# Patient Record
Sex: Male | Born: 1975 | Race: White | Hispanic: No | Marital: Married | Smoking: Never smoker
Health system: Southern US, Community
[De-identification: ages and names within clinical notes are randomized; demographics above are authoritative.]

## PROBLEM LIST (undated history)

## (undated) DIAGNOSIS — N200 Calculus of kidney: Secondary | ICD-10-CM

## (undated) DIAGNOSIS — F419 Anxiety disorder, unspecified: Secondary | ICD-10-CM

## (undated) DIAGNOSIS — F32A Depression, unspecified: Secondary | ICD-10-CM

## (undated) DIAGNOSIS — F329 Major depressive disorder, single episode, unspecified: Secondary | ICD-10-CM

## (undated) DIAGNOSIS — Z87442 Personal history of urinary calculi: Secondary | ICD-10-CM

## (undated) HISTORY — DX: Anxiety disorder, unspecified: F41.9

## (undated) HISTORY — DX: Major depressive disorder, single episode, unspecified: F32.9

## (undated) HISTORY — DX: Depression, unspecified: F32.A

---

## 2003-05-11 ENCOUNTER — Emergency Department (HOSPITAL_COMMUNITY): Admission: EM | Admit: 2003-05-11 | Discharge: 2003-05-11 | Payer: Self-pay | Admitting: Emergency Medicine

## 2005-07-17 ENCOUNTER — Ambulatory Visit: Payer: Self-pay | Admitting: Orthopedic Surgery

## 2007-07-12 ENCOUNTER — Other Ambulatory Visit: Payer: Self-pay

## 2007-07-13 ENCOUNTER — Other Ambulatory Visit: Payer: Self-pay

## 2007-07-13 ENCOUNTER — Inpatient Hospital Stay: Payer: Self-pay | Admitting: Unknown Physician Specialty

## 2008-04-03 ENCOUNTER — Inpatient Hospital Stay: Payer: Self-pay | Admitting: Psychiatry

## 2013-09-14 ENCOUNTER — Ambulatory Visit: Payer: Self-pay | Admitting: Gastroenterology

## 2014-08-01 ENCOUNTER — Encounter (HOSPITAL_COMMUNITY): Payer: Self-pay | Admitting: Emergency Medicine

## 2014-08-01 ENCOUNTER — Emergency Department (HOSPITAL_COMMUNITY)
Admission: EM | Admit: 2014-08-01 | Discharge: 2014-08-01 | Disposition: A | Discharge status: Home / Self Care | Payer: BLUE CROSS/BLUE SHIELD | Attending: Emergency Medicine | Admitting: Emergency Medicine

## 2014-08-01 ENCOUNTER — Emergency Department (HOSPITAL_COMMUNITY): Payer: BLUE CROSS/BLUE SHIELD

## 2014-08-01 DIAGNOSIS — N201 Calculus of ureter: Secondary | ICD-10-CM

## 2014-08-01 DIAGNOSIS — R109 Unspecified abdominal pain: Secondary | ICD-10-CM

## 2014-08-01 DIAGNOSIS — N23 Unspecified renal colic: Secondary | ICD-10-CM | POA: Diagnosis not present

## 2014-08-01 HISTORY — DX: Calculus of kidney: N20.0

## 2014-08-01 LAB — URINALYSIS, ROUTINE W REFLEX MICROSCOPIC
GLUCOSE, UA: NEGATIVE mg/dL
Nitrite: POSITIVE — AB
PROTEIN: 100 mg/dL — AB
UROBILINOGEN UA: 0.2 mg/dL (ref 0.0–1.0)
pH: 5 (ref 5.0–8.0)

## 2014-08-01 LAB — URINE MICROSCOPIC-ADD ON

## 2014-08-01 MED ORDER — TAMSULOSIN HCL 0.4 MG PO CAPS: 0.4000 mg | ORAL_CAPSULE | Freq: Every day | ORAL | Status: DC

## 2014-08-01 MED ORDER — ONDANSETRON HCL 4 MG PO TABS: 4.0000 mg | ORAL_TABLET | Freq: Three times a day (TID) | ORAL | Status: DC | PRN

## 2014-08-01 MED ORDER — OXYCODONE-ACETAMINOPHEN 5-325 MG PO TABS: ORAL_TABLET | ORAL | Status: DC

## 2014-08-01 MED ORDER — NAPROXEN 250 MG PO TABS: 250.0000 mg | ORAL_TABLET | Freq: Two times a day (BID) | ORAL | Status: DC | PRN

## 2014-08-01 NOTE — ED Provider Notes (Signed)
CSN: [id number]     Arrival date & time 08/01/14  1350 History   First MD Initiated Contact with Patient 08/01/14 1623     Chief Complaint  Patient presents with  . Flank Pain      HPI  Pt was seen at 1625. Per pt, c/o sudden onset and persistence of waxing and waning left sided flank "pain" that began 1 to 2 weeks ago, worse this afternoon PTA.  Pt describes the pain as "like my last kidney stone," and radiating into the left side of his abd.  Has been associated with "dark urine." Denies testicular pain/swelling, no dysuria/hematuria, no abd pain, no diarrhea, no CP/SOB.    Past Medical History  Diagnosis Date  . Kidney stones    History reviewed. No pertinent past surgical history.  History  Substance Use Topics  . Smoking status: Never Smoker   . Smokeless tobacco: Current User    Types: Snuff  . Alcohol Use: 2.4 oz/week    4 Cans of beer per week    Review of Systems ROS: Statement: All systems negative except as marked or noted in the HPI; Constitutional: Negative for fever and chills. ; ; Eyes: Negative for eye pain, redness and discharge. ; ; ENMT: Negative for ear pain, hoarseness, nasal congestion, sinus pressure and sore throat. ; ; Cardiovascular: Negative for chest pain, palpitations, diaphoresis, dyspnea and peripheral edema. ; ; Respiratory: Negative for cough, wheezing and stridor. ; ; Gastrointestinal: Negative for nausea, vomiting, diarrhea, abdominal pain, blood in stool, hematemesis, jaundice and rectal bleeding. . ; ; Genitourinary: +"dark urine." Negative for dysuria, flank pain and hematuria. ; ; Genital:  No penile drainage or rash, no testicular pain or swelling, no scrotal rash or swelling. ;; Musculoskeletal: +LBP. Negative for neck pain. Negative for swelling and trauma.; ; Skin: Negative for pruritus, rash, abrasions, blisters, bruising and skin lesion.; ; Neuro: Negative for headache, lightheadedness and neck stiffness. Negative for weakness, altered level of  consciousness , altered mental status, extremity weakness, paresthesias, involuntary movement, seizure and syncope.     Allergies  Review of patient's allergies indicates no known allergies.  Home Medications   Prior to Admission medications   Not on File   BP 117/85 mmHg  Pulse 83  Temp(Src) 98.2 F (36.8 C) (Oral)  Resp 20  Ht 5\' 11"  (1.803 m)  Wt 210 lb (95.255 kg)  BMI 29.30 kg/m2  SpO2 97% Physical Exam  1630: Physical examination:  Nursing notes reviewed; Vital signs and O2 SAT reviewed;  Constitutional: Well developed, Well nourished, Well hydrated, In no acute distress; Head:  Normocephalic, atraumatic; Eyes: EOMI, PERRL, No scleral icterus; ENMT: Mouth and pharynx normal, Mucous membranes moist; Neck: Supple, Full range of motion, No lymphadenopathy; Cardiovascular: Regular rate and rhythm, No murmur, rub, or gallop; Respiratory: Breath sounds clear & equal bilaterally, No rales, rhonchi, wheezes.  Speaking full sentences with ease, Normal respiratory effort/excursion; Chest: Nontender, Movement normal; Abdomen: Soft, Nontender, Nondistended, Normal bowel sounds; Genitourinary: No CVA tenderness; Spine:  No midline CS, TS, LS tenderness. No rash.;; Extremities: Pulses normal, No tenderness, No edema, No calf edema or asymmetry.; Neuro: AA&Ox3, Major CN grossly intact.  Speech clear. No gross focal motor or sensory deficits in extremities. Climbs on and off stretcher easily by himself. Gait steady.; Skin: Color normal, Warm, Dry.   ED Course  Procedures     EKG Interpretation None      MDM  MDM Reviewed: previous chart, nursing note and vitals Reviewed  previous: labs and CT scan Interpretation: CT scan and labs   Results for orders placed or performed during the hospital encounter of 08/01/14  Urinalysis, Routine w reflex microscopic (not at [facility])  Result Value Ref Range   Color, Urine AMBER (A) YELLOW   APPearance HAZY (A) CLEAR   Specific Gravity, Urine >1.030  (H) 1.005 - 1.030   pH 5.0 5.0 - 8.0   Glucose, UA NEGATIVE NEGATIVE mg/dL   Hgb urine dipstick LARGE (A) NEGATIVE   Bilirubin Urine SMALL (A) NEGATIVE   Ketones, ur TRACE (A) NEGATIVE mg/dL   Protein, ur 100 (A) NEGATIVE mg/dL   Urobilinogen, UA 0.2 0.0 - 1.0 mg/dL   Nitrite POSITIVE (A) NEGATIVE   Leukocytes, UA TRACE (A) NEGATIVE  Urine microscopic-add on  Result Value Ref Range   Squamous Epithelial / LPF RARE RARE   WBC, UA 3-6 <3 WBC/hpf   RBC / HPF TOO NUMEROUS TO COUNT <3 RBC/hpf   Bacteria, UA MANY (A) RARE   Ct Renal Stone Study 08/01/2014   CLINICAL DATA:  LEFT flank pain off and on for 1 week, question passed a kidney stone today, pain improved, history of multiple kidney stones  EXAM: CT ABDOMEN AND PELVIS WITHOUT CONTRAST  TECHNIQUE: Multidetector CT imaging of the abdomen and pelvis was performed following the standard protocol without IV contrast. Sagittal and coronal MPR images reconstructed from axial data set. Oral contrast not administered for this indication.  COMPARISON:  05/11/2003  FINDINGS: Lung bases clear.  BILATERAL tiny nonobstructing renal calculi.  No hydronephrosis or significant hydroureter.  LEFT ureter appears similar in caliber to the RIGHT though a 2 mm diameter distal LEFT ureteral calculus is identified image 80.  Bladder and remaining ureters unremarkable.  Within limits of a nonenhanced exam no additional abnormalities of the liver, gallbladder, spleen, pancreas, kidneys, or adrenal glands.  Normal appendix.  Mild sigmoid diverticulosis without evidence of diverticulitis.  Stomach and bowel loops otherwise unremarkable for technique.  No mass, adenopathy, free air, free fluid, or acute osseous findings.  IMPRESSION: 2 mm distal LEFT ureteral calculus without significant hydroureteronephrosis.  Additional tiny BILATERAL nonobstructing renal calculi.  Mild sigmoid diverticulosis.   Electronically Signed   By: Lavonia Dana M.D.   On: 08/01/2014 17:20    1740:   Pt continues comfortable while in the ED. Denies need for pain med. Tx symptomatically at this time. Dx and testing d/w pt.  Questions answered.  Verb understanding, agreeable to d/c home with outpt f/u.    Francine Graven, DO 08/04/14 1523

## 2014-08-01 NOTE — Discharge Instructions (Signed)
°Emergency Department Resource Guide °1) Find a Doctor and Pay Out of Pocket °Although you won't have to find out who is covered by your insurance plan, it is a good idea to ask around and get recommendations. You will then need to call the office and see if the doctor you have chosen will accept you as a new patient and what types of options they offer for patients who are self-pay. Some doctors offer discounts or will set up payment plans for their patients who do not have insurance, but you will need to ask so you aren't surprised when you get to your appointment. ° °2) Contact Your Local Health Department °Not all health departments have doctors that can see patients for sick visits, but many do, so it is worth a call to see if yours does. If you don't know where your local health department is, you can check in your phone book. The CDC also has a tool to help you locate your state's health department, and many state websites also have listings of all of their local health departments. ° °3) Find a Walk-in Clinic °If your illness is not likely to be very severe or complicated, you may want to try a walk in clinic. These are popping up all over the country in pharmacies, drugstores, and shopping centers. They're usually staffed by nurse practitioners or physician assistants that have been trained to treat common illnesses and complaints. They're usually fairly quick and inexpensive. However, if you have serious medical issues or chronic medical problems, these are probably not your best option. ° °No Primary Care Doctor: °- Call [facility] at  832-8000 - they can help you locate a primary care doctor that  accepts your insurance, provides certain services, etc. °- Physician Referral Service- 1-800-533-3463 ° °Chronic Pain Problems: °Organization         Address  Phone   Notes  °[facility]  (336) [phone] Patients need to be referred by their primary care doctor.  ° °Medication  Assistance: °Organization         Address  Phone   Notes  °[place] [facility] [address] °[place], [place] [address] (336) 641-8030 --Must be a resident of [place] °-- Must have NO insurance coverage whatsoever (no Medicaid/ Medicare, etc.) °-- The pt. MUST have a primary care doctor that directs their care regularly and follows them in the community °  °[facility]  (866) 331-1348   °[employer]  (888) 892-1162   ° °Agencies that provide inexpensive medical care: °Organization         Address  Phone   Notes  °[facility]  (336) 832-8035   °[facility]    (336) 832-7272   °[facility] [address] °[place], [place] [address] (336) 832-4777   °[facility] [address], °[place] (336) 271-4999   °[facility]    (336) 373-0678   °[facility]    (336) 272-1050   °[facility] ° [address], [place] Phone:  (336) 832-4444, Fax:  (336) 832-4440 Hours of Operation:  9 am - 6 pm, M-F.  Also accepts Medicaid/Medicare and self-pay.  °[facility] ° [address], [place] Phone: (336) [phone], Fax: (336) [phone]. Hours of Operation:  8:30 am - 5:30 pm, M-F.  Also accepts Medicaid and self-pay.  °[facility] [place] [address], [place] Phone: (336) 878-6027   °[facility] [address], [place], [place] (336)723-1848, Ext. 123 Mondays & Thursdays: 7-9 AM.  First 15 patients are seen on a first come, first serve basis. °  ° °Medicaid-accepting [place] Providers: ° °Organization         Address  Phone   Notes  °[facility] [address], [place] (336) 641-2100 Also accepts self-pay patients.  °[facility] [address], [place] ° (336) [phone]   °[facility] [address], [place]  (336) 288-8857   °[facility] [address] [place] [address], [place] (336) 299-7000   °Veita Bland [address], [place]  ° (336) [phone] Only accepts [place] Access Medicaid patients after they have their name applied to their card.  ° °Self-Pay (no insurance) in [place]: ° °Organization         Address  Phone   Notes  °Sickle Cell Patients, [facility] [address], [place] (336) 832-1970   °[facility] [address], [place] (336) 832-4400   °[facility] ° [address], [place] (336) 992-4800   °[facility]/Dr. Osei-Bonsu ° [address] [place] [address], [place] or [address], [place] (336) [phone] Phone number for both [place] and [place] locations is the same.  °[facility] [address], [place] (336) 299-0000   °[facility] [address] [place] [address], [place] or [address] (336) 852-7530 °(336) 878-2260   °[facility] [address], [place] (336) 350-1642, phone; (336) 294-5005, fax Sees patients 1st and 3rd Saturday of every month.  Must not qualify for public or private insurance (i.e. Medicaid, Medicare, [place] Health Choice, Veterans' Benefits) • Household income should be no more than 200% of the poverty level •The clinic cannot treat you if you are pregnant or think you are pregnant • Sexually transmitted diseases are not treated at the clinic.  ° ° °Dental Care: °Organization         Address  Phone  Notes  °[place] [facility] [address], [place] (336) 641-6152 Accepts children up to age 21 who are enrolled in Medicaid or [place] Health Choice; pregnant women with a Medicaid card; and children who have applied for Medicaid or [place] Health Choice, but were declined, whose parents can pay a reduced fee at time of service.  °[place]  [facility] [place]  [address], [place] (336) 641-7733 Accepts children up to age 21 who are enrolled in Medicaid or [place] Health Choice; pregnant women with a Medicaid card; and children who have applied for Medicaid or [place] Health Choice, but were declined, whose parents can pay a reduced fee at time of service.  °[facility] ° [address], [place] (336) 641-4533 Patients are seen by appointment only. Walk-ins are not accepted. [facility] will see patients 18 years of age and older. °Monday - Tuesday (8am-5pm) °Most Wednesdays (8:30-5pm) °$30 per visit, cash only  °[facility] ° [address], [place] (336) [phone] Patients are seen by appointment only. Walk-ins are not accepted. [facility] will see patients 18 years of age and older. °One   Wednesday Evening (Monthly: Volunteer Based).  $30 per visit, cash only  °[facility]  (919) [phone] for adults; Children under age 4, call Graduate Pediatric Dentistry at ([phone]) [phone]. Children aged 4-14, please call ([phone]) [phone] to request a pediatric application. ° Dental services are provided in all areas of dental care including fillings, crowns and bridges, complete and partial dentures, implants, gum treatment, root canals, and extractions. Preventive care is also provided. Treatment is provided to both adults and children. °Patients are selected via a lottery and there is often a waiting list. °  °[facility] [address], °[place] ° (336) 763-8833 [email or web address] °  °[facility] [address], [place], [place] (336)723-1848, Ext. 123 Second and Fourth Thursday of each month, opens at 6:30 AM; Clinic ends at 9 AM.  Patients are seen on a first-come first-served basis, and a limited number are seen during each clinic.  ° °[facility] ° [address] [place], [place], [place] (336) [phone]    Eligibility Requirements °You must have lived in [place], or [place] counties for at least the last three months. °  You cannot be eligible for state or federal sponsored healthcare insurance, including Veterans Administration, Medicaid, or Medicare. °  You generally cannot be eligible for healthcare insurance through your employer.  °  How to apply: °Eligibility screenings are held every Tuesday and Wednesday afternoon from 1:00 pm until 4:00 pm. You do not need an appointment for the interview!  °[facility] [address], [place], [place] 336-631-2330   °[facility]  336-342-8273   °[facility]  336-703-3100   °[facility]  336-570-6415   ° °Behavioral Health Resources in the Community: °Intensive Outpatient Programs °Organization         Address  Phone  Notes  °[facility] [address], [place], [place] 336-878-6098   °[facility] [address], [place], [place] 336-832-9800   °[facility] [address], [place], [place] ° 336-882-2125   °[facility] [address],  °[place], [place] 1-800-853-5163 or 336-641-4981   °Substance Abuse Resources °Organization         Address  Phone  Notes  °[facility]  336-882-2125   °[facility]  336-784-9470   °[facility]  336-285-9073   °[facility]  336-845-3988   °Residential & Outpatient Substance Abuse Program  1-800-659-3381   °Psychological Services °Organization         Address  Phone  Notes  °[facility]  336- 832-9600   °[facility]  336- 378-7881   °[facility] [address], [place] 1-800-853-5163 or 336-641-4981   ° °Mobile Crisis Teams °Organization         Address  Phone  Notes  °[facility], Mobile Crisis Care Unit  1-877-626-1772   °Assertive °[facility] ° [address]  [place], [place] 336-834-9664   °Sharon DeEsch [address] °[place] 336-554-5454   ° °Self-Help/Support Groups °Organization         Address  Phone             Notes  °[facility] [place] - variety of support groups  336- 373-1402 Call for more information  °Narcotics Anonymous (NA), [facility] [address], °[place]  2 meetings at this location  ° °  Residential Treatment Programs Organization         Address  Phone  Notes  [facility] [address]    [place]  1-8724299025   [facility]  [address], [place] [address], [place], [place]   [place], [place] (934)106-9835 Admissions: 8am-3pm M-F  [facility] [place] [address],    [place], [place] 808-811-0315   [facility] [address] [place], [place], [place]   [facility] [address]  [place], [place]   [facility] - Intensive Outpatient [address], [place], [place]   [facility] [address]  [place], [place] 1-(669)888-6437 or 603-522-1420   [facility] [address] [place], [place] Accepts Medicaid  [facility] [address]  [place] 1-(250) 766-5578 Substance Abuse/Addiction Treatment   [facility]         Address  Phone  Notes  [facility]  617-387-0899   Domenic Schwab, PhD [address] [place], [place]   780 654 2741 or [phone]   [facility] [place], [place] (424)486-6524   [facility] [address], [place], [place] 867-429-1537 Insurance/Medicaid/sponsorship through [facility] [address]                                    [place], [place] (507) [phone] [facility] [address], [place] [phone]    Dr. Adele Schilder  [phone]   [facility] Dept. 1) [address], [place] 2) [place] 3)  [place] [address], [place] ([phone]) [phone]  [phone]   [place] (206)[phone] or ([phone] (After Hours)      Take the prescriptions as directed. Call the Urologist tomorrow to schedule a follow up appointment this week.  Return to the Emergency Department immediately if worsening.

## 2014-08-01 NOTE — ED Notes (Signed)
Patient with no complaints at this time. Respirations even and unlabored. Skin warm/dry. Discharge instructions reviewed with patient at this time. Patient given opportunity to voice concerns/ask questions. Patient discharged at this time and left Emergency Department with steady gait.   

## 2014-08-01 NOTE — ED Notes (Signed)
Having left flank pain for last 7-8 days.  Rates pain 8/10, did not take any pain medication.  Having difficulty voiding.

## 2014-08-18 ENCOUNTER — Emergency Department (HOSPITAL_COMMUNITY): Payer: BLUE CROSS/BLUE SHIELD

## 2014-08-18 ENCOUNTER — Encounter (HOSPITAL_COMMUNITY): Payer: Self-pay | Admitting: Emergency Medicine

## 2014-08-18 ENCOUNTER — Emergency Department (HOSPITAL_COMMUNITY)
Admission: EM | Admit: 2014-08-18 | Discharge: 2014-08-18 | Disposition: A | Discharge status: Home / Self Care | Payer: BLUE CROSS/BLUE SHIELD | Attending: Emergency Medicine | Admitting: Emergency Medicine

## 2014-08-18 DIAGNOSIS — Z79899 Other long term (current) drug therapy: Secondary | ICD-10-CM | POA: Insufficient documentation

## 2014-08-18 DIAGNOSIS — N390 Urinary tract infection, site not specified: Secondary | ICD-10-CM | POA: Diagnosis not present

## 2014-08-18 DIAGNOSIS — Z87442 Personal history of urinary calculi: Secondary | ICD-10-CM | POA: Diagnosis not present

## 2014-08-18 DIAGNOSIS — R1032 Left lower quadrant pain: Secondary | ICD-10-CM | POA: Diagnosis present

## 2014-08-18 DIAGNOSIS — R109 Unspecified abdominal pain: Secondary | ICD-10-CM

## 2014-08-18 LAB — I-STAT CHEM 8, ED
BUN: 27 mg/dL — ABNORMAL HIGH (ref 6–20)
Calcium, Ion: 1.16 mmol/L (ref 1.12–1.23)
Chloride: 101 mmol/L (ref 101–111)
Creatinine, Ser: 1.3 mg/dL — ABNORMAL HIGH (ref 0.61–1.24)
Glucose, Bld: 109 mg/dL — ABNORMAL HIGH (ref 65–99)
HCT: 41 % (ref 39.0–52.0)
Hemoglobin: 13.9 g/dL (ref 13.0–17.0)
Potassium: 4.5 mmol/L (ref 3.5–5.1)
Sodium: 140 mmol/L (ref 135–145)
TCO2: 28 mmol/L (ref 0–100)

## 2014-08-18 LAB — URINALYSIS, ROUTINE W REFLEX MICROSCOPIC
Bilirubin Urine: NEGATIVE
Glucose, UA: NEGATIVE mg/dL
Ketones, ur: NEGATIVE mg/dL
Leukocytes, UA: NEGATIVE
Nitrite: NEGATIVE
Specific Gravity, Urine: >1.03 — ABNORMAL HIGH (ref 1.005–1.030)
Urobilinogen, UA: 0.2 mg/dL (ref 0.0–1.0)
pH: 5.5 (ref 5.0–8.0)

## 2014-08-18 LAB — URINE MICROSCOPIC-ADD ON

## 2014-08-18 MED ORDER — KETOROLAC TROMETHAMINE 30 MG/ML IJ SOLN
15.0000 mg | Freq: Once | INTRAMUSCULAR | Status: AC
  Administered 2014-08-18: 15 mg via INTRAVENOUS
  Filled 2014-08-18: qty 1

## 2014-08-18 MED ORDER — OXYCODONE-ACETAMINOPHEN 5-325 MG PO TABS: 1.0000 | ORAL_TABLET | ORAL | Status: DC | PRN

## 2014-08-18 MED ORDER — HYDROMORPHONE HCL 1 MG/ML IJ SOLN
1.0000 mg | Freq: Once | INTRAMUSCULAR | Status: AC
  Administered 2014-08-18: 1 mg via INTRAVENOUS
  Filled 2014-08-18: qty 1

## 2014-08-18 MED ORDER — MORPHINE SULFATE 4 MG/ML IJ SOLN
10.0000 mg | Freq: Once | INTRAMUSCULAR | Status: AC
  Administered 2014-08-18: 10 mg via INTRAVENOUS
  Filled 2014-08-18: qty 3

## 2014-08-18 MED ORDER — MORPHINE SULFATE 4 MG/ML IJ SOLN
INTRAMUSCULAR | Status: AC
  Filled 2014-08-18: qty 1

## 2014-08-18 MED ORDER — SODIUM CHLORIDE 0.9 % IV BOLUS (SEPSIS)
1000.0000 mL | Freq: Once | INTRAVENOUS | Status: AC
  Administered 2014-08-18: 1000 mL via INTRAVENOUS

## 2014-08-18 MED ORDER — ONDANSETRON HCL 4 MG/2ML IJ SOLN
4.0000 mg | Freq: Once | INTRAMUSCULAR | Status: AC
  Administered 2014-08-18: 4 mg via INTRAMUSCULAR
  Filled 2014-08-18: qty 2

## 2014-08-18 MED ORDER — SODIUM CHLORIDE 0.9 % IV BOLUS (SEPSIS)
1000.0000 mL | Freq: Once | INTRAVENOUS | Status: AC
Start: 2014-08-18 — End: 2014-08-18
  Administered 2014-08-18: 1000 mL via INTRAVENOUS

## 2014-08-18 MED ORDER — HYDROMORPHONE HCL 1 MG/ML IJ SOLN
INTRAMUSCULAR | Status: AC
  Filled 2014-08-18: qty 1

## 2014-08-18 MED ORDER — HYDROMORPHONE HCL 1 MG/ML IJ SOLN
1.0000 mg | Freq: Once | INTRAMUSCULAR | Status: AC
  Administered 2014-08-18: 1 mg via INTRAVENOUS

## 2014-08-18 MED ORDER — CEPHALEXIN 500 MG PO CAPS: 500.0000 mg | ORAL_CAPSULE | Freq: Four times a day (QID) | ORAL | Status: DC

## 2014-08-18 MED ORDER — FENTANYL CITRATE (PF) 100 MCG/2ML IJ SOLN
125.0000 ug | Freq: Once | INTRAMUSCULAR | Status: AC
  Administered 2014-08-18: 125 ug via INTRAVENOUS
  Filled 2014-08-18: qty 4

## 2014-08-18 MED ORDER — DEXTROSE 5 % IV SOLN
1.0000 g | Freq: Once | INTRAVENOUS | Status: AC
  Administered 2014-08-18: 1 g via INTRAVENOUS
  Filled 2014-08-18: qty 10

## 2014-08-18 NOTE — ED Notes (Signed)
Pt c.o left flank pain radiating to abd and groin since yesterday. N/v x 1 this am. History of kidney stones.

## 2014-08-18 NOTE — Discharge Instructions (Signed)
Flank Pain °Flank pain refers to pain that is located on the side of the body between the upper abdomen and the back. The pain may occur over a short period of time (acute) or may be long-term or reoccurring (chronic). It may be mild or severe. Flank pain can be caused by many things. °CAUSES  °Some of the more common causes of flank pain include: °· Muscle strains.   °· Muscle spasms.   °· A disease of your spine (vertebral disk disease).   °· A lung infection (pneumonia).   °· Fluid around your lungs (pulmonary edema).   °· A kidney infection.   °· Kidney stones.   °· A very painful skin rash caused by the chickenpox virus (shingles).   °· Gallbladder disease.   °HOME CARE INSTRUCTIONS  °Home care will depend on the cause of your pain. In general, °· Rest as directed by your caregiver. °· Drink enough fluids to keep your urine clear or pale yellow. °· Only take over-the-counter or prescription medicines as directed by your caregiver. Some medicines may help relieve the pain. °· Tell your caregiver about any changes in your pain. °· Follow up with your caregiver as directed. °SEEK IMMEDIATE MEDICAL CARE IF:  °· Your pain is not controlled with medicine.   °· You have new or worsening symptoms. °· Your pain increases.   °· You have abdominal pain.   °· You have shortness of breath.   °· You have persistent nausea or vomiting.   °· You have swelling in your abdomen.   °· You feel faint or pass out.   °· You have blood in your urine. °· You have a fever or persistent symptoms for more than 2-3 days. °· You have a fever and your symptoms suddenly get worse. °MAKE SURE YOU:  °· Understand these instructions. °· Will watch your condition. °· Will get help right away if you are not doing well or get worse. °Document Released: 04/04/2005 Document Revised: 11/06/2011 Document Reviewed: 09/26/2011 °ExitCare® Patient Information ©2015 [employer]. This information is not intended to replace advice given to you by your  health care provider. Make sure you discuss any questions you have with your health care provider. ° °

## 2014-08-18 NOTE — ED Notes (Signed)
Pt requesting to speak to MD Kohut. MD Wilson Singer made aware.

## 2014-08-18 NOTE — ED Provider Notes (Signed)
CSN: [id number]     Arrival date & time 08/18/14  0711 History   First MD Initiated Contact with Patient 08/18/14 [phone]     No chief complaint on file.    (Consider location/radiation/quality/duration/timing/severity/associated sxs/prior Treatment) HPI   39 year old male with left flank pain. Acute onset yesterday. Intermittent. Pain is sharp. Radiates the left groin. Nausea and did vomit once this morning. Past history kidney stones and states that current symptoms feel similar. Patient was seen in the emergency room on June 6. His CT scan which showed a ureteral stone at that time as well as evidence of renal stones. Patient states that his symptoms then resolved until he began having pain again yesterday. His specific urinary complaints. No fevers or chills.  Past Medical History  Diagnosis Date  . Kidney stones    No past surgical history on file. No family history on file. History  Substance Use Topics  . Smoking status: Never Smoker   . Smokeless tobacco: Current User    Types: Snuff  . Alcohol Use: 2.4 oz/week    4 Cans of beer per week    Review of Systems  All systems reviewed and negative, other than as noted in HPI.   Allergies  Review of patient's allergies indicates no known allergies.  Home Medications   Prior to Admission medications   Medication Sig Start Date End Date Taking? Authorizing Provider  Aspirin-Salicylamide-Caffeine 119-14-78 MG TABS Take 1 packet by mouth once as needed (for headache).     Historical Provider, MD  clonazePAM (KLONOPIN) 0.5 MG tablet Take 0.5 mg by mouth 2 (two) times daily. 05/08/14   Historical Provider, MD  naproxen (NAPROSYN) 250 MG tablet Take 1 tablet (250 mg total) by mouth 2 (two) times daily as needed for mild pain or moderate pain (take with food). 08/01/14   Francine Graven, DO  NUVIGIL 250 MG tablet Take 250 mg by mouth every morning. 07/01/14   Historical Provider, MD  OLANZapine-FLUoxetine (SYMBYAX) 3-25 MG per capsule  Take 1 capsule by mouth at bedtime.  05/07/14   Historical Provider, MD  ondansetron (ZOFRAN) 4 MG tablet Take 1 tablet (4 mg total) by mouth every 8 (eight) hours as needed for nausea or vomiting. 08/01/14   Francine Graven, DO  oxyCODONE-acetaminophen (PERCOCET/ROXICET) 5-325 MG per tablet 1 or 2 tabs PO q6h prn pain 08/01/14   Francine Graven, DO  tamsulosin (FLOMAX) 0.4 MG CAPS capsule Take 1 capsule (0.4 mg total) by mouth at bedtime. 08/01/14   Francine Graven, DO   There were no vitals taken for this visit. Physical Exam  Constitutional: He appears well-developed and well-nourished.  Laying in bed moaning and rocking back and forth.   HENT:  Head: Normocephalic and atraumatic.  Eyes: Conjunctivae are normal. Right eye exhibits no discharge. Left eye exhibits no discharge.  Neck: Neck supple.  Cardiovascular: Normal rate, regular rhythm and normal heart sounds.  Exam reveals no gallop and no friction rub.   No murmur heard. Pulmonary/Chest: Effort normal and breath sounds normal. No respiratory distress.  Abdominal: Soft. He exhibits no distension. There is tenderness. There is no rebound and no guarding.  L sided tenderness extending to L flank. L CVA tenderness.   Musculoskeletal: He exhibits no edema or tenderness.  Neurological: He is alert.  Skin: Skin is warm and dry.  Psychiatric: He has a normal mood and affect. His behavior is normal. Thought content normal.  Nursing note and vitals reviewed.   ED Course  Procedures (  including critical care time) Labs Review Labs Reviewed  URINALYSIS, ROUTINE W REFLEX MICROSCOPIC (NOT AT [facility]) - Abnormal; Notable for the following:    Specific Gravity, Urine >1.030 (*)    Hgb urine dipstick LARGE (*)    Protein, ur TRACE (*)    All other components within normal limits  URINE MICROSCOPIC-ADD ON - Abnormal; Notable for the following:    Bacteria, UA MANY (*)    All other components within normal limits  I-STAT CHEM 8, ED - Abnormal;  Notable for the following:    BUN 27 (*)    Creatinine, Ser 1.30 (*)    Glucose, Bld 109 (*)    All other components within normal limits  URINE CULTURE    Imaging Review US Renal  08/18/2014   CLINICAL DATA:  Kidney stones.  Left flank pain.  EXAM: RENAL / URINARY TRACT ULTRASOUND COMPLETE  COMPARISON:  CT 08/01/2014.  FINDINGS: Right Kidney:  Length: 12.6 cm. Echogenicity within normal limits. No mass or hydronephrosis visualized. Stable tiny mid right renal calyceal nonobstructing stone.  Left Kidney:  Length: 13.1 cm. Echogenicity within normal limits. No mass or hydronephrosis visualized. Questionable tiny nonobstructing left renal calyceal stone.  Bladder:  Patient voided.  IMPRESSION: 1.  Single tiny bilateral nonobstructing renal calyceal stones.  2.  Exam otherwise unremarkable.  No hydronephrosis.   Electronically Signed   By: Marcello Moores  Register   On: 08/18/2014 10:16     EKG Interpretation None      MDM   Final diagnoses:  None    39 year old male with left leg pain. Acute onset this morning. He appears uncomfortable in bed consistent with the appearance of a patient with ureteral colic. Has a past history of stones. He was evaluated on June 6 of this year and had a CT of the abdomen and pelvis. At that time he had a 2 mm left ureteral calculus. Additional bilateral calculi were noted in the kidneys.   Treated presumptively for ureteral colic today. Difficulty controlling patient's symptoms. Urinalysis shows hematuria. Many bacteria. Consider pyelonephritis. Will send urine culture. Rocephin. Mild renal unsufficiency. No labs for comparison.  Does have tenderness on exam which suggests he may have some degree of obstruction if indeed ureteral colic. Will [place].    CT a/p non-contrast from 08/01/2014, IMPRESSION:  2 mm distal LEFT ureteral calculus without significant Hydroureteronephrosis. Ultrasound without evidence of hydronephrosis.  Additional tiny BILATERAL nonobstructing  renal calculi.  Mild sigmoid diverticulosis.   Ultrasound today without evidence for hydronephrosis. Symptoms now much improved. Feel appropriate for further outpatient treatment. Return precautions were discussed.    Virgel Manifold, MD 08/24/14 [phone]

## 2014-08-18 NOTE — ED Notes (Signed)
MD Kohut at bedside. 

## 2014-08-19 LAB — URINE CULTURE: Culture: NO GROWTH

## 2015-03-29 HISTORY — PX: TOE AMPUTATION: SHX809

## 2015-04-18 ENCOUNTER — Encounter (HOSPITAL_COMMUNITY): Payer: Self-pay

## 2015-04-18 ENCOUNTER — Emergency Department (HOSPITAL_COMMUNITY)
Admission: EM | Admit: 2015-04-18 | Discharge: 2015-04-18 | Disposition: A | Discharge status: Home / Self Care | Payer: BLUE CROSS/BLUE SHIELD | Attending: Emergency Medicine | Admitting: Emergency Medicine

## 2015-04-18 DIAGNOSIS — Z87442 Personal history of urinary calculi: Secondary | ICD-10-CM | POA: Diagnosis not present

## 2015-04-18 DIAGNOSIS — R109 Unspecified abdominal pain: Secondary | ICD-10-CM | POA: Diagnosis present

## 2015-04-18 DIAGNOSIS — F419 Anxiety disorder, unspecified: Secondary | ICD-10-CM | POA: Insufficient documentation

## 2015-04-18 DIAGNOSIS — Z79899 Other long term (current) drug therapy: Secondary | ICD-10-CM | POA: Insufficient documentation

## 2015-04-18 DIAGNOSIS — R110 Nausea: Secondary | ICD-10-CM | POA: Insufficient documentation

## 2015-04-18 DIAGNOSIS — Z792 Long term (current) use of antibiotics: Secondary | ICD-10-CM | POA: Diagnosis not present

## 2015-04-18 DIAGNOSIS — R319 Hematuria, unspecified: Secondary | ICD-10-CM | POA: Diagnosis not present

## 2015-04-18 LAB — CBC WITH DIFFERENTIAL/PLATELET
BASOS PCT: 0 %
Basophils Absolute: 0 10*3/uL (ref 0.0–0.1)
EOS ABS: 0.1 10*3/uL (ref 0.0–0.7)
Eosinophils Relative: 1 %
HCT: 40.2 % (ref 39.0–52.0)
HEMOGLOBIN: 14.5 g/dL (ref 13.0–17.0)
Lymphocytes Relative: 17 %
Lymphs Abs: 1.6 10*3/uL (ref 0.7–4.0)
MCH: 32.2 pg (ref 26.0–34.0)
MCHC: 36.1 g/dL — ABNORMAL HIGH (ref 30.0–36.0)
MCV: 89.1 fL (ref 78.0–100.0)
Monocytes Absolute: 0.8 10*3/uL (ref 0.1–1.0)
Monocytes Relative: 8 %
NEUTROS PCT: 74 %
Neutro Abs: 7.1 10*3/uL (ref 1.7–7.7)
Platelets: 182 10*3/uL (ref 150–400)
RBC: 4.51 MIL/uL (ref 4.22–5.81)
RDW: 12.3 % (ref 11.5–15.5)
WBC: 9.7 10*3/uL (ref 4.0–10.5)

## 2015-04-18 LAB — BASIC METABOLIC PANEL
ANION GAP: 11 (ref 5–15)
BUN: 23 mg/dL — ABNORMAL HIGH (ref 6–20)
CHLORIDE: 103 mmol/L (ref 101–111)
CO2: 27 mmol/L (ref 22–32)
Calcium: 9.1 mg/dL (ref 8.9–10.3)
Creatinine, Ser: 1.29 mg/dL — ABNORMAL HIGH (ref 0.61–1.24)
GFR calc non Af Amer: >60 mL/min (ref >60)
Glucose, Bld: 99 mg/dL (ref 65–99)
POTASSIUM: 3.7 mmol/L (ref 3.5–5.1)
SODIUM: 141 mmol/L (ref 135–145)

## 2015-04-18 LAB — URINALYSIS, ROUTINE W REFLEX MICROSCOPIC
Glucose, UA: NEGATIVE mg/dL
Ketones, ur: NEGATIVE mg/dL
Leukocytes, UA: NEGATIVE
NITRITE: NEGATIVE
Protein, ur: 30 mg/dL — AB
pH: 5 (ref 5.0–8.0)

## 2015-04-18 LAB — URINE MICROSCOPIC-ADD ON

## 2015-04-18 MED ORDER — OXYCODONE-ACETAMINOPHEN 5-325 MG PO TABS: 1.0000 | ORAL_TABLET | ORAL | Status: DC | PRN

## 2015-04-18 MED ORDER — HYDROMORPHONE HCL 1 MG/ML IJ SOLN
1.0000 mg | Freq: Once | INTRAMUSCULAR | Status: AC
  Administered 2015-04-18: 1 mg via INTRAVENOUS
  Filled 2015-04-18: qty 1

## 2015-04-18 MED ORDER — ONDANSETRON HCL 4 MG/2ML IJ SOLN
4.0000 mg | Freq: Once | INTRAMUSCULAR | Status: AC
  Administered 2015-04-18: 4 mg via INTRAVENOUS
  Filled 2015-04-18: qty 2

## 2015-04-18 NOTE — ED Provider Notes (Signed)
CSN: [id number]     Arrival date & time 04/18/15  1828 History   First MD Initiated Contact with Patient 04/18/15 1920     Chief Complaint  Patient presents with  . Flank Pain    Patient is a 40 y.o. male presenting with flank pain. The history is provided by the patient.  Flank Pain This is a recurrent problem. The current episode started 3 to 5 hours ago. The problem occurs constantly. The problem has been gradually worsening. Associated symptoms include abdominal pain. Pertinent negatives include no chest pain and no shortness of breath. Exacerbated by: movement. Nothing relieves the symptoms.   Pt reports long h/o kidney stones that usually resolve spontaneously without intervention He reports this episode started earlier today, it is in right flank and radiates into right abdomen He denies any recent urologic procedures for stones Past Medical History  Diagnosis Date  . Kidney stones    History reviewed. No pertinent past surgical history. No family history on file. Social History  Substance Use Topics  . Smoking status: Never Smoker   . Smokeless tobacco: Current User    Types: Snuff  . Alcohol Use: 2.4 oz/week    4 Cans of beer per week     Comment: occ    Review of Systems  Constitutional: Negative for fever.  Respiratory: Negative for shortness of breath.   Cardiovascular: Negative for chest pain.  Gastrointestinal: Positive for nausea and abdominal pain. Negative for vomiting.  Genitourinary: Positive for flank pain.  All other systems reviewed and are negative.     Allergies  Review of patient's allergies indicates no known allergies.  Home Medications   Prior to Admission medications   Medication Sig Start Date End Date Taking? Authorizing Provider  cephALEXin (KEFLEX) 500 MG capsule Take 1 capsule (500 mg total) by mouth 4 (four) times daily. 08/18/14   Virgel Manifold, MD  clonazePAM (KLONOPIN) 0.5 MG tablet Take 0.5 mg by mouth 2 (two) times daily. 05/08/14    Historical Provider, MD  naproxen (NAPROSYN) 250 MG tablet Take 1 tablet (250 mg total) by mouth 2 (two) times daily as needed for mild pain or moderate pain (take with food). 08/01/14   Francine Graven, DO  NUVIGIL 250 MG tablet Take 250 mg by mouth every morning. 07/01/14   Historical Provider, MD  OLANZapine-FLUoxetine (SYMBYAX) 3-25 MG per capsule Take 1 capsule by mouth at bedtime.  05/07/14   Historical Provider, MD  ondansetron (ZOFRAN) 4 MG tablet Take 1 tablet (4 mg total) by mouth every 8 (eight) hours as needed for nausea or vomiting. 08/01/14   Francine Graven, DO  oxyCODONE-acetaminophen (PERCOCET/ROXICET) 5-325 MG per tablet 1 or 2 tabs PO q6h prn pain 08/01/14   Francine Graven, DO  oxyCODONE-acetaminophen (PERCOCET/ROXICET) 5-325 MG per tablet Take 1-2 tablets by mouth every 4 (four) hours as needed for severe pain. 08/18/14   Virgel Manifold, MD  tamsulosin (FLOMAX) 0.4 MG CAPS capsule Take 1 capsule (0.4 mg total) by mouth at bedtime. 08/01/14   Francine Graven, DO   BP 137/92 mmHg  Pulse 84  Temp(Src) 98 F (36.7 C) (Tympanic)  Resp 22  Ht 5\' 10"  (1.778 m)  Wt 97.523 kg  BMI 30.85 kg/m2  SpO2 100% Physical Exam CONSTITUTIONAL: Anxious and uncomfortable appearing HEAD: Normocephalic/atraumatic EYES: EOMI ENMT: Mucous membranes moist NECK: supple no meningeal signs SPINE/BACK:entire spine nontender CV: S1/S2 noted, no murmurs/rubs/gallops noted LUNGS: Lungs are clear to auscultation bilaterally, no apparent distress ABDOMEN: soft, nontender, no  rebound or guarding [id number] cva tenderness NEURO: Pt is awake/alert/appropriate, moves all extremitiesx4.  No facial droop.   EXTREMITIES: pulses normal/equal, full ROM SKIN: warm, color normal PSYCH: anxious  ED Course  Procedures  Labs Review Labs Reviewed  CBC WITH DIFFERENTIAL/PLATELET - Abnormal; Notable for the following:    MCHC 36.1 (*)    All other components within normal limits  BASIC METABOLIC PANEL - Abnormal;  Notable for the following:    BUN 23 (*)    Creatinine, Ser 1.29 (*)    All other components within normal limits  URINALYSIS, ROUTINE W REFLEX MICROSCOPIC (NOT AT [facility]) - Abnormal; Notable for the following:    Specific Gravity, Urine >1.030 (*)    Hgb urine dipstick LARGE (*)    Bilirubin Urine SMALL (*)    Protein, ur 30 (*)    All other components within normal limits  URINE MICROSCOPIC-ADD ON - Abnormal; Notable for the following:    Squamous Epithelial / LPF 0-5 (*)    Bacteria, UA MANY (*)    All other components within normal limits  URINE CULTURE    I have personally reviewed and evaluated these lab results as part of my medical decision-making.   Medications  HYDROmorphone (DILAUDID) injection 1 mg (1 mg Intravenous Given 04/18/15 1938)  ondansetron (ZOFRAN) injection 4 mg (4 mg Intravenous Given 04/18/15 1938)  HYDROmorphone (DILAUDID) injection 1 mg (1 mg Intravenous Given 04/18/15 2018)   9:04 PM Pt improved He reports it feels as though the "Stone has moved" He is in no distress He would like to go home Suspect ureteral stone given history/exam BP 136/97 mmHg  Pulse 71  Temp(Src) 98 F (36.7 C) (Tympanic)  Resp 18  Ht 5\' 10"  (1.778 m)  Wt 97.523 kg  BMI 30.85 kg/m2  SpO2 95%   MDM   Final diagnoses:  Flank pain  Hematuria    Nursing notes including past medical history and social history reviewed and considered in documentation Labs/vital reviewed myself and considered during evaluation     Ripley Fraise, MD 04/18/15 2105

## 2015-04-18 NOTE — ED Notes (Signed)
Pt states understanding of care given and follow up instructions.  Ambulated from ED.  Provided pt with urine strainer

## 2015-04-18 NOTE — ED Notes (Signed)
Pt reports r flank pain radiating around to lower abd since around 3pm today.  Denies any n/v at this time.  Pt says he took 2 oxycodones at 3pm that he had from a year ago.

## 2015-04-20 LAB — URINE CULTURE: CULTURE: NO GROWTH

## 2015-04-21 ENCOUNTER — Emergency Department: Payer: BLUE CROSS/BLUE SHIELD

## 2015-04-21 ENCOUNTER — Encounter: Payer: Self-pay | Admitting: Emergency Medicine

## 2015-04-21 ENCOUNTER — Emergency Department
Admission: EM | Admit: 2015-04-21 | Discharge: 2015-04-21 | Disposition: A | Discharge status: Home / Self Care | Payer: BLUE CROSS/BLUE SHIELD | Attending: Emergency Medicine | Admitting: Emergency Medicine

## 2015-04-21 DIAGNOSIS — N200 Calculus of kidney: Secondary | ICD-10-CM | POA: Diagnosis not present

## 2015-04-21 DIAGNOSIS — Z79899 Other long term (current) drug therapy: Secondary | ICD-10-CM | POA: Diagnosis not present

## 2015-04-21 DIAGNOSIS — R1030 Lower abdominal pain, unspecified: Secondary | ICD-10-CM | POA: Diagnosis present

## 2015-04-21 LAB — URINALYSIS COMPLETE WITH MICROSCOPIC (ARMC ONLY)
BACTERIA UA: NONE SEEN
Bilirubin Urine: NEGATIVE
Glucose, UA: NEGATIVE mg/dL
KETONES UR: NEGATIVE mg/dL
Leukocytes, UA: NEGATIVE
NITRITE: NEGATIVE
PH: 6 (ref 5.0–8.0)
Protein, ur: 30 mg/dL — AB
SPECIFIC GRAVITY, URINE: 1.02 (ref 1.005–1.030)
Squamous Epithelial / LPF: NONE SEEN

## 2015-04-21 LAB — BASIC METABOLIC PANEL
Anion gap: 9 (ref 5–15)
BUN: 22 mg/dL — ABNORMAL HIGH (ref 6–20)
CALCIUM: 9.1 mg/dL (ref 8.9–10.3)
CHLORIDE: 101 mmol/L (ref 101–111)
CO2: 28 mmol/L (ref 22–32)
Creatinine, Ser: 1.54 mg/dL — ABNORMAL HIGH (ref 0.61–1.24)
GFR calc Af Amer: >60 mL/min (ref >60)
GFR, EST NON AFRICAN AMERICAN: 55 mL/min — AB (ref >60)
GLUCOSE: 106 mg/dL — AB (ref 65–99)
Potassium: 3.5 mmol/L (ref 3.5–5.1)
SODIUM: 138 mmol/L (ref 135–145)

## 2015-04-21 LAB — CBC
HEMATOCRIT: 37.7 % — AB (ref 40.0–52.0)
HEMOGLOBIN: 13.4 g/dL (ref 13.0–18.0)
MCH: 31.8 pg (ref 26.0–34.0)
MCHC: 35.5 g/dL (ref 32.0–36.0)
MCV: 89.6 fL (ref 80.0–100.0)
Platelets: 172 10*3/uL (ref 150–440)
RBC: 4.21 MIL/uL — AB (ref 4.40–5.90)
RDW: 12.6 % (ref 11.5–14.5)
WBC: 10.5 10*3/uL (ref 3.8–10.6)

## 2015-04-21 MED ORDER — KETOROLAC TROMETHAMINE 30 MG/ML IJ SOLN
30.0000 mg | Freq: Once | INTRAMUSCULAR | Status: AC
  Administered 2015-04-21: 30 mg via INTRAVENOUS

## 2015-04-21 MED ORDER — ONDANSETRON HCL 4 MG/2ML IJ SOLN
4.0000 mg | Freq: Once | INTRAMUSCULAR | Status: AC
  Administered 2015-04-21: 4 mg via INTRAVENOUS

## 2015-04-21 MED ORDER — KETOROLAC TROMETHAMINE 60 MG/2ML IM SOLN
INTRAMUSCULAR | Status: AC
  Filled 2015-04-21: qty 2

## 2015-04-21 MED ORDER — ONDANSETRON HCL 4 MG/2ML IJ SOLN
INTRAMUSCULAR | Status: AC
  Filled 2015-04-21: qty 2

## 2015-04-21 NOTE — ED Notes (Signed)
C/o lower abdominal pain.  Seen for same complaint in [place] on Monday.  No X-rays done and oxycodone given.   Initially pain in right low back and flank, now pain is all around abdomen.  Has seen blood in urine today.

## 2015-04-21 NOTE — ED Notes (Signed)
Here with lower abd pain and bilateral flank pain. hx of kidney stones, passing blood in urine.

## 2015-04-21 NOTE — ED Provider Notes (Signed)
[facility] Emergency Department Provider Note   ____________________________________________  Time seen: ~2005  I have reviewed the triage vital signs and the nursing notes.   HISTORY  Chief Complaint Flank Pain and Abdominal Pain   History limited by: Not Limited   HPI Dale Davis is a 40 y.o. male who presented to the emergency department today because of concerns for possible kidney stone. Patient states he's been having right-sided flank pain for roughly the past 5 days. It is sharp. It is become severe. Patient was seen at urgent care on Monday and discharged home with pain medication. Patient states that since that time he feels like the pain is spread across his lower abdomen. He does have a history of kidney stones but states this was more severe. By the time of my examination however patient thinks he might of passed stone. He states he did urinate and noticed a small see and brain-like object in his urine. He states that shortly after this urination he had great relief of the pain. He does state he has had subjective mild fevers.    Past Medical History  Diagnosis Date  . Kidney stones   . Kidney stones     There are no active problems to display for this patient.   History reviewed. No pertinent past surgical history.  Current Outpatient Rx  Name  Route  Sig  Dispense  Refill  . clonazePAM (KLONOPIN) 0.5 MG tablet   Oral   Take 0.5 mg by mouth 2 (two) times daily.      3   . naproxen (NAPROSYN) 250 MG tablet   Oral   Take 1 tablet (250 mg total) by mouth 2 (two) times daily as needed for mild pain or moderate pain (take with food).   14 tablet   0   . NUVIGIL 250 MG tablet   Oral   Take 250 mg by mouth every morning.      1     Dispense as written.   Marland Kitchen OLANZapine-FLUoxetine (SYMBYAX) 3-25 MG per capsule   Oral   Take 1 capsule by mouth at bedtime.       3   . ondansetron (ZOFRAN) 4 MG tablet   Oral   Take 1  tablet (4 mg total) by mouth every 8 (eight) hours as needed for nausea or vomiting.   6 tablet   0   . oxyCODONE-acetaminophen (PERCOCET/ROXICET) 5-325 MG tablet   Oral   Take 1 tablet by mouth every 4 (four) hours as needed for severe pain.   8 tablet   0     Allergies Review of patient's allergies indicates no known allergies.  No family history on file.  Social History Social History  Substance Use Topics  . Smoking status: Never Smoker   . Smokeless tobacco: Current User    Types: Snuff  . Alcohol Use: 2.4 oz/week    4 Cans of beer per week     Comment: occ    Review of Systems  Constitutional: Negative for fever. Cardiovascular: Negative for chest pain. Respiratory: Negative for shortness of breath. Gastrointestinal: Positive for right sided flank pain Neurological: Negative for headaches, focal weakness or numbness.  10-point ROS otherwise negative.  ____________________________________________   PHYSICAL EXAM:  VITAL SIGNS: ED Triage Vitals  Enc Vitals Group     BP 04/21/15 1827 135/82 mmHg     Pulse Rate 04/21/15 1827 79     Resp 04/21/15 1827 18  Temp 04/21/15 1827 98.3 F (36.8 C)     Temp Source 04/21/15 1827 Oral     SpO2 04/21/15 1827 100 %     Weight 04/21/15 1827 218 lb (98.884 kg)     Height 04/21/15 1827 5\' 11"  (1.803 m)     Head Cir --      Peak Flow --      Pain Score 04/21/15 1829 10   Constitutional: Alert and oriented. Well appearing and in no distress. Eyes: Conjunctivae are normal. PERRL. Normal extraocular movements. ENT   Head: Normocephalic and atraumatic.   Nose: No congestion/rhinnorhea.   Mouth/Throat: Mucous membranes are moist.   Neck: No stridor. Hematological/Lymphatic/Immunilogical: No cervical lymphadenopathy. Cardiovascular: Normal rate, regular rhythm.  No murmurs, rubs, or gallops. Respiratory: Normal respiratory effort without tachypnea nor retractions. Breath sounds are clear and equal  bilaterally. No wheezes/rales/rhonchi. Gastrointestinal: Soft and nontender. No distention. There is no CVA tenderness. Genitourinary: Deferred Musculoskeletal: Normal range of motion in all extremities. No joint effusions.  No lower extremity tenderness nor edema. Neurologic:  Normal speech and language. No gross focal neurologic deficits are appreciated.  Skin:  Skin is warm, dry and intact. No rash noted. Psychiatric: Mood and affect are normal. Speech and behavior are normal. Patient exhibits appropriate insight and judgment.  ____________________________________________    LABS (pertinent positives/negatives)  Labs Reviewed  BASIC METABOLIC PANEL - Abnormal; Notable for the following:    Glucose, Bld 106 (*)    BUN 22 (*)    Creatinine, Ser 1.54 (*)    GFR calc non Af Amer 55 (*)    All other components within normal limits  CBC - Abnormal; Notable for the following:    RBC 4.21 (*)    HCT 37.7 (*)    All other components within normal limits  URINALYSIS COMPLETEWITH MICROSCOPIC ([facility] ONLY) - Abnormal; Notable for the following:    Color, Urine YELLOW (*)    APPearance HAZY (*)    Hgb urine dipstick 3+ (*)    Protein, ur 30 (*)    All other components within normal limits     ____________________________________________   EKG  None  ____________________________________________    RADIOLOGY  US renal IMPRESSION: 1. Mild right-sided pelvicaliectasis remains within normal limits. The patient's pain stopped when he gave a urine sample shortly before the study; he believes he passed the stone at that time. 2. Previously noted tiny bilateral renal stones are not well characterized on this study.   ____________________________________________   PROCEDURES  Procedure(s) performed: None  Critical Care performed: No  ____________________________________________   INITIAL IMPRESSION / ASSESSMENT AND PLAN / ED COURSE  Pertinent labs & imaging results that  were available during my care of the patient were reviewed by me and considered in my medical decision making (see chart for details).  Patient presented to the emergency department today because of concerns for right flank pain. Patient does think that he might have passed a stone when he was urinating here. Ultrasound did not show any signs of any obstruction. This point patient appears well and is pain-free. Will plan on discharging home with urology follow-up.  ____________________________________________   FINAL CLINICAL IMPRESSION(S) / ED DIAGNOSES  Final diagnoses:  Kidney stones     Nance Pear, MD 04/21/15 2128

## 2015-04-21 NOTE — Discharge Instructions (Signed)
Please seek medical attention for any high fevers, chest pain, shortness of breath, change in behavior, persistent vomiting, bloody stool or any other new or concerning symptoms. ° ° °Kidney Stones °Kidney stones (urolithiasis) are deposits that form inside your kidneys. The intense pain is caused by the stone moving through the urinary tract. When the stone moves, the ureter goes into spasm around the stone. The stone is usually passed in the urine.  °CAUSES  °· A disorder that makes certain neck glands produce too much parathyroid hormone (primary hyperparathyroidism). °· A buildup of uric acid crystals, similar to gout in your joints. °· Narrowing (stricture) of the ureter. °· A kidney obstruction present at birth (congenital obstruction). °· Previous surgery on the kidney or ureters. °· Numerous kidney infections. °SYMPTOMS  °· Feeling sick to your stomach (nauseous). °· Throwing up (vomiting). °· Blood in the urine (hematuria). °· Pain that usually spreads (radiates) to the groin. °· Frequency or urgency of urination. °DIAGNOSIS  °· Taking a history and physical exam. °· Blood or urine tests. °· CT scan. °· Occasionally, an examination of the inside of the urinary bladder (cystoscopy) is performed. °TREATMENT  °· Observation. °· Increasing your fluid intake. °· Extracorporeal shock wave lithotripsy--This is a noninvasive procedure that uses shock waves to break up kidney stones. °· Surgery may be needed if you have severe pain or persistent obstruction. There are various surgical procedures. Most of the procedures are performed with the use of small instruments. Only small incisions are needed to accommodate these instruments, so recovery time is minimized. °The size, location, and chemical composition are all important variables that will determine the proper choice of action for you. Talk to your health care provider to better understand your situation so that you will minimize the risk of injury to yourself  and your kidney.  °HOME CARE INSTRUCTIONS  °· Drink enough water and fluids to keep your urine clear or pale yellow. This will help you to pass the stone or stone fragments. °· Strain all urine through the provided strainer. Keep all particulate matter and stones for your health care provider to see. The stone causing the pain may be as small as a grain of salt. It is very important to use the strainer each and every time you pass your urine. The collection of your stone will allow your health care provider to analyze it and verify that a stone has actually passed. The stone analysis will often identify what you can do to reduce the incidence of recurrences. °· Only take over-the-counter or prescription medicines for pain, discomfort, or fever as directed by your health care provider. °· Keep all follow-up visits as told by your health care provider. This is important. °· Get follow-up X-rays if required. The absence of pain does not always mean that the stone has passed. It may have only stopped moving. If the urine remains completely obstructed, it can cause loss of kidney function or even complete destruction of the kidney. It is your responsibility to make sure X-rays and follow-ups are completed. Ultrasounds of the kidney can show blockages and the status of the kidney. Ultrasounds are not associated with any radiation and can be performed easily in a matter of minutes. °· Make changes to your daily diet as told by your health care provider. You may be told to: °¨ Limit the amount of salt that you eat. °¨ Eat 5 or more servings of fruits and vegetables each day. °¨ Limit the amount of meat,   poultry, fish, and eggs that you eat. °· Collect a 24-hour urine sample as told by your health care provider. You may need to collect another urine sample every 6-12 months. °SEEK MEDICAL CARE IF: °· You experience pain that is progressive and unresponsive to any pain medicine you have been prescribed. °SEEK IMMEDIATE  MEDICAL CARE IF:  °· Pain cannot be controlled with the prescribed medicine. °· You have a fever or shaking chills. °· The severity or intensity of pain increases over 18 hours and is not relieved by pain medicine. °· You develop a new onset of abdominal pain. °· You feel faint or pass out. °· You are unable to urinate. °  °This information is not intended to replace advice given to you by your health care provider. Make sure you discuss any questions you have with your health care provider. °  °Document Released: 02/11/2005 Document Revised: 11/02/2014 Document Reviewed: 07/15/2012 °Elsevier Interactive Patient Education ©2016 [employer] ° °

## 2015-04-24 ENCOUNTER — Emergency Department: Payer: BLUE CROSS/BLUE SHIELD

## 2015-04-24 ENCOUNTER — Emergency Department
Admission: EM | Admit: 2015-04-24 | Discharge: 2015-04-24 | Disposition: A | Discharge status: Home / Self Care | Payer: BLUE CROSS/BLUE SHIELD | Attending: Emergency Medicine | Admitting: Emergency Medicine

## 2015-04-24 ENCOUNTER — Encounter: Payer: Self-pay | Admitting: *Deleted

## 2015-04-24 DIAGNOSIS — Z79899 Other long term (current) drug therapy: Secondary | ICD-10-CM | POA: Diagnosis not present

## 2015-04-24 DIAGNOSIS — R109 Unspecified abdominal pain: Secondary | ICD-10-CM

## 2015-04-24 DIAGNOSIS — N201 Calculus of ureter: Secondary | ICD-10-CM | POA: Diagnosis not present

## 2015-04-24 LAB — URINALYSIS COMPLETE WITH MICROSCOPIC (ARMC ONLY)
Bacteria, UA: NONE SEEN
Bilirubin Urine: NEGATIVE
Glucose, UA: NEGATIVE mg/dL
KETONES UR: NEGATIVE mg/dL
Leukocytes, UA: NEGATIVE
NITRITE: NEGATIVE
PH: 6 (ref 5.0–8.0)
Protein, ur: NEGATIVE mg/dL
SPECIFIC GRAVITY, URINE: 1.028 (ref 1.005–1.030)
Squamous Epithelial / LPF: NONE SEEN

## 2015-04-24 LAB — COMPREHENSIVE METABOLIC PANEL
ALK PHOS: 81 U/L (ref 38–126)
ALT: 35 U/L (ref 17–63)
ANION GAP: 7 (ref 5–15)
AST: 25 U/L (ref 15–41)
Albumin: 3.8 g/dL (ref 3.5–5.0)
BILIRUBIN TOTAL: 0.4 mg/dL (ref 0.3–1.2)
BUN: 20 mg/dL (ref 6–20)
CO2: 26 mmol/L (ref 22–32)
Calcium: 8.8 mg/dL — ABNORMAL LOW (ref 8.9–10.3)
Chloride: 104 mmol/L (ref 101–111)
Creatinine, Ser: 1.46 mg/dL — ABNORMAL HIGH (ref 0.61–1.24)
GFR calc Af Amer: >60 mL/min (ref >60)
GFR, EST NON AFRICAN AMERICAN: 59 mL/min — AB (ref >60)
Glucose, Bld: 97 mg/dL (ref 65–99)
POTASSIUM: 3.9 mmol/L (ref 3.5–5.1)
Sodium: 137 mmol/L (ref 135–145)
Total Protein: 7.6 g/dL (ref 6.5–8.1)

## 2015-04-24 LAB — CBC
HEMATOCRIT: 37 % — AB (ref 40.0–52.0)
Hemoglobin: 13.1 g/dL (ref 13.0–18.0)
MCH: 31.4 pg (ref 26.0–34.0)
MCHC: 35.4 g/dL (ref 32.0–36.0)
MCV: 88.7 fL (ref 80.0–100.0)
Platelets: 203 10*3/uL (ref 150–440)
RBC: 4.17 MIL/uL — ABNORMAL LOW (ref 4.40–5.90)
RDW: 12.9 % (ref 11.5–14.5)
WBC: 10.6 10*3/uL (ref 3.8–10.6)

## 2015-04-24 MED ORDER — TAMSULOSIN HCL 0.4 MG PO CAPS: 0.4000 mg | ORAL_CAPSULE | Freq: Every day | ORAL | Status: DC

## 2015-04-24 MED ORDER — SODIUM CHLORIDE 0.9 % IV SOLN
1000.0000 mL | Freq: Once | INTRAVENOUS | Status: AC
  Administered 2015-04-24: 1000 mL via INTRAVENOUS

## 2015-04-24 MED ORDER — KETOROLAC TROMETHAMINE 30 MG/ML IJ SOLN
INTRAMUSCULAR | Status: AC
  Administered 2015-04-24: 30 mg via INTRAVENOUS
  Filled 2015-04-24: qty 1

## 2015-04-24 MED ORDER — OXYCODONE-ACETAMINOPHEN 5-325 MG PO TABS: 1.0000 | ORAL_TABLET | Freq: Four times a day (QID) | ORAL | Status: DC | PRN

## 2015-04-24 MED ORDER — NAPROXEN 500 MG PO TABS: 500.0000 mg | ORAL_TABLET | Freq: Two times a day (BID) | ORAL | Status: DC

## 2015-04-24 MED ORDER — ONDANSETRON HCL 4 MG PO TABS: 4.0000 mg | ORAL_TABLET | Freq: Every day | ORAL | Status: DC | PRN

## 2015-04-24 MED ORDER — KETOROLAC TROMETHAMINE 30 MG/ML IJ SOLN
30.0000 mg | Freq: Once | INTRAMUSCULAR | Status: AC
  Administered 2015-04-24: 30 mg via INTRAVENOUS

## 2015-04-24 NOTE — ED Notes (Signed)
Patient returned from CT

## 2015-04-24 NOTE — Discharge Instructions (Signed)
Kidney Stones °Kidney stones (urolithiasis) are deposits that form inside your kidneys. The intense pain is caused by the stone moving through the urinary tract. When the stone moves, the ureter goes into spasm around the stone. The stone is usually passed in the urine.  °CAUSES  °· A disorder that makes certain neck glands produce too much parathyroid hormone (primary hyperparathyroidism). °· A buildup of uric acid crystals, similar to gout in your joints. °· Narrowing (stricture) of the ureter. °· A kidney obstruction present at birth (congenital obstruction). °· Previous surgery on the kidney or ureters. °· Numerous kidney infections. °SYMPTOMS  °· Feeling sick to your stomach (nauseous). °· Throwing up (vomiting). °· Blood in the urine (hematuria). °· Pain that usually spreads (radiates) to the groin. °· Frequency or urgency of urination. °DIAGNOSIS  °· Taking a history and physical exam. °· Blood or urine tests. °· CT scan. °· Occasionally, an examination of the inside of the urinary bladder (cystoscopy) is performed. °TREATMENT  °· Observation. °· Increasing your fluid intake. °· Extracorporeal shock wave lithotripsy--This is a noninvasive procedure that uses shock waves to break up kidney stones. °· Surgery may be needed if you have severe pain or persistent obstruction. There are various surgical procedures. Most of the procedures are performed with the use of small instruments. Only small incisions are needed to accommodate these instruments, so recovery time is minimized. °The size, location, and chemical composition are all important variables that will determine the proper choice of action for you. Talk to your health care provider to better understand your situation so that you will minimize the risk of injury to yourself and your kidney.  °HOME CARE INSTRUCTIONS  °· Drink enough water and fluids to keep your urine clear or pale yellow. This will help you to pass the stone or stone fragments. °· Strain  all urine through the provided strainer. Keep all particulate matter and stones for your health care provider to see. The stone causing the pain may be as small as a grain of salt. It is very important to use the strainer each and every time you pass your urine. The collection of your stone will allow your health care provider to analyze it and verify that a stone has actually passed. The stone analysis will often identify what you can do to reduce the incidence of recurrences. °· Only take over-the-counter or prescription medicines for pain, discomfort, or fever as directed by your health care provider. °· Keep all follow-up visits as told by your health care provider. This is important. °· Get follow-up X-rays if required. The absence of pain does not always mean that the stone has passed. It may have only stopped moving. If the urine remains completely obstructed, it can cause loss of kidney function or even complete destruction of the kidney. It is your responsibility to make sure X-rays and follow-ups are completed. Ultrasounds of the kidney can show blockages and the status of the kidney. Ultrasounds are not associated with any radiation and can be performed easily in a matter of minutes. °· Make changes to your daily diet as told by your health care provider. You may be told to: °¨ Limit the amount of salt that you eat. °¨ Eat 5 or more servings of fruits and vegetables each day. °¨ Limit the amount of meat, poultry, fish, and eggs that you eat. °· Collect a 24-hour urine sample as told by your health care provider. You may need to collect another urine sample every 6-12   months. °SEEK MEDICAL CARE IF: °· You experience pain that is progressive and unresponsive to any pain medicine you have been prescribed. °SEEK IMMEDIATE MEDICAL CARE IF:  °· Pain cannot be controlled with the prescribed medicine. °· You have a fever or shaking chills. °· The severity or intensity of pain increases over 18 hours and is not  relieved by pain medicine. °· You develop a new onset of abdominal pain. °· You feel faint or pass out. °· You are unable to urinate. °  °This information is not intended to replace advice given to you by your health care provider. Make sure you discuss any questions you have with your health care provider. °  °Document Released: 02/11/2005 Document Revised: 11/02/2014 Document Reviewed: 07/15/2012 °Elsevier Interactive Patient Education ©2016 [employer] ° °

## 2015-04-24 NOTE — ED Notes (Signed)
States was seen in ER Friday and thought he passed a kidney stone, states since Sunday pain has returned on same right side, flank pain to his groin, states blood tinged urine, pt moaning and uncomfortable in triage

## 2015-04-24 NOTE — ED Provider Notes (Signed)
[facility] Emergency Department Provider Note  ____________________________________________    I have reviewed the triage vital signs and the nursing notes.   HISTORY  Chief Complaint Flank Pain    HPI ILLIAM Davis is a 40 y.o. male who presents with complaints of right flank pain since last night. Patient reports this feels like kidney stone he has had many in the past. He reports he thought he passed one on Friday when he was actually in our ER but he reports the pain returned 2 days later. He denies fevers or chills. No dysuria. Complains of severe sharp right flank pain which is not improved by anything     Past Medical History  Diagnosis Date  . Kidney stones   . Kidney stones     There are no active problems to display for this patient.   History reviewed. No pertinent past surgical history.  Current Outpatient Rx  Name  Route  Sig  Dispense  Refill  . clonazePAM (KLONOPIN) 0.5 MG tablet   Oral   Take 0.5 mg by mouth 2 (two) times daily.      3   . naproxen (NAPROSYN) 250 MG tablet   Oral   Take 1 tablet (250 mg total) by mouth 2 (two) times daily as needed for mild pain or moderate pain (take with food).   14 tablet   0   . NUVIGIL 250 MG tablet   Oral   Take 250 mg by mouth every morning.      1     Dispense as written.   Marland Kitchen OLANZapine-FLUoxetine (SYMBYAX) 3-25 MG per capsule   Oral   Take 1 capsule by mouth at bedtime.       3   . ondansetron (ZOFRAN) 4 MG tablet   Oral   Take 1 tablet (4 mg total) by mouth every 8 (eight) hours as needed for nausea or vomiting.   6 tablet   0   . oxyCODONE-acetaminophen (PERCOCET/ROXICET) 5-325 MG tablet   Oral   Take 1 tablet by mouth every 4 (four) hours as needed for severe pain.   8 tablet   0     Allergies Review of patient's allergies indicates no known allergies.  History reviewed. No pertinent family history.  Social History Social History  Substance Use  Topics  . Smoking status: Never Smoker   . Smokeless tobacco: Current User    Types: Snuff  . Alcohol Use: 2.4 oz/week    4 Cans of beer per week     Comment: occ    Review of Systems  Constitutional: Negative for fever. Eyes: Negative for blurry vision ENT: Negative for sore throat Cardiovascular: Negative for chest pain. Respiratory: Negative for cough Gastrointestinal: Flank pain as above Genitourinary: Negative for dysuria. Musculoskeletal: Negative for back pain. Skin: Negative for rash. Neurological: Negative for headaches Psychiatric: No anxiety    ____________________________________________   PHYSICAL EXAM:  VITAL SIGNS: ED Triage Vitals  Enc Vitals Group     BP 04/24/15 1458 164/99 mmHg     Pulse Rate 04/24/15 1458 71     Resp 04/24/15 1458 24     Temp 04/24/15 1458 99.4 F (37.4 C)     Temp Source 04/24/15 1458 Oral     SpO2 04/24/15 1458 99 %     Weight 04/24/15 1458 218 lb (98.884 kg)     Height 04/24/15 1458 5\' 11"  (1.803 m)     Head Cir --  Peak Flow --      Pain Score 04/24/15 1459 8     Pain Loc --      Pain Edu? --      Excl. in [place]? --      Constitutional: Alert and oriented. Well appearing and in no distress. Eyes: Conjunctivae are normal.  ENT   Head: Normocephalic and atraumatic.   Mouth/Throat: Mucous membranes are moist. Cardiovascular: Normal rate, regular rhythm.   Respiratory: Normal respiratory effort without tachypnea nor retractions. Breath sounds are clear and equal bilaterally.  Gastrointestinal: Soft and non-tender in all quadrants. No distention. There is no CVA tenderness. Genitourinary: deferred Musculoskeletal: Nontender with normal range of motion in all extremities. No lower extremity tenderness nor edema. Neurologic:  Normal speech and language. No gross focal neurologic deficits are appreciated. Skin:  Skin is warm, dry and intact. No rash noted. Psychiatric: Mood and affect are normal. Patient exhibits  appropriate insight and judgment.  ____________________________________________    LABS (pertinent positives/negatives)  Labs Reviewed  CBC - Abnormal; Notable for the following:    RBC 4.17 (*)    HCT 37.0 (*)    All other components within normal limits  COMPREHENSIVE METABOLIC PANEL - Abnormal; Notable for the following:    Creatinine, Ser 1.46 (*)    Calcium 8.8 (*)    GFR calc non Af Amer 59 (*)    All other components within normal limits  URINALYSIS COMPLETEWITH MICROSCOPIC ([facility] ONLY)    ____________________________________________   EKG  None  ____________________________________________    RADIOLOGY I have personally reviewed any xrays that were ordered on this patient: CT shows 5.5 mid ureter stone  ____________________________________________   PROCEDURES  Procedure(s) performed: none  Critical Care performed: none  ____________________________________________   INITIAL IMPRESSION / ASSESSMENT AND PLAN / ED COURSE  Pertinent labs & imaging results that were available during my care of the patient were reviewed by me and considered in my medical decision making (see chart for details).  Patient with acute onset right flank pain and reported hematuria. IV Toradol given with significant improvement in his pain. We'll obtain CT renal stone study  ----------------------------------------- 4:29 PM on 04/24/2015 -----------------------------------------  CT demonstrates a 5 x 5 mm mid ureter kidney stone. Patient is completely pain-free. I arranged an outpatient appointment with urology for him tomorrow at 9:15 AM at [facility]. We did discuss return precautions. Patient's creatinine is within his usual range area no evidence of infection. Afebrile, no tachycardia, no white blood cell count. Urinalysis unremarkable  ____________________________________________   FINAL CLINICAL IMPRESSION(S) / ED DIAGNOSES  Final diagnoses:   Flank pain  Ureterolithiasis     Lavonia Drafts, MD 04/24/15 [phone]

## 2015-04-25 ENCOUNTER — Encounter: Payer: Self-pay | Admitting: Obstetrics and Gynecology

## 2015-04-25 ENCOUNTER — Ambulatory Visit (INDEPENDENT_AMBULATORY_CARE_PROVIDER_SITE_OTHER): Payer: BLUE CROSS/BLUE SHIELD | Admitting: Obstetrics and Gynecology

## 2015-04-25 VITALS — BP 143/84 | HR 77 | Temp 97.9°F | Resp 16 | Ht 71.0 in | Wt 225.1 lb

## 2015-04-25 DIAGNOSIS — N200 Calculus of kidney: Secondary | ICD-10-CM

## 2015-04-25 DIAGNOSIS — R3129 Other microscopic hematuria: Secondary | ICD-10-CM | POA: Diagnosis not present

## 2015-04-25 LAB — MICROSCOPIC EXAMINATION
Bacteria, UA: NONE SEEN
Epithelial Cells (non renal): NONE SEEN /hpf (ref 0–10)

## 2015-04-25 LAB — URINALYSIS, COMPLETE
Bilirubin, UA: NEGATIVE
GLUCOSE, UA: NEGATIVE
Ketones, UA: NEGATIVE
Leukocytes, UA: NEGATIVE
NITRITE UA: NEGATIVE
PH UA: 6 (ref 5.0–7.5)
SPEC GRAV UA: 1.025 (ref 1.005–1.030)
Urobilinogen, Ur: 1 mg/dL (ref 0.2–1.0)

## 2015-04-25 NOTE — Progress Notes (Signed)
04/25/2015 9:02 AM   Dale Davis November 03, 1975 [id number]  Referring provider: No referring provider defined for this encounter.  Chief Complaint  Patient presents with  . Nephrolithiasis  . Establish Care    HPI: Patient is a 40 year old male with a history of kidney stones presenting today for follow-up after being seen in the emergency department on 04/24/15 for acute episode of right leg pain occurring over the last week. CT scan was performed noting a 5.5 x 5.0 mm mid distal ureteral calculus on the right side with moderate to high-grade obstruction with hydroureteronephrosis. No renal or bladder calculi visualized.  2-3 previous stone episodes all passed with medical management.  Pain resolved yesterday. Has been straining urine but has not seen stone pass.   Previous varicocelectomy a few years ago.   No fevers, nausea or vomiting.   Cr 1.46 slightly elevated from baseline of 1.30 WBC WNL  PMH: Past Medical History  Diagnosis Date  . Kidney stones   . Kidney stones     Surgical History: History reviewed. No pertinent past surgical history.  Home Medications:    Medication List       This list is accurate as of: 04/25/15 11:59 PM.  Always use your most recent med list.               clonazePAM 0.5 MG tablet  Commonly known as:  KLONOPIN  Take 0.5 mg by mouth 2 (two) times daily.     naproxen 500 MG tablet  Commonly known as:  NAPROSYN  Take 1 tablet (500 mg total) by mouth 2 (two) times daily with a meal.     NUVIGIL 250 MG tablet  Generic drug:  Armodafinil  Take 250 mg by mouth every morning.     OLANZapine-FLUoxetine 3-25 MG capsule  Commonly known as:  SYMBYAX  Take 1 capsule by mouth at bedtime.     ondansetron 4 MG tablet  Commonly known as:  ZOFRAN  Take 1 tablet (4 mg total) by mouth daily as needed for nausea or vomiting.     oxyCODONE-acetaminophen 5-325 MG tablet  Commonly known as:  ROXICET  Take 1 tablet by mouth every 6 (six)  hours as needed.     tamsulosin 0.4 MG Caps capsule  Commonly known as:  FLOMAX  Take 1 capsule (0.4 mg total) by mouth daily.        Allergies: No Known Allergies  Family History: Family History  Problem Relation Age of Onset  . Kidney failure Father     Social History:  reports that he has never smoked. His smokeless tobacco use includes Snuff. He reports that he drinks about 2.4 oz of alcohol per week. He reports that he does not use illicit drugs.  ROS: UROLOGY Frequent Urination?: No Hard to postpone urination?: No Burning/pain with urination?: No Get up at night to urinate?: No Leakage of urine?: No Urine stream starts and stops?: No Trouble starting stream?: No Do you have to strain to urinate?: No Blood in urine?: Yes Urinary tract infection?: No Sexually transmitted disease?: No Injury to kidneys or bladder?: No Painful intercourse?: No Weak stream?: Yes Erection problems?: No Penile pain?: No  Gastrointestinal Nausea?: No Vomiting?: No Indigestion/heartburn?: No Diarrhea?: No Constipation?: No  Constitutional Fever: Yes Night sweats?: No Weight loss?: No Fatigue?: Yes  Skin Skin rash/lesions?: No Itching?: No  Eyes Blurred vision?: No Double vision?: No  Ears/Nose/Throat Sore throat?: No Sinus problems?: No  Hematologic/Lymphatic Swollen glands?: No Easy  bruising?: No  Cardiovascular Leg swelling?: No Chest pain?: No  Respiratory Cough?: No Shortness of Davis?: No  Endocrine Excessive thirst?: No  Musculoskeletal Back pain?: Yes Joint pain?: No  Neurological Headaches?: Yes Dizziness?: Yes  Psychologic Depression?: No Anxiety?: No  Physical Exam: BP 143/84 mmHg  Pulse 77  Temp(Src) 97.9 F (36.6 C)  Resp 16  Ht 5\' 11"  (1.803 m)  Wt 225 lb 1.6 oz (102.105 kg)  BMI 31.41 kg/m2  Constitutional:  Alert and oriented, No acute distress. HEENT: [place] AT, moist mucus membranes.  Trachea midline, no  masses. Cardiovascular: No clubbing, cyanosis, or edema. Respiratory: Normal respiratory effort, no increased work of breathing. GI: Abdomen is soft, nontender, nondistended, no abdominal masses GU: No CVA tenderness.  Skin: No rashes, bruises or suspicious lesions. Lymph: No cervical or inguinal adenopathy. Neurologic: Grossly intact, no focal deficits, moving all 4 extremities. Psychiatric: Normal mood and affect.  Laboratory Data:  Lab Results  Component Value Date   WBC 10.6 04/24/2015   HGB 13.1 04/24/2015   HCT 37.0* 04/24/2015   MCV 88.7 04/24/2015   PLT 203 04/24/2015    Lab Results  Component Value Date   CREATININE 1.46* 04/24/2015    No results found for: PSA  No results found for: TESTOSTERONE  No results found for: HGBA1C  Urinalysis    Component Value Date/Time   COLORURINE YELLOW* 04/24/2015 1607   APPEARANCEUR CLEAR* 04/24/2015 1607   LABSPEC 1.028 04/24/2015 1607   PHURINE 6.0 04/24/2015 1607   GLUCOSEU Negative 04/25/2015 0919   HGBUR 2+* 04/24/2015 1607   BILIRUBINUR Negative 04/25/2015 0919   BILIRUBINUR NEGATIVE 04/24/2015 1607   KETONESUR NEGATIVE 04/24/2015 1607   PROTEINUR NEGATIVE 04/24/2015 1607   UROBILINOGEN 0.2 08/18/2014 0725   NITRITE Negative 04/25/2015 0919   NITRITE NEGATIVE 04/24/2015 1607   LEUKOCYTESUR Negative 04/25/2015 0919   LEUKOCYTESUR NEGATIVE 04/24/2015 1607    Pertinent Imaging: CLINICAL DATA: Right flank pain since Saturday. Hematuria.  EXAM: CT ABDOMEN AND PELVIS WITHOUT CONTRAST  TECHNIQUE: Multidetector CT imaging of the abdomen and pelvis was performed following the standard protocol without IV contrast.  COMPARISON: CT scan 08/01/2014.  FINDINGS: Lower chest: The lung bases are clear except for dependent subpleural atelectasis. No pleural effusion or pulmonary lesions. The heart is normal in size. No pericardial effusion. The distal esophagus is grossly normal.  Hepatobiliary: No focal  hepatic lesions or intrahepatic biliary dilatation. The gallbladder is normal. No common bile duct dilatation.  Pancreas: No mass, inflammation or ductal dilatation.  Spleen: Normal size. No focal lesions.  Adrenals/Urinary Tract: The adrenal glands are normal in stable.  No renal calculi are identified. There is moderate right-sided hydronephrosis and perinephric interstitial changes. Moderate right hydroureter down to an obstructing 5.5 x 5.0 mm calculus located just below the pelvic inlet. No left-sided ureteral calculi.  Stomach/Bowel: The stomach, duodenum, small bowel and colon are unremarkable. No inflammatory changes, mass lesions or obstructive findings. Scattered sigmoid diverticulosis without findings for acute diverticulitis. The terminal ileum is normal. The appendix is normal.  Vascular/Lymphatic: No mesenteric or retroperitoneal mass or adenopathy. The aorta and branch vessels not demonstrate any atherosclerotic calcifications.  Other: The bladder, prostate gland and seminal vesicles are unremarkable. No pelvic mass or adenopathy. No free pelvic fluid collections. No inguinal mass or adenopathy.  Musculoskeletal: No significant bony findings.  IMPRESSION: 1. 5.5 x 5.0 mm mid distal ureteral calculus on the right side with moderate to high-grade obstruction with hydroureteronephrosis. 2. No renal, left ureteral  or bladder calculi. 3. No other significant abdominal/pelvic findings. Electronically Signed  By: Marijo Sanes M.D.  On: 04/24/2015 16:05  Assessment & Plan:    1. Nephrolithiasis- 5.5 x 5.0 mm mid distal ureteral calculus on the right side with moderate to high-grade obstruction with hydroureteronephrosis. - RUS/KUB - Return precautions reviewed. -provided pocket urine strainer  2. Microscopic Hematuria- Recheck UA at follow-up visit to confirm resolution once ureteral stone has passed.  There are no diagnoses linked to this  encounter.  Return in about 10 days (around 05/05/2015) for RUS/KUB prior.  These notes generated with voice recognition software. I apologize for typographical errors.  Herbert Moors, [facility] [address] [place], [place] [address] [phone]

## 2015-04-29 LAB — CULTURE, URINE COMPREHENSIVE

## 2015-05-02 ENCOUNTER — Telehealth: Payer: Self-pay

## 2015-05-02 NOTE — Telephone Encounter (Signed)
-----   Message from Roda Shutters, [place] sent at 05/02/2015 10:28 AM EST ----- Please notify patient that his urine culture was suspicious for infection. If he is currently experiencing any further urinary symptoms I would like him to start Bactrim DS twice daily 1 week. He is to follow up as scheduled area thanks

## 2015-05-02 NOTE — Telephone Encounter (Signed)
LMOM

## 2015-05-03 ENCOUNTER — Ambulatory Visit
Admission: RE | Admit: 2015-05-03 | Discharge: 2015-05-03 | Disposition: A | Discharge status: Home / Self Care | Payer: BLUE CROSS/BLUE SHIELD | Source: Ambulatory Visit | Attending: Obstetrics and Gynecology | Admitting: Obstetrics and Gynecology

## 2015-05-03 DIAGNOSIS — N200 Calculus of kidney: Secondary | ICD-10-CM | POA: Diagnosis present

## 2015-05-03 DIAGNOSIS — N2889 Other specified disorders of kidney and ureter: Secondary | ICD-10-CM | POA: Insufficient documentation

## 2015-05-03 NOTE — Telephone Encounter (Signed)
Spoke with pt in reference to urinary symptoms. Pt denied any dysuria, n/v, f/c, back pain. Reinforced with pt if he develops any of those symptoms to give [place] a call back and f/u as planned. Pt voiced understanding.

## 2015-05-05 ENCOUNTER — Ambulatory Visit (INDEPENDENT_AMBULATORY_CARE_PROVIDER_SITE_OTHER): Payer: BLUE CROSS/BLUE SHIELD | Admitting: Obstetrics and Gynecology

## 2015-05-05 ENCOUNTER — Encounter: Payer: Self-pay | Admitting: Obstetrics and Gynecology

## 2015-05-05 VITALS — BP 138/87 | HR 80 | Resp 16 | Ht 71.0 in | Wt 215.8 lb

## 2015-05-05 DIAGNOSIS — N200 Calculus of kidney: Secondary | ICD-10-CM

## 2015-05-05 LAB — URINALYSIS, COMPLETE
BILIRUBIN UA: NEGATIVE
Glucose, UA: NEGATIVE
Ketones, UA: NEGATIVE
LEUKOCYTES UA: NEGATIVE
NITRITE UA: NEGATIVE
PH UA: 7 (ref 5.0–7.5)
Protein, UA: NEGATIVE
Specific Gravity, UA: 1.02 (ref 1.005–1.030)
UUROB: 0.2 mg/dL (ref 0.2–1.0)

## 2015-05-05 LAB — MICROSCOPIC EXAMINATION
BACTERIA UA: NONE SEEN
EPITHELIAL CELLS (NON RENAL): NONE SEEN /HPF (ref 0–10)

## 2015-05-05 NOTE — Progress Notes (Signed)
9:38 AM   FREEMONT KLUS Sep 10, 1975 [id number]  Referring provider: No referring provider defined for this encounter.  Chief Complaint  Patient presents with  . Results    KUB/RUS  . Nephrolithiasis  . Abdominal Pain    Lower abdomin: after voiding.    HPI: Patient is a 40 year old male with a history of kidney stones presenting today for follow-up after being seen in the emergency department on 04/24/15 for acute episode of right leg pain occurring over the last week. CT scan was performed noting a 5.5 x 5.0 mm mid distal ureteral calculus on the right side with moderate to high-grade obstruction with hydroureteronephrosis. No renal or bladder calculi visualized.  2-3 previous stone episodes all passed with medical management.  Pain resolved yesterday. Has been straining urine but has not seen stone pass.   Previous varicocelectomy a few years ago.   No fevers, nausea or vomiting.   Cr 1.46 slightly elevated from baseline of 1.30 WBC WNL  Interval History 05/05/15 Patient presents today for follow-up and review is renal ultrasound and KUB results. Renal ultrasound demonstrated resolution of previous hydronephrosis though KUB remarkable for evidence of remaining distal calculus. Patient has not noticed passing a stone. He denies any flank pain, fevers or gross hematuria. He does report some intermittent dysuria on occasion and some urinary urgency  PMH: Past Medical History  Diagnosis Date  . Kidney stones   . Kidney stones     Surgical History: No past surgical history on file.  Home Medications:    Medication List       This list is accurate as of: 05/05/15  9:38 AM.  Always use your most recent med list.               clonazePAM 0.5 MG tablet  Commonly known as:  KLONOPIN  Take 0.5 mg by mouth 2 (two) times daily.     naproxen 500 MG tablet  Commonly known as:  NAPROSYN  Take 1 tablet (500 mg total) by mouth 2 (two) times daily with a meal.     NUVIGIL  250 MG tablet  Generic drug:  Armodafinil  Take 250 mg by mouth every morning.     OLANZapine-FLUoxetine 3-25 MG capsule  Commonly known as:  SYMBYAX  Take 1 capsule by mouth at bedtime.     ondansetron 4 MG tablet  Commonly known as:  ZOFRAN  Take 1 tablet (4 mg total) by mouth daily as needed for nausea or vomiting.     oxyCODONE-acetaminophen 5-325 MG tablet  Commonly known as:  ROXICET  Take 1 tablet by mouth every 6 (six) hours as needed.     tamsulosin 0.4 MG Caps capsule  Commonly known as:  FLOMAX  Take 1 capsule (0.4 mg total) by mouth daily.        Allergies: No Known Allergies  Family History: Family History  Problem Relation Age of Onset  . Kidney failure Father     Social History:  reports that he has never smoked. His smokeless tobacco use includes Snuff. He reports that he drinks about 2.4 oz of alcohol per week. He reports that he does not use illicit drugs.  ROS: UROLOGY Frequent Urination?: No Hard to postpone urination?: No Burning/pain with urination?: No Get up at night to urinate?: No Leakage of urine?: No Urine stream starts and stops?: No Trouble starting stream?: No Do you have to strain to urinate?: No Blood in urine?: No Urinary tract infection?: No Sexually  transmitted disease?: No Injury to kidneys or bladder?: No Painful intercourse?: No Weak stream?: No Erection problems?: No Penile pain?: No  Gastrointestinal Nausea?: No Vomiting?: No Indigestion/heartburn?: No Diarrhea?: No Constipation?: No  Constitutional Fever: No Night sweats?: No Weight loss?: No Fatigue?: No  Skin Skin rash/lesions?: No Itching?: No  Eyes Blurred vision?: No Double vision?: No  Ears/Nose/Throat Sore throat?: No Sinus problems?: No  Hematologic/Lymphatic Swollen glands?: No Easy bruising?: No  Cardiovascular Leg swelling?: No Chest pain?: No  Respiratory Cough?: No Shortness of breath?: No  Endocrine Excessive thirst?:  No  Musculoskeletal Back pain?: No Joint pain?: No  Neurological Headaches?: No Dizziness?: No  Psychologic Depression?: No Anxiety?: No  Physical Exam: BP 138/87 mmHg  Pulse 80  Resp 16  Ht 5\' 11"  (1.803 m)  Wt 215 lb 12.8 oz (97.886 kg)  BMI 30.11 kg/m2  Constitutional:  Alert and oriented, No acute distress. HEENT: [place] AT, moist mucus membranes.  Trachea midline, no masses. Cardiovascular: No clubbing, cyanosis, or edema. Respiratory: Normal respiratory effort, no increased work of breathing. GI: Abdomen is soft, nontender, nondistended, no abdominal masses GU: No CVA tenderness.  Skin: No rashes, bruises or suspicious lesions. Lymph: No cervical or inguinal adenopathy. Neurologic: Grossly intact, no focal deficits, moving all 4 extremities. Psychiatric: Normal mood and affect.  Laboratory Data:  Lab Results  Component Value Date   WBC 10.6 04/24/2015   HGB 13.1 04/24/2015   HCT 37.0* 04/24/2015   MCV 88.7 04/24/2015   PLT 203 04/24/2015    Lab Results  Component Value Date   CREATININE 1.46* 04/24/2015    No results found for: PSA  No results found for: TESTOSTERONE  No results found for: HGBA1C  Urinalysis    Component Value Date/Time   COLORURINE YELLOW* 04/24/2015 1607   APPEARANCEUR CLEAR* 04/24/2015 1607   LABSPEC 1.028 04/24/2015 1607   PHURINE 6.0 04/24/2015 1607   GLUCOSEU Negative 04/25/2015 0919   HGBUR 2+* 04/24/2015 1607   BILIRUBINUR Negative 04/25/2015 0919   BILIRUBINUR NEGATIVE 04/24/2015 1607   KETONESUR NEGATIVE 04/24/2015 1607   PROTEINUR NEGATIVE 04/24/2015 1607   UROBILINOGEN 0.2 08/18/2014 0725   NITRITE Negative 04/25/2015 0919   NITRITE NEGATIVE 04/24/2015 1607   LEUKOCYTESUR Negative 04/25/2015 0919   LEUKOCYTESUR NEGATIVE 04/24/2015 1607    Pertinent Imaging: CLINICAL DATA: Right flank pain since Saturday. Hematuria.  EXAM: CT ABDOMEN AND PELVIS WITHOUT CONTRAST  TECHNIQUE: Multidetector CT imaging of  the abdomen and pelvis was performed following the standard protocol without IV contrast.  COMPARISON: CT scan 08/01/2014.  FINDINGS: Lower chest: The lung bases are clear except for dependent subpleural atelectasis. No pleural effusion or pulmonary lesions. The heart is normal in size. No pericardial effusion. The distal esophagus is grossly normal.  Hepatobiliary: No focal hepatic lesions or intrahepatic biliary dilatation. The gallbladder is normal. No common bile duct dilatation.  Pancreas: No mass, inflammation or ductal dilatation.  Spleen: Normal size. No focal lesions.  Adrenals/Urinary Tract: The adrenal glands are normal in stable.  No renal calculi are identified. There is moderate right-sided hydronephrosis and perinephric interstitial changes. Moderate right hydroureter down to an obstructing 5.5 x 5.0 mm calculus located just below the pelvic inlet. No left-sided ureteral calculi.  Stomach/Bowel: The stomach, duodenum, small bowel and colon are unremarkable. No inflammatory changes, mass lesions or obstructive findings. Scattered sigmoid diverticulosis without findings for acute diverticulitis. The terminal ileum is normal. The appendix is normal.  Vascular/Lymphatic: No mesenteric or retroperitoneal mass or adenopathy. The  aorta and branch vessels not demonstrate any atherosclerotic calcifications.  Other: The bladder, prostate gland and seminal vesicles are unremarkable. No pelvic mass or adenopathy. No free pelvic fluid collections. No inguinal mass or adenopathy.  Musculoskeletal: No significant bony findings.  IMPRESSION: 1. 5.5 x 5.0 mm mid distal ureteral calculus on the right side with moderate to high-grade obstruction with hydroureteronephrosis. 2. No renal, left ureteral or bladder calculi. 3. No other significant abdominal/pelvic findings. Electronically Signed  By: Marijo Sanes M.D.  On: 04/24/2015 16:05  CLINICAL DATA:  History of kidney stones, follow-up study, no current symptoms  EXAM: ABDOMEN - 1 VIEW  COMPARISON: Renal ultrasound of today's date CT scan of the abdomen pelvis of February 27th 2017.  FINDINGS: No abnormal calcifications are visualized over the kidneys. Along the course of the ureters no stones are evident with exception of the distal right ureter where at approximately 5 mm diameter calcification is observed. A second slightly more lateral 1-2 mm diameter calcification is also observed No phleboliths were demonstrated in this region on the previous CT scan.  The bowel gas pattern is normal. The bony structures exhibit no acute abnormality.  IMPRESSION: Two calcifications project in the right aspect of the true bony pelvis and may reflect distal right ureteral stones.  CLINICAL DATA: History of kidney stones demonstrated on CT scan of April 24, 2015. Right-sided pelvocaliectasis demonstrated on ultrasound of April 21, 2015.  EXAM: RENAL / URINARY TRACT ULTRASOUND COMPLETE  COMPARISON: CT urogram of April 24, 2015 and renal ultrasound of April 21, 2015  FINDINGS: Right Kidney:  Length: 12.6 cm. Mild prominence of the renal pelvis is observed without definite hydronephrosis. No renal stones are observed.  Left Kidney:  Length: 12.4 cm. Echogenicity within normal limits. No mass or hydronephrosis visualized. No renal stones are observed.  Bladder:  Appears normal for degree of bladder distention. No urinary bladder stones are observed. Bilateral ureteral jets were observed.  IMPRESSION: No evidence of hydronephrosis nor calcified stones within the kidneys or urinary bladder.  Assessment & Plan:    1. Nephrolithiasis- 5.5 x 5.0 mm mid distal ureteral calculus on the right side with moderate to high-grade obstruction with hydroureteronephrosis. -provided pocket urine strainer - broke out in rash on lower back after taking Flomax-  no longer taking -check BMP today to ensure kidney function OK -RUS showing resolution of hydronephrosis.  KUB demonstrating remaining distal stone. Continued mild intermittent dysuria.  No flank pain or fevers. She would like to continue medical expulsive therapy. KUB 2 weeks-  Return precautions reviewed. Push fluids.  2. Microscopic Hematuria- Recheck UA at follow-up visit to confirm resolution once ureteral stone has passed.  There are no diagnoses linked to this encounter.  Return in about 2 weeks (around 05/19/2015) for with [facility] KUB prior.  These notes generated with voice recognition software. I apologize for typographical errors.  Herbert Moors, [facility] [address] [place], [place] [address] [phone]

## 2015-05-06 LAB — BASIC METABOLIC PANEL
BUN/Creatinine Ratio: 17 (ref 9–20)
BUN: 20 mg/dL (ref 6–24)
CALCIUM: 9.7 mg/dL (ref 8.7–10.2)
CO2: 29 mmol/L (ref 18–29)
CREATININE: 1.16 mg/dL (ref 0.76–1.27)
Chloride: 98 mmol/L (ref 96–106)
GFR calc Af Amer: 91 mL/min/{1.73_m2} (ref >59)
GFR, EST NON AFRICAN AMERICAN: 78 mL/min/{1.73_m2} (ref >59)
Glucose: 89 mg/dL (ref 65–99)
Potassium: 4.8 mmol/L (ref 3.5–5.2)
Sodium: 143 mmol/L (ref 134–144)

## 2015-05-08 ENCOUNTER — Telehealth: Payer: Self-pay

## 2015-05-08 NOTE — Telephone Encounter (Signed)
LMOM- kidney function improving. F/u in 2 weeks with KUB prior.

## 2015-05-08 NOTE — Telephone Encounter (Signed)
-----   Message from Roda Shutters, [place] sent at 05/08/2015  8:29 AM EDT ----- Please notify patient that his kidney function has continued to improve. He will follow up in 2 weeks as scheduled with KUB prior. Thanks

## 2015-05-12 ENCOUNTER — Ambulatory Visit
Admission: RE | Admit: 2015-05-12 | Discharge: 2015-05-12 | Disposition: A | Discharge status: Home / Self Care | Payer: BLUE CROSS/BLUE SHIELD | Source: Ambulatory Visit | Attending: Obstetrics and Gynecology | Admitting: Obstetrics and Gynecology

## 2015-05-12 ENCOUNTER — Ambulatory Visit (INDEPENDENT_AMBULATORY_CARE_PROVIDER_SITE_OTHER): Payer: BLUE CROSS/BLUE SHIELD | Admitting: Urology

## 2015-05-12 ENCOUNTER — Other Ambulatory Visit: Payer: Self-pay | Admitting: Obstetrics and Gynecology

## 2015-05-12 ENCOUNTER — Encounter: Payer: Self-pay | Admitting: Urology

## 2015-05-12 VITALS — BP 132/83 | HR 84 | Ht 71.0 in | Wt 202.4 lb

## 2015-05-12 DIAGNOSIS — N200 Calculus of kidney: Secondary | ICD-10-CM | POA: Insufficient documentation

## 2015-05-12 DIAGNOSIS — N132 Hydronephrosis with renal and ureteral calculous obstruction: Secondary | ICD-10-CM | POA: Diagnosis not present

## 2015-05-12 DIAGNOSIS — R3129 Other microscopic hematuria: Secondary | ICD-10-CM | POA: Diagnosis not present

## 2015-05-12 MED ORDER — TAMSULOSIN HCL 0.4 MG PO CAPS: 0.4000 mg | ORAL_CAPSULE | Freq: Every day | ORAL | Status: DC

## 2015-05-12 NOTE — Progress Notes (Signed)
10:29 AM   Dale Davis [birth year] [id number]  Referring provider: No referring provider defined for this encounter.  Chief Complaint  Patient presents with  . Follow-up  . Results    KUB    HPI: Patient is a 40 year old male with who has passed a right distal ureteral calculus who presents today for a scheduled follow up.    Background story Patient with a history of kidney stones presenting today for follow-up after being seen in the emergency department on 04/24/15 for acute episode of right leg pain.  CT scan was performed noting a 5.5 x 5.0 mm mid distal ureteral calculus on the right side with moderate to high-grade obstruction with hydroureteronephrosis.  No renal or bladder calculi visualized.  2-3 previous stone episodes all passed with medical management.   Renal ultrasound from 05/05/2015 demonstrated resolution of previous hydronephrosis though KUB remarkable for evidence of remaining distal calculus.   Previous varicocelectomy a few years ago.   No fevers, nausea or vomiting.   Cr 1.46 slightly elevated from baseline of 1.30 WBC WNL  Today, he has brought in a stone fragment.  He has not had any further flank pain, gross hematuria or dysuria.  He has not had any recent fevers, chills, nausea or vomiting.  There may be a remaining fragment in the bladder on KUB taken today.  His UA is unremarkable on today's exam.    PMH: Past Medical History  Diagnosis Date  . Kidney stones   . Kidney stones     Surgical History: No past surgical history on file.  Home Medications:    Medication List       This list is accurate as of: 05/12/15 10:29 AM.  Always use your most recent med list.               clonazePAM 0.5 MG tablet  Commonly known as:  KLONOPIN  Take 0.5 mg by mouth 2 (two) times daily.     naproxen 500 MG tablet  Commonly known as:  NAPROSYN  Take 1 tablet (500 mg total) by mouth 2 (two) times daily with a meal.     NUVIGIL 250 MG tablet   Generic drug:  Armodafinil  Take 250 mg by mouth every morning.     OLANZapine-FLUoxetine 3-25 MG capsule  Commonly known as:  SYMBYAX  Take 1 capsule by mouth at bedtime.     tamsulosin 0.4 MG Caps capsule  Commonly known as:  FLOMAX  Take 1 capsule (0.4 mg total) by mouth daily.     tamsulosin 0.4 MG Caps capsule  Commonly known as:  FLOMAX  Take 1 capsule (0.4 mg total) by mouth daily.        Allergies: No Known Allergies  Family History: Family History  Problem Relation Age of Onset  . Kidney failure Father     Social History:  reports that he has never smoked. His smokeless tobacco use includes Snuff. He reports that he drinks about 2.4 oz of alcohol per week. He reports that he does not use illicit drugs.  ROS: UROLOGY Frequent Urination?: No Hard to postpone urination?: No Burning/pain with urination?: No Get up at night to urinate?: No Leakage of urine?: No Urine stream starts and stops?: No Trouble starting stream?: No Do you have to strain to urinate?: No Blood in urine?: No Urinary tract infection?: No Sexually transmitted disease?: No Injury to kidneys or bladder?: No Painful intercourse?: No Weak stream?: No Erection problems?: No  Penile pain?: No  Gastrointestinal Nausea?: No Vomiting?: No Indigestion/heartburn?: No Diarrhea?: No Constipation?: No  Constitutional Fever: No Night sweats?: No Weight loss?: No Fatigue?: No  Skin Skin rash/lesions?: No Itching?: No  Eyes Blurred vision?: No Double vision?: No  Ears/Nose/Throat Sore throat?: No Sinus problems?: No  Hematologic/Lymphatic Swollen glands?: No Easy bruising?: No  Cardiovascular Leg swelling?: No Chest pain?: No  Respiratory Cough?: No Shortness of breath?: No  Endocrine Excessive thirst?: No  Musculoskeletal Back pain?: No Joint pain?: No  Neurological Headaches?: No Dizziness?: No  Psychologic Depression?: No Anxiety?: No  Physical Exam: BP  132/83 mmHg  Pulse 84  Ht 5\' 11"  (1.803 m)  Wt 202 lb 6.4 oz (91.808 kg)  BMI 28.24 kg/m2  Constitutional:  Alert and oriented, No acute distress. HEENT: [place] AT, moist mucus membranes.  Trachea midline, no masses. Cardiovascular: No clubbing, cyanosis, or edema. Respiratory: Normal respiratory effort, no increased work of breathing. GI: Abdomen is soft, nontender, nondistended, no abdominal masses GU: No CVA tenderness.  Skin: No rashes, bruises or suspicious lesions. Lymph: No cervical or inguinal adenopathy. Neurologic: Grossly intact, no focal deficits, moving all 4 extremities. Psychiatric: Normal mood and affect.  Laboratory Data:  Lab Results  Component Value Date   WBC 10.6 04/24/2015   HGB 13.1 04/24/2015   HCT 37.0* 04/24/2015   MCV 88.7 04/24/2015   PLT 203 04/24/2015    Lab Results  Component Value Date   CREATININE 1.16 05/05/2015    Urinalysis Results for orders placed or performed in visit on 05/12/15  Microscopic Examination  Result Value Ref Range   WBC, UA 0-5 0 -  5 /hpf   RBC, UA 0-2 0 -  2 /hpf   Epithelial Cells (non renal) None seen 0 - 10 /hpf   Bacteria, UA None seen None seen/Few  Urinalysis, Complete  Result Value Ref Range   Specific Gravity, UA 1.020 1.005 - 1.030   pH, UA 6.0 5.0 - 7.5   Color, UA Yellow Yellow   Appearance Ur Clear Clear   Leukocytes, UA Negative Negative   Protein, UA Negative Negative/Trace   Glucose, UA Negative Negative   Ketones, UA Negative Negative   RBC, UA Negative Negative   Bilirubin, UA Negative Negative   Urobilinogen, Ur 0.2 0.2 - 1.0 mg/dL   Nitrite, UA Negative Negative   Microscopic Examination See below:     Pertinent Imaging: CLINICAL DATA: Right flank pain since Saturday. Hematuria.  EXAM: CT ABDOMEN AND PELVIS WITHOUT CONTRAST  TECHNIQUE: Multidetector CT imaging of the abdomen and pelvis was performed following the standard protocol without IV contrast.  COMPARISON: CT scan  08/01/2014.  FINDINGS: Lower chest: The lung bases are clear except for dependent subpleural atelectasis. No pleural effusion or pulmonary lesions. The heart is normal in size. No pericardial effusion. The distal esophagus is grossly normal.  Hepatobiliary: No focal hepatic lesions or intrahepatic biliary dilatation. The gallbladder is normal. No common bile duct dilatation.  Pancreas: No mass, inflammation or ductal dilatation.  Spleen: Normal size. No focal lesions.  Adrenals/Urinary Tract: The adrenal glands are normal in stable.  No renal calculi are identified. There is moderate right-sided hydronephrosis and perinephric interstitial changes. Moderate right hydroureter down to an obstructing 5.5 x 5.0 mm calculus located just below the pelvic inlet. No left-sided ureteral calculi.  Stomach/Bowel: The stomach, duodenum, small bowel and colon are unremarkable. No inflammatory changes, mass lesions or obstructive findings. Scattered sigmoid diverticulosis without findings for acute diverticulitis. The  terminal ileum is normal. The appendix is normal.  Vascular/Lymphatic: No mesenteric or retroperitoneal mass or adenopathy. The aorta and branch vessels not demonstrate any atherosclerotic calcifications.  Other: The bladder, prostate gland and seminal vesicles are unremarkable. No pelvic mass or adenopathy. No free pelvic fluid collections. No inguinal mass or adenopathy.  Musculoskeletal: No significant bony findings.  IMPRESSION: 1. 5.5 x 5.0 mm mid distal ureteral calculus on the right side with moderate to high-grade obstruction with hydroureteronephrosis. 2. No renal, left ureteral or bladder calculi. 3. No other significant abdominal/pelvic findings. Electronically Signed  By: Marijo Sanes M.D.  On: 04/24/2015 16:05  CLINICAL DATA: History of kidney stones, follow-up study, no current symptoms  EXAM: ABDOMEN - 1 VIEW  COMPARISON: Renal  ultrasound of today's date CT scan of the abdomen pelvis of February 27th 2017.  FINDINGS: No abnormal calcifications are visualized over the kidneys. Along the course of the ureters no stones are evident with exception of the distal right ureter where at approximately 5 mm diameter calcification is observed. A second slightly more lateral 1-2 mm diameter calcification is also observed No phleboliths were demonstrated in this region on the previous CT scan.  The bowel gas pattern is normal. The bony structures exhibit no acute abnormality.  IMPRESSION: Two calcifications project in the right aspect of the true bony pelvis and may reflect distal right ureteral stones.  CLINICAL DATA: History of kidney stones demonstrated on CT scan of April 24, 2015. Right-sided pelvocaliectasis demonstrated on ultrasound of April 21, 2015.  EXAM: RENAL / URINARY TRACT ULTRASOUND COMPLETE  COMPARISON: CT urogram of April 24, 2015 and renal ultrasound of April 21, 2015  FINDINGS: Right Kidney:  Length: 12.6 cm. Mild prominence of the renal pelvis is observed without definite hydronephrosis. No renal stones are observed.  Left Kidney:  Length: 12.4 cm. Echogenicity within normal limits. No mass or hydronephrosis visualized. No renal stones are observed.  Bladder:  Appears normal for degree of bladder distention. No urinary bladder stones are observed. Bilateral ureteral jets were observed.  IMPRESSION: No evidence of hydronephrosis nor calcified stones within the kidneys or urinary bladder.  CLINICAL DATA: Right side nephrolithiasis  EXAM: ABDOMEN - 1 VIEW  COMPARISON: 05/03/2015, CT 04/24/2015.  FINDINGS: Previously seen calcification in the right anatomic pelvis now projects over the midline in the lower pelvis, possibly within the bladder measuring 5 mm. No additional suspicious calcification. Nonobstructive bowel gas pattern. No organomegaly  or free air.  IMPRESSION: 5 mm calcification projects near the midline in the lower pelvis, possibly stone now within the bladder.   Electronically Signed  By: Rolm Baptise M.D.  On: 05/12/2015 09:34  Assessment & Plan:    1. Nephrolithiasis:   Patient's stone may be in pieces.  He has brought some fragments in for analysis and there may be a fragment remaining in the urinary bladder.    2. Microscopic Hematuria:   No hematuria seen on today's UA.  We will continue to monitor.    3. Hydronephrosis:   Patient has passed another fragment.  We will obtain another RUS in one month to ensure no new hydronephrosis has occurred.    Return in about 1 month (around 06/12/2015) for RUS report .  These notes generated with voice recognition software. I apologize for typographical errors.  Zara Council, [facility] [address], [place], [place] [address] [phone]

## 2015-05-13 LAB — URINALYSIS, COMPLETE
Bilirubin, UA: NEGATIVE
Glucose, UA: NEGATIVE
KETONES UA: NEGATIVE
Leukocytes, UA: NEGATIVE
NITRITE UA: NEGATIVE
Protein, UA: NEGATIVE
RBC, UA: NEGATIVE
SPEC GRAV UA: 1.02 (ref 1.005–1.030)
UUROB: 0.2 mg/dL (ref 0.2–1.0)
pH, UA: 6 (ref 5.0–7.5)

## 2015-05-13 LAB — MICROSCOPIC EXAMINATION
Bacteria, UA: NONE SEEN
EPITHELIAL CELLS (NON RENAL): NONE SEEN /HPF (ref 0–10)

## 2015-05-17 DIAGNOSIS — N132 Hydronephrosis with renal and ureteral calculous obstruction: Secondary | ICD-10-CM | POA: Insufficient documentation

## 2015-05-17 DIAGNOSIS — N200 Calculus of kidney: Secondary | ICD-10-CM | POA: Insufficient documentation

## 2015-05-19 ENCOUNTER — Ambulatory Visit: Payer: BLUE CROSS/BLUE SHIELD | Admitting: Urology

## 2015-06-05 ENCOUNTER — Ambulatory Visit
Admission: RE | Admit: 2015-06-05 | Discharge: 2015-06-05 | Disposition: A | Discharge status: Home / Self Care | Payer: BLUE CROSS/BLUE SHIELD | Source: Ambulatory Visit | Attending: Urology | Admitting: Urology

## 2015-06-05 DIAGNOSIS — N132 Hydronephrosis with renal and ureteral calculous obstruction: Secondary | ICD-10-CM | POA: Diagnosis present

## 2015-06-05 DIAGNOSIS — Z09 Encounter for follow-up examination after completed treatment for conditions other than malignant neoplasm: Secondary | ICD-10-CM | POA: Diagnosis not present

## 2015-06-05 DIAGNOSIS — Z87442 Personal history of urinary calculi: Secondary | ICD-10-CM | POA: Diagnosis not present

## 2015-06-12 ENCOUNTER — Encounter: Payer: Self-pay | Admitting: Urology

## 2015-06-12 ENCOUNTER — Ambulatory Visit (INDEPENDENT_AMBULATORY_CARE_PROVIDER_SITE_OTHER): Payer: BLUE CROSS/BLUE SHIELD | Admitting: Urology

## 2015-06-12 VITALS — BP 118/83 | HR 84 | Ht 71.0 in | Wt 216.8 lb

## 2015-06-12 DIAGNOSIS — R3129 Other microscopic hematuria: Secondary | ICD-10-CM | POA: Diagnosis not present

## 2015-06-12 DIAGNOSIS — N200 Calculus of kidney: Secondary | ICD-10-CM

## 2015-06-12 DIAGNOSIS — N132 Hydronephrosis with renal and ureteral calculous obstruction: Secondary | ICD-10-CM

## 2015-06-12 LAB — URINALYSIS, COMPLETE
Bilirubin, UA: NEGATIVE
GLUCOSE, UA: NEGATIVE
KETONES UA: NEGATIVE
Leukocytes, UA: NEGATIVE
NITRITE UA: NEGATIVE
Protein, UA: NEGATIVE
RBC UA: NEGATIVE
Specific Gravity, UA: 1.025 (ref 1.005–1.030)
UUROB: 0.2 mg/dL (ref 0.2–1.0)
pH, UA: 5.5 (ref 5.0–7.5)

## 2015-06-12 LAB — MICROSCOPIC EXAMINATION: RBC MICROSCOPIC, UA: NONE SEEN /HPF (ref 0–?)

## 2015-06-12 NOTE — Progress Notes (Signed)
10:11 AM   Dale Davis 05/22/1975 [id number]  Referring provider: No referring provider defined for this encounter.  Chief Complaint  Patient presents with  . Follow-up    Renal stones , diagnose study(renal US)    HPI: Patient is a 40 year old Caucasian male with who has passed a right distal ureteral calculus who presents today for a scheduled follow up to discuss his RUS results and stone analysis.     Background story Patient with a history of kidney stones presenting today for follow-up after being seen in the emergency department on 04/24/15 for acute episode of right leg pain.  CT scan was performed noting a 5.5 x 5.0 mm mid distal ureteral calculus on the right side with moderate to high-grade obstruction with hydroureteronephrosis.  No renal or bladder calculi visualized.  2-3 previous stone episodes all passed with medical management.   Renal ultrasound from 05/05/2015 demonstrated resolution of previous hydronephrosis though KUB remarkable for evidence of remaining distal calculus.   Today, the patient has not experienced any further flank pain. He denies gross hematuria. His UA today is unremarkable.  Stone analysis revealed a 77% calcium oxalate monohydrate, 16% calcium oxalate dihydrate and 7% calcium phosphate carbonate stone.  Renal ultrasound completed on 06/05/2015 noted a normal ultrasound of the kidneys and urinary bladder. No obstructing or nonobstructing stones were demonstrated. No hydronephrosis was seen.  I personally reviewed the films with the patient.   PMH: Past Medical History  Diagnosis Date  . Kidney stones   . Kidney stones     Surgical History: No past surgical history on file.  Home Medications:    Medication List       This list is accurate as of: 06/12/15 10:11 AM.  Always use your most recent med list.               clonazePAM 0.5 MG tablet  Commonly known as:  KLONOPIN  Take 0.5 mg by mouth 2 (two) times daily.     NUVIGIL 250  MG tablet  Generic drug:  Armodafinil  Take 250 mg by mouth every morning.     OLANZapine-FLUoxetine 3-25 MG capsule  Commonly known as:  SYMBYAX  Take 1 capsule by mouth at bedtime.        Allergies:  Allergies  Allergen Reactions  . Flomax [Tamsulosin Hcl] Rash    Family History: Family History  Problem Relation Age of Onset  . Kidney failure Father     Social History:  reports that he has never smoked. His smokeless tobacco use includes Chew. He reports that he drinks about 2.4 oz of alcohol per week. He reports that he does not use illicit drugs.  ROS: UROLOGY Frequent Urination?: No Hard to postpone urination?: No Burning/pain with urination?: No Get up at night to urinate?: No Leakage of urine?: No Urine stream starts and stops?: No Trouble starting stream?: No Do you have to strain to urinate?: No Blood in urine?: No Urinary tract infection?: No Sexually transmitted disease?: No Injury to kidneys or bladder?: No Painful intercourse?: No Weak stream?: No Erection problems?: No Penile pain?: No  Gastrointestinal Nausea?: No Vomiting?: No Indigestion/heartburn?: No Diarrhea?: No Constipation?: No  Constitutional Fever: No Night sweats?: No Weight loss?: No Fatigue?: No  Skin Skin rash/lesions?: No Itching?: No  Eyes Blurred vision?: No Double vision?: No  Ears/Nose/Throat Sore throat?: No Sinus problems?: No  Hematologic/Lymphatic Swollen glands?: No Easy bruising?: No  Cardiovascular Leg swelling?: No Chest pain?: No  Respiratory Cough?: No Shortness of breath?: No  Endocrine Excessive thirst?: No  Musculoskeletal Back pain?: No Joint pain?: No  Neurological Headaches?: No Dizziness?: No  Psychologic Depression?: No Anxiety?: No  Physical Exam: BP 118/83 mmHg  Pulse 84  Ht 5\' 11"  (1.803 m)  Wt 216 lb 12.8 oz (98.34 kg)  BMI 30.25 kg/m2  Constitutional:  Alert and oriented, No acute distress. HEENT: [place] AT,  moist mucus membranes.  Trachea midline, no masses. Cardiovascular: No clubbing, cyanosis, or edema. Respiratory: Normal respiratory effort, no increased work of breathing. GI: Abdomen is soft, nontender, nondistended, no abdominal masses GU: No CVA tenderness.  Skin: No rashes, bruises or suspicious lesions. Lymph: No cervical or inguinal adenopathy. Neurologic: Grossly intact, no focal deficits, moving all 4 extremities. Psychiatric: Normal mood and affect.  Laboratory Data:  Lab Results  Component Value Date   WBC 10.6 04/24/2015   HGB 13.1 04/24/2015   HCT 37.0* 04/24/2015   MCV 88.7 04/24/2015   PLT 203 04/24/2015    Lab Results  Component Value Date   CREATININE 1.16 05/05/2015    Urinalysis Results for orders placed or performed in visit on 06/12/15  Microscopic Examination  Result Value Ref Range   WBC, UA 0-5 0 -  5 /hpf   RBC, UA None seen 0 -  2 /hpf   Epithelial Cells (non renal) 0-10 0 - 10 /hpf   Mucus, UA Present (A) Not Estab.   Bacteria, UA Few (A) None seen/Few  Urinalysis, Complete  Result Value Ref Range   Specific Gravity, UA 1.025 1.005 - 1.030   pH, UA 5.5 5.0 - 7.5   Color, UA Yellow Yellow   Appearance Ur Clear Clear   Leukocytes, UA Negative Negative   Protein, UA Negative Negative/Trace   Glucose, UA Negative Negative   Ketones, UA Negative Negative   RBC, UA Negative Negative   Bilirubin, UA Negative Negative   Urobilinogen, Ur 0.2 0.2 - 1.0 mg/dL   Nitrite, UA Negative Negative   Microscopic Examination See below:    Pertinent Imaging:  CLINICAL DATA: Follow-up of right-sided hydronephrosis due to a distal calculus; passed a stone 1 month ago  EXAM: RENAL / URINARY TRACT ULTRASOUND COMPLETE  COMPARISON: CT scan of the abdomen of April 24, 2015 and renal ultrasound of May 03, 2015  FINDINGS: Right Kidney:  Length: 12.9 cm. Echogenicity within normal limits. No mass or hydronephrosis visualized. No stones are  demonstrated.  Left Kidney:  Length: 13.1 cm. Echogenicity within normal limits. No mass or hydronephrosis visualized. No stones are demonstrated.  Bladder:  Appears normal for degree of bladder distention. No stones are evident.  IMPRESSION: Normal ultrasound of the kidneys and urinary bladder. No obstructing or nonobstructing stones are demonstrated.   Electronically Signed  By: David Martinique M.D.  On: 06/05/2015 16:03         Assessment & Plan:    1. Nephrolithiasis:   Patient is encouraged to increase his fluid intake to 2.5 L daily, reduce soda consumption, reduced sodium and protein consumption.  He does not wish to undergo 24-hour urine at this time.  He will contact our office if he should experience any episodes of flank pain or gross hematuria.  2. Microscopic Hematuria:   No hematuria seen on today's UA.  Patient will contact us if he experiences any episodes of gross hematuria or sound to have I prescribed a hematuria.   3. Hydronephrosis:   Hydronephrosis has resolved.  Return if symptoms worsen  or fail to improve.  These notes generated with voice recognition software. I apologize for typographical errors.  Zara Council, [facility] [address] [place], [place] [address] [phone]

## 2015-06-27 ENCOUNTER — Other Ambulatory Visit: Payer: Self-pay | Admitting: Urology

## 2016-09-19 IMAGING — US US RENAL
1 series · 14 of 25 positions shown · non-contrast
Comparison: Renal ultrasound performed 08/18/2014, and CT of the
abdomen and pelvis performed 08/01/2014

CLINICAL DATA: Acute onset of right flank and right pelvic pain.
Initial encounter.

EXAM:
RENAL / URINARY TRACT ULTRASOUND COMPLETE

[Series 1: us renal · 0.26mm/px · 14 of 31 slices shown]
[im 1/31]
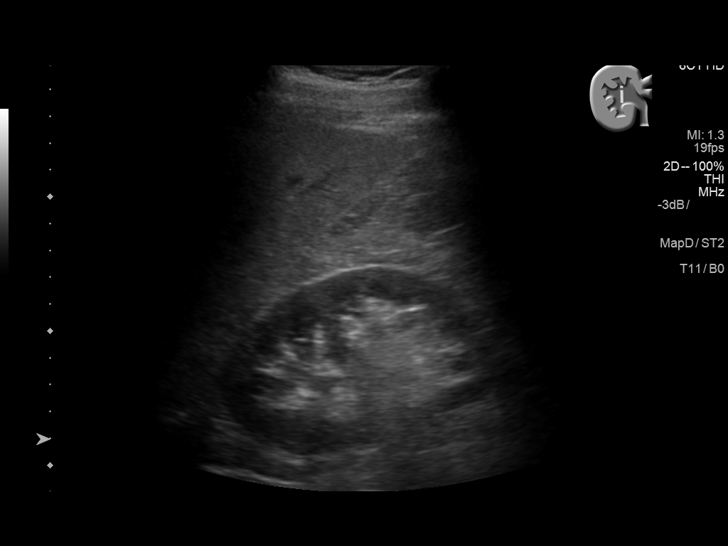
[im 3/31]
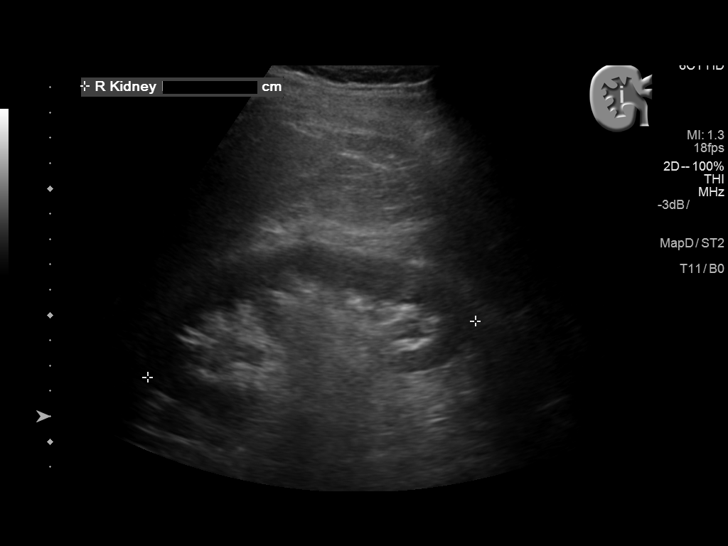
[im 6/31]
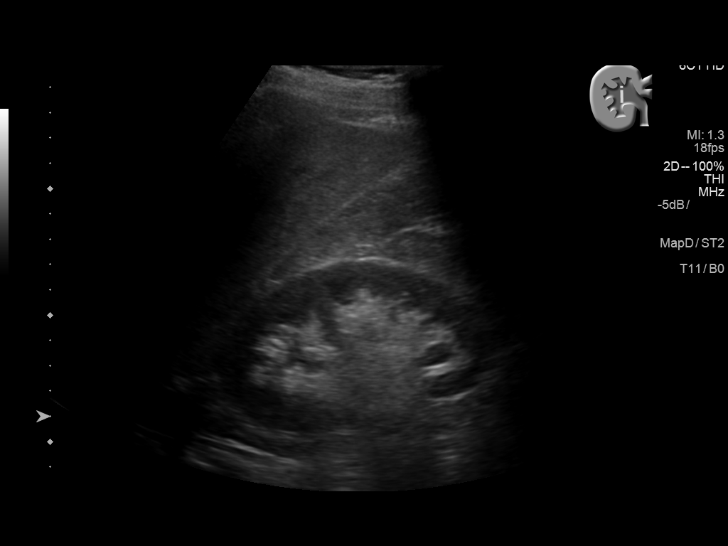
[im 8/31]
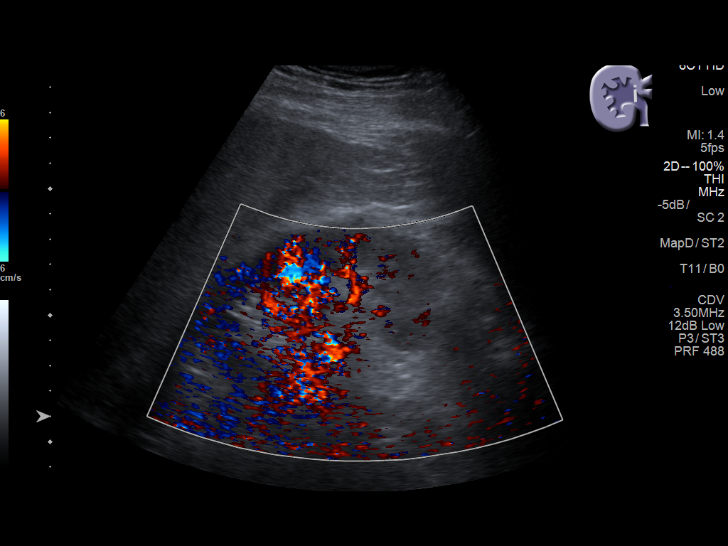
[im 11/31]
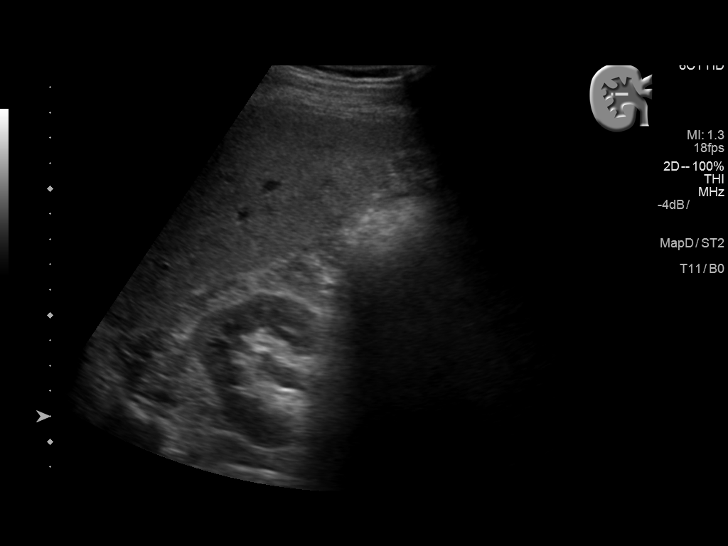
[im 12/31]
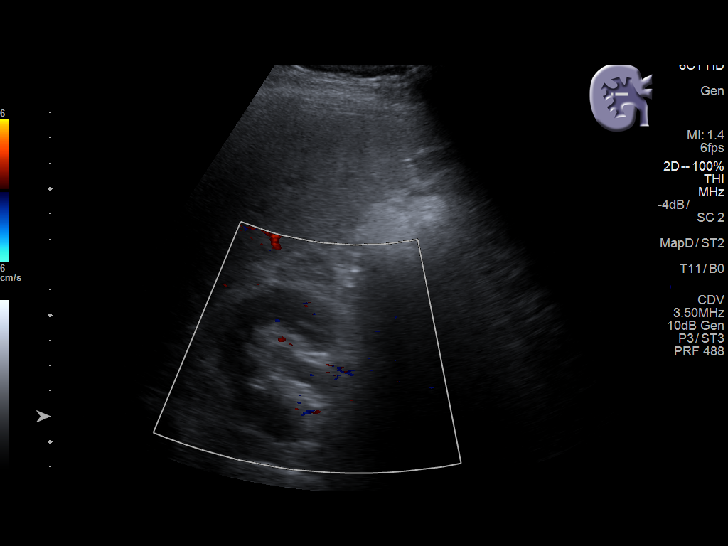
[im 14/31]
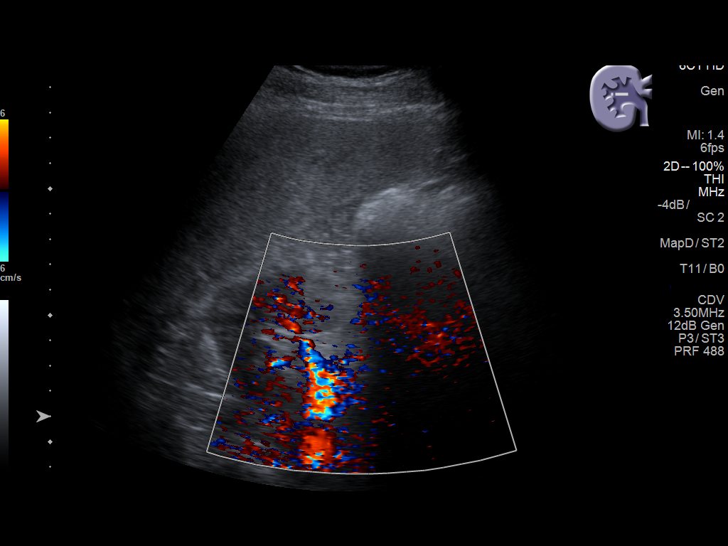
[im 17/31]
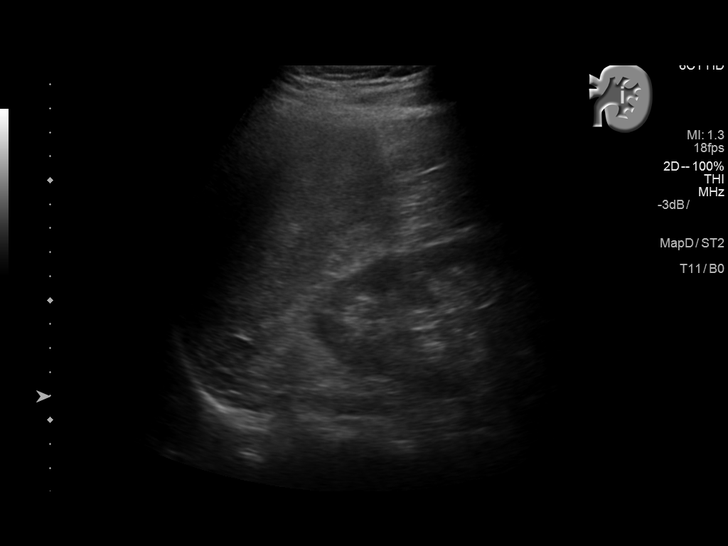
[im 19/31]
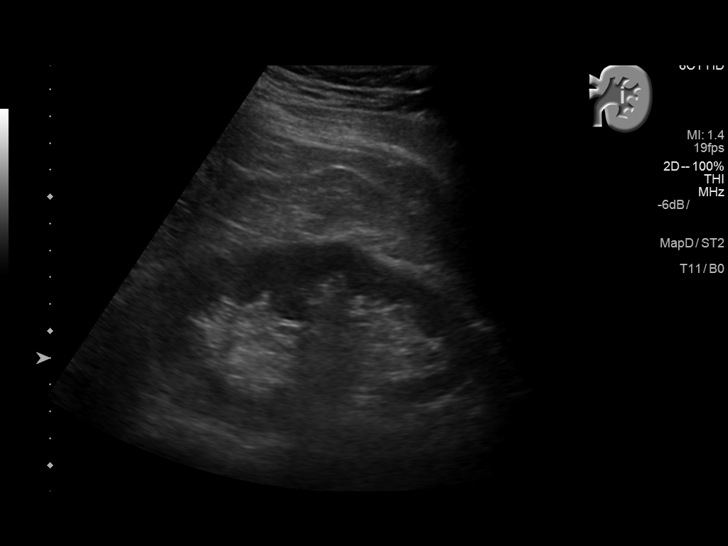
[im 21/31]
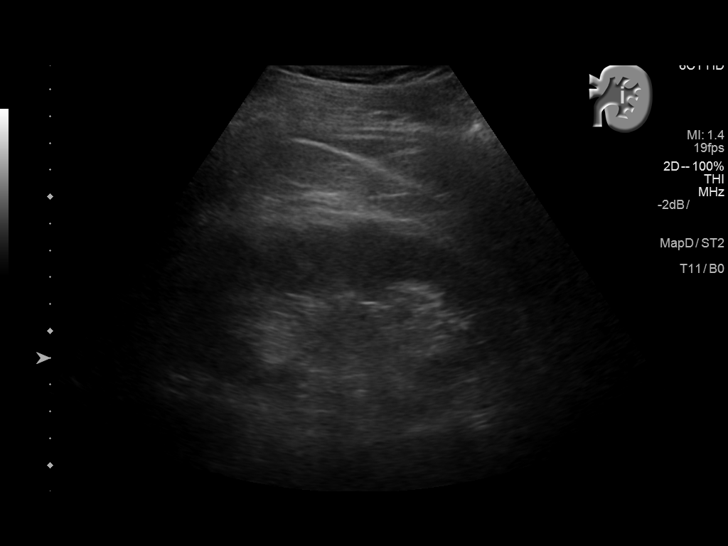
[im 23/31]
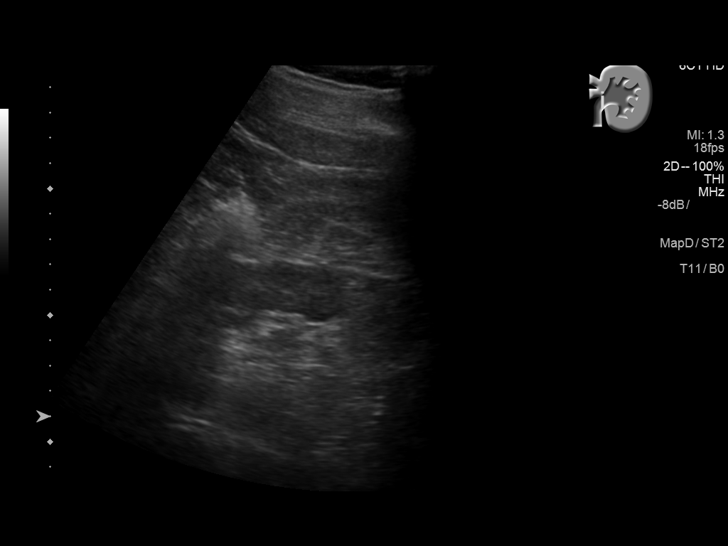
[im 26/31]
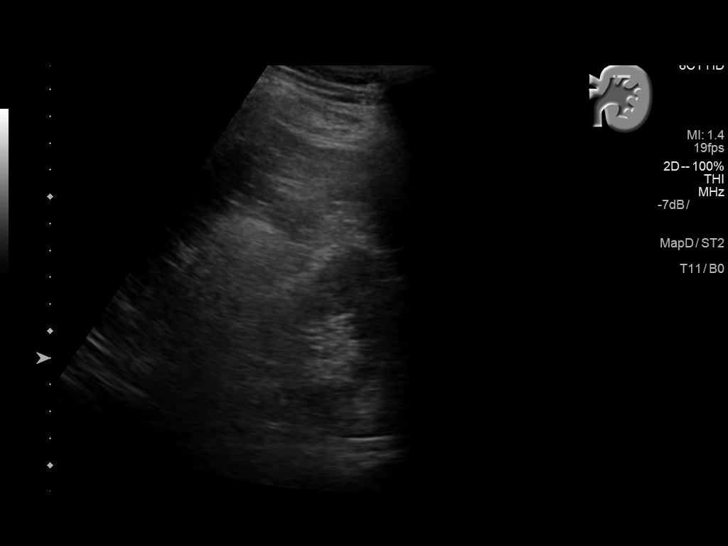
[im 28/31]
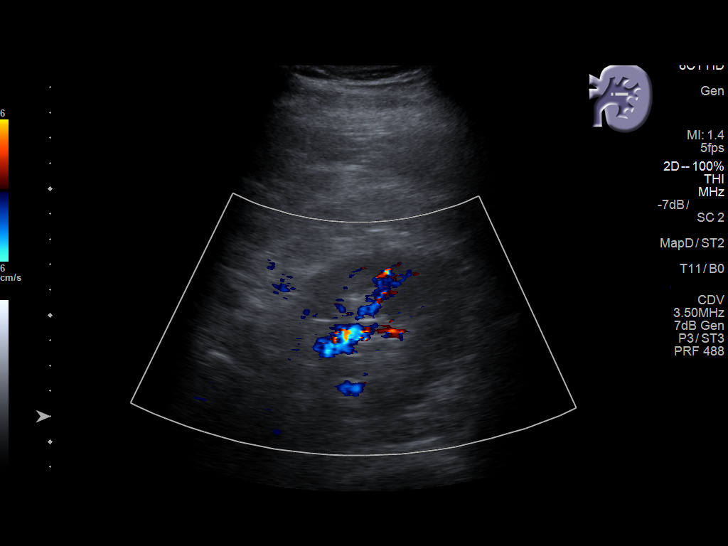
[im 31/31]
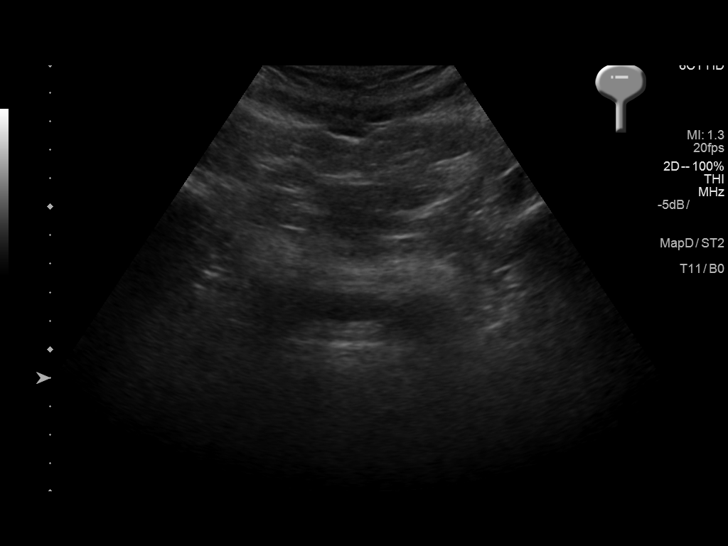

[14 of 25 positions shown; findings below may reference images not displayed]

FINDINGS: Right Kidney:

Length: 13.1 cm. Echogenicity within normal limits. Mild
pelvicaliectasis remains within normal limits. No mass or
hydronephrosis visualized.

Left Kidney:

Length: 12.9 cm. Echogenicity within normal limits. No mass or
hydronephrosis visualized.

Bladder:

Relatively decompressed and not well characterized.
IMPRESSION: 1. Mild right-sided pelvicaliectasis remains within normal limits.
The patient's pain stopped when he gave a urine sample shortly
before the study; he believes he passed the stone at that time.
2. Previously noted tiny bilateral renal stones are not well
characterized on this study.

## 2016-10-01 IMAGING — US US RENAL
1 series · 14 of 25 positions shown · non-contrast
Comparison: CT urogram April 24, 2015 and renal ultrasound April 21, 2015

CLINICAL DATA: History of kidney stones demonstrated on CT scan April 24, 2015. Right-sided pelvocaliectasis demonstrated on
ultrasound April 21, 2015.

EXAM:
RENAL / URINARY TRACT ULTRASOUND COMPLETE

[Series 1: us renal · 0.26mm/px · 14 of 50 slices shown]
[im 1/50]
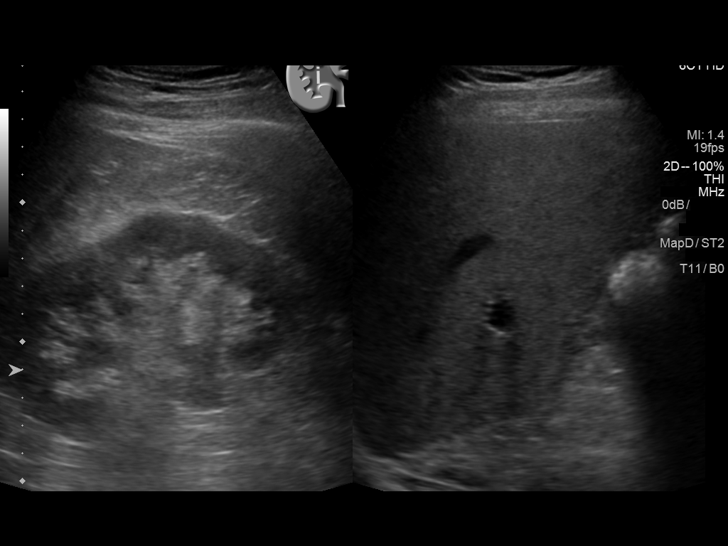
[im 5/50]
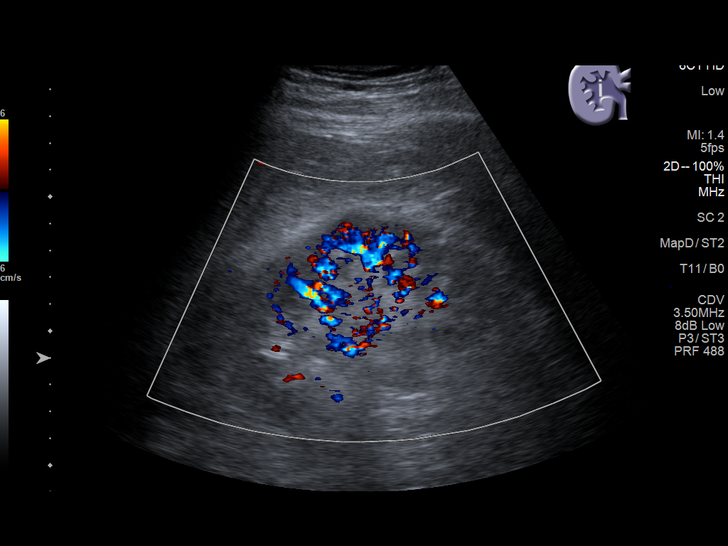
[im 9/50]
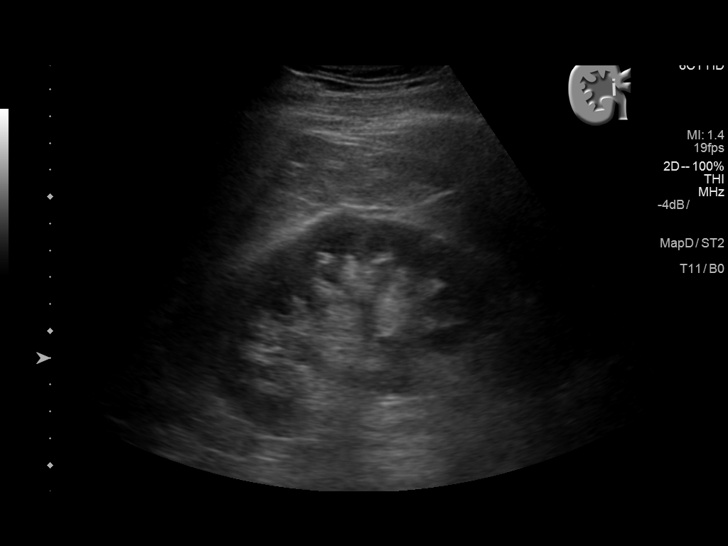
[im 13/50]
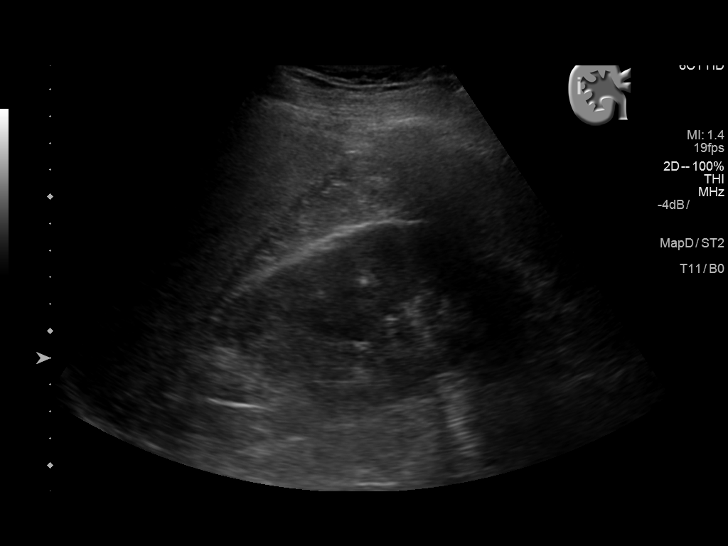
[im 17/50]
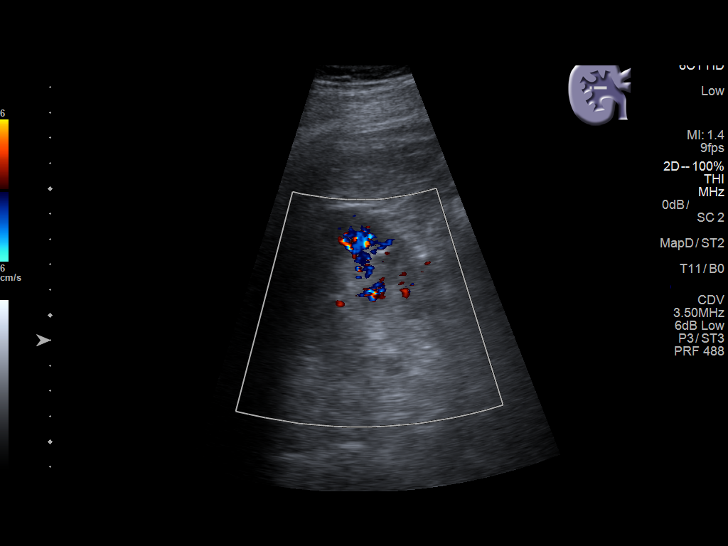
[im 19/50]
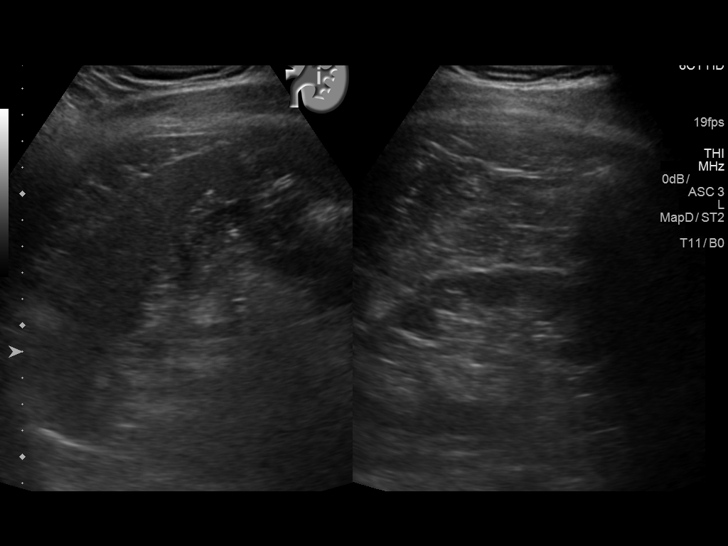
[im 23/50]
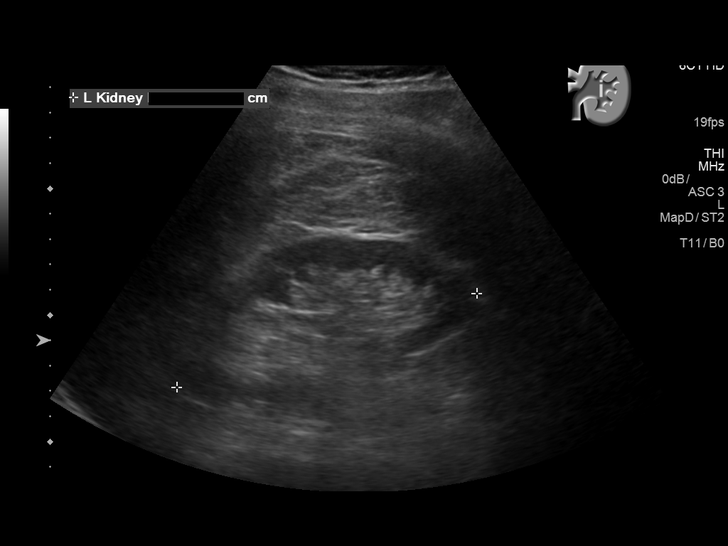
[im 27/50]
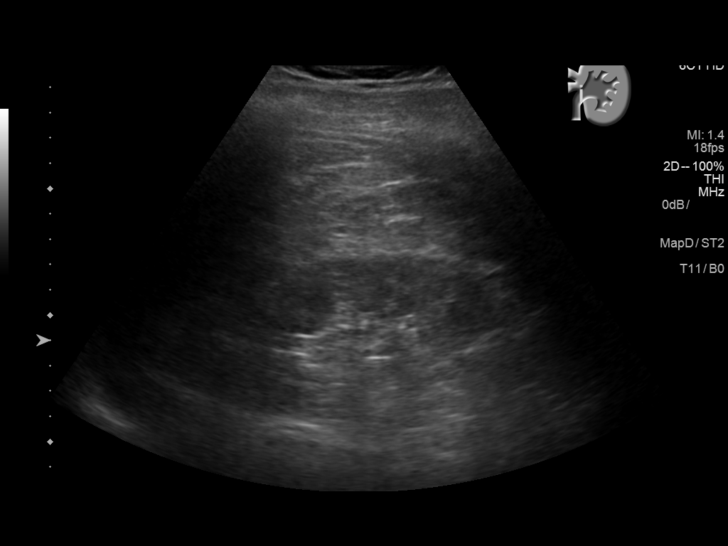
[im 31/50]
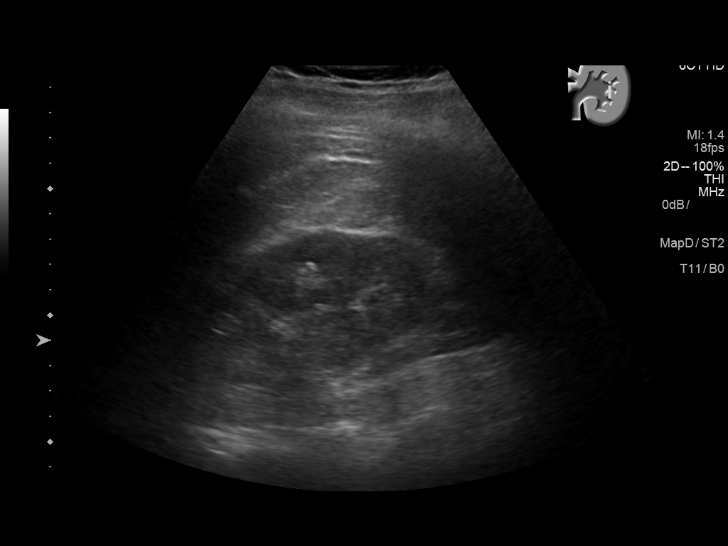
[im 33/50]
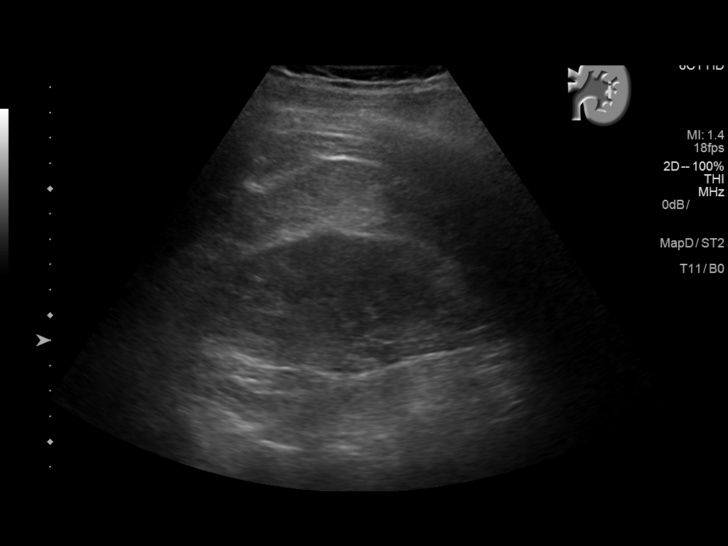
[im 37/50]
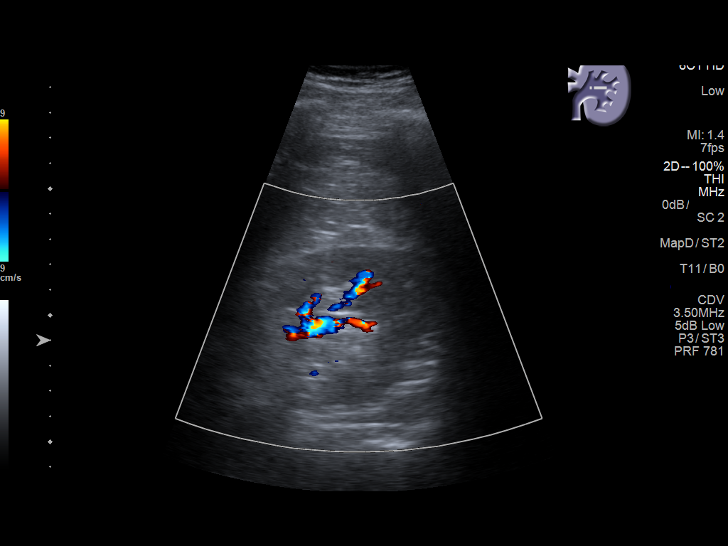
[im 41/50]
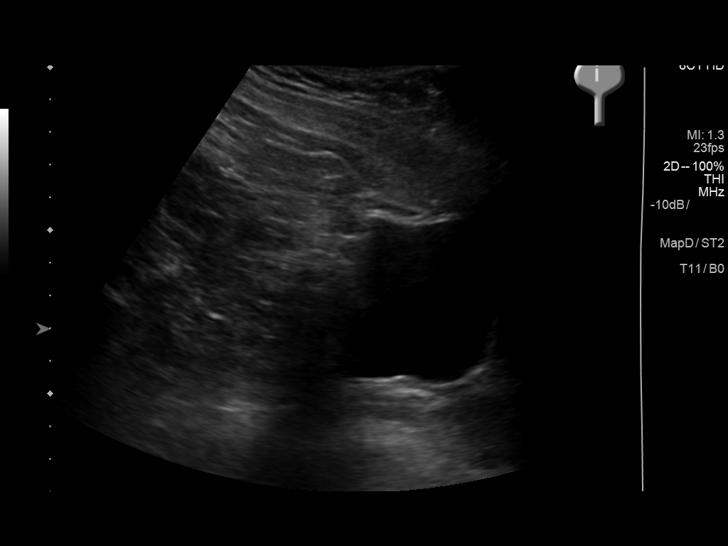
[im 45/50]
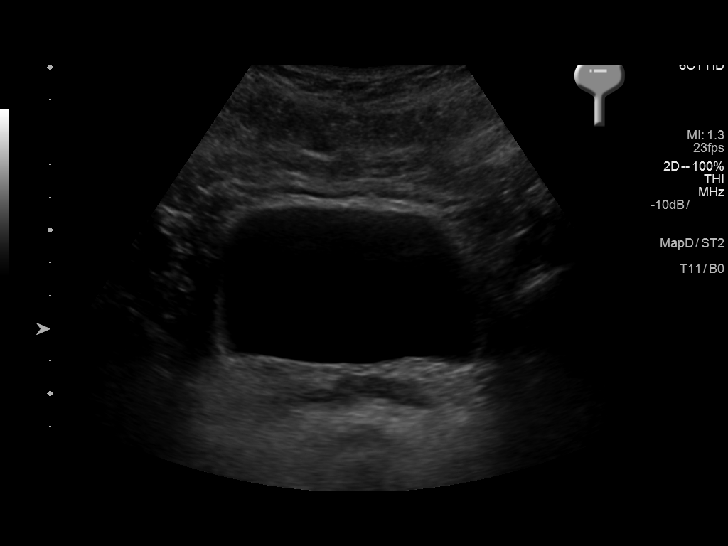
[im 50/50]
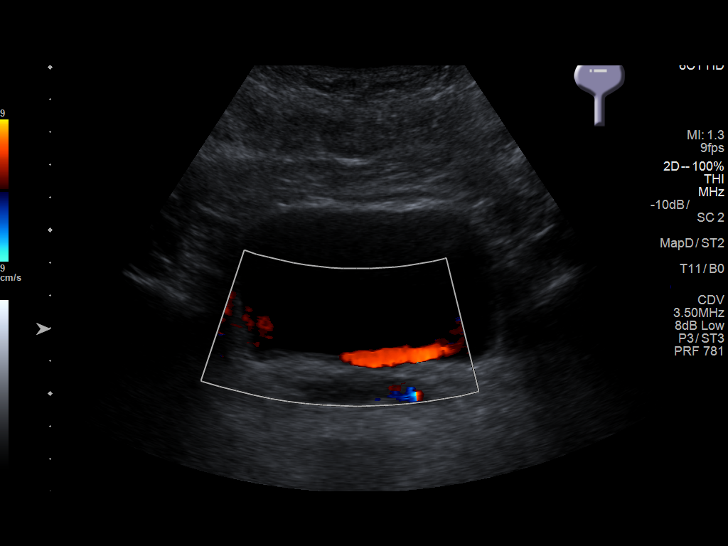

[14 of 25 positions shown; findings below may reference images not displayed]

FINDINGS: Right Kidney:

Length: 12.6 cm. Mild prominence of the renal pelvis is observed
without definite hydronephrosis. No renal stones are observed.

Left Kidney:

Length: 12.4 cm. Echogenicity within normal limits. No mass or
hydronephrosis visualized. No renal stones are observed.

Bladder:

Appears normal for degree of bladder distention. No urinary bladder
stones are observed. Bilateral ureteral jets were observed.
IMPRESSION: No evidence of hydronephrosis nor calcified stones within the
kidneys or urinary bladder.

## 2016-11-03 IMAGING — US US RENAL
1 series · 14 of 23 positions shown · non-contrast
Comparison: CT scan of the abdomen April 24, 2015 and renal
ultrasound May 03, 2015

CLINICAL DATA: Follow-up of right-sided hydronephrosis due to a
distal calculus; passed a stone 1 month ago

EXAM:
RENAL / URINARY TRACT ULTRASOUND COMPLETE

[Series 1: us renal · 0.26mm/px · 14 of 23 slices shown]
[im 1/23]
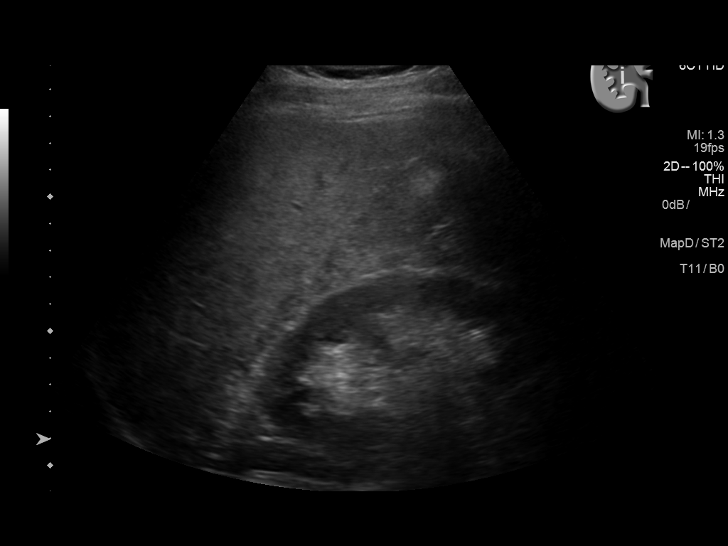
[im 3/23]
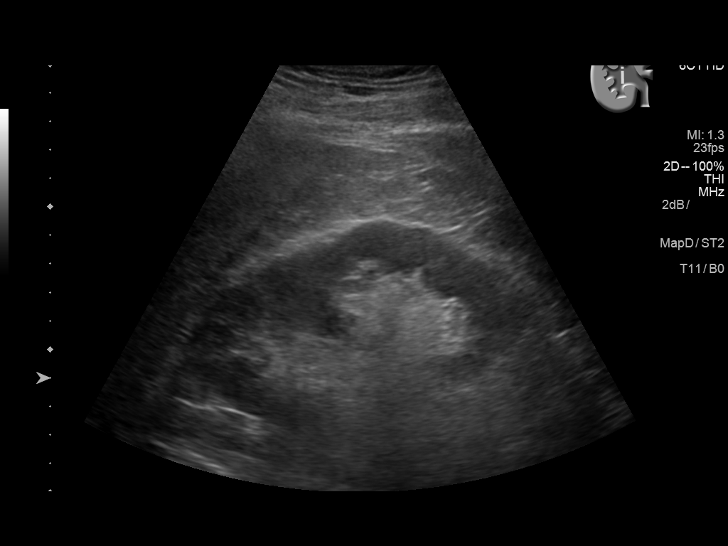
[im 5/23]
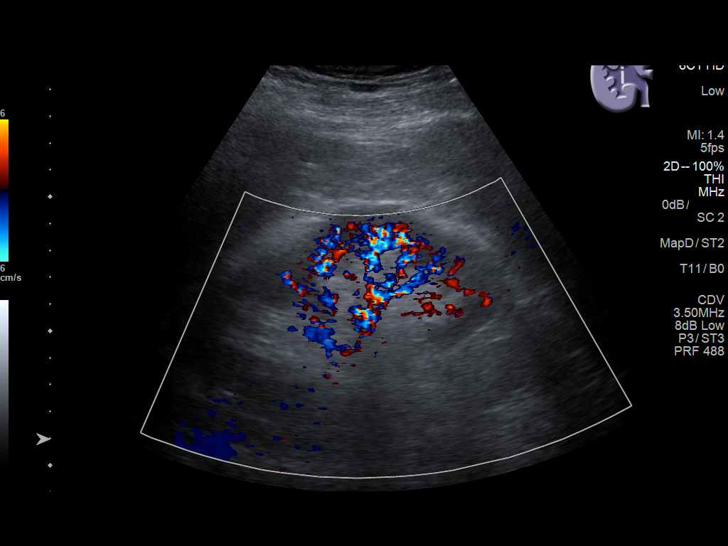
[im 6/23]
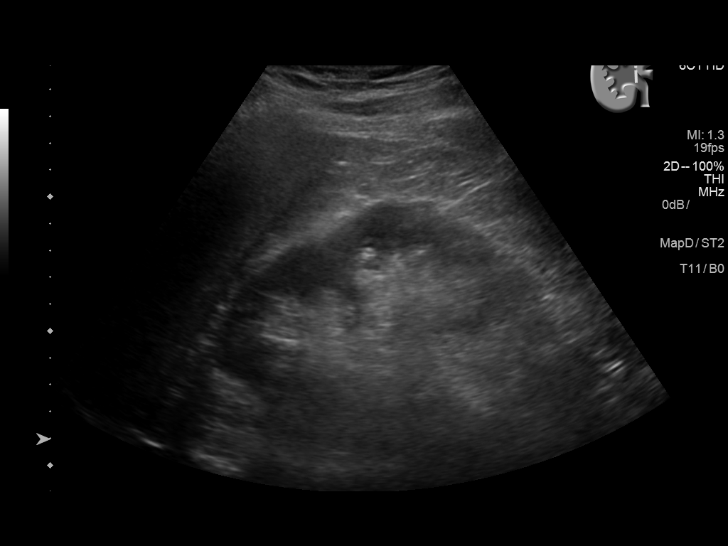
[im 8/23]
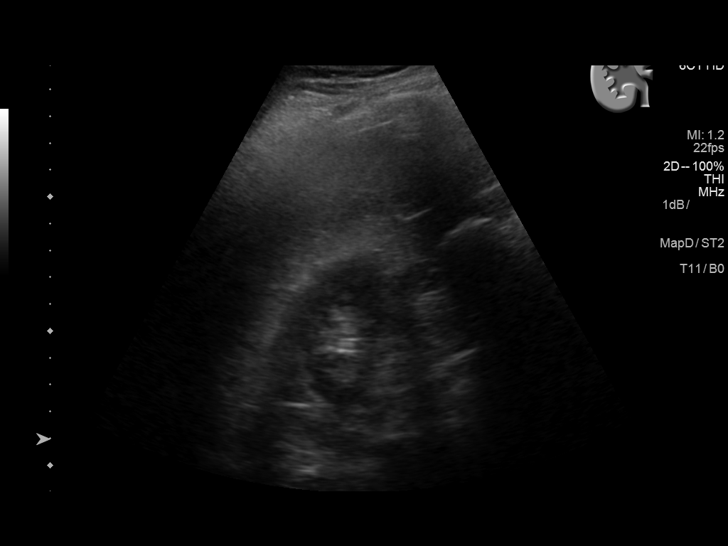
[im 10/23]
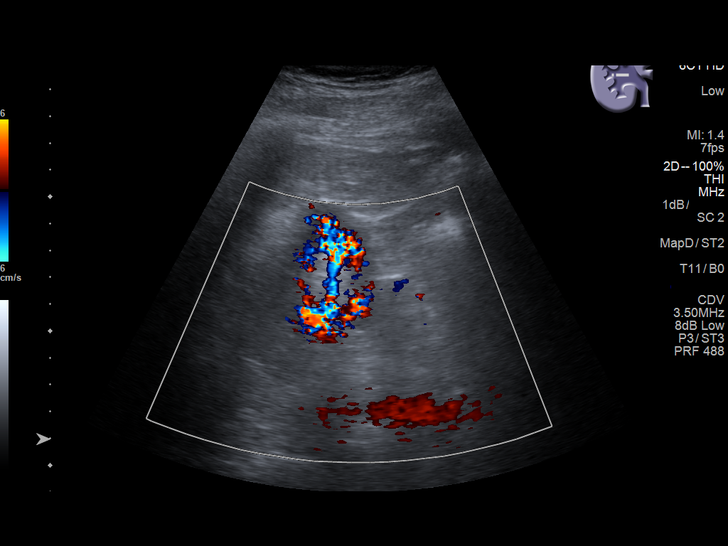
[im 11/23]
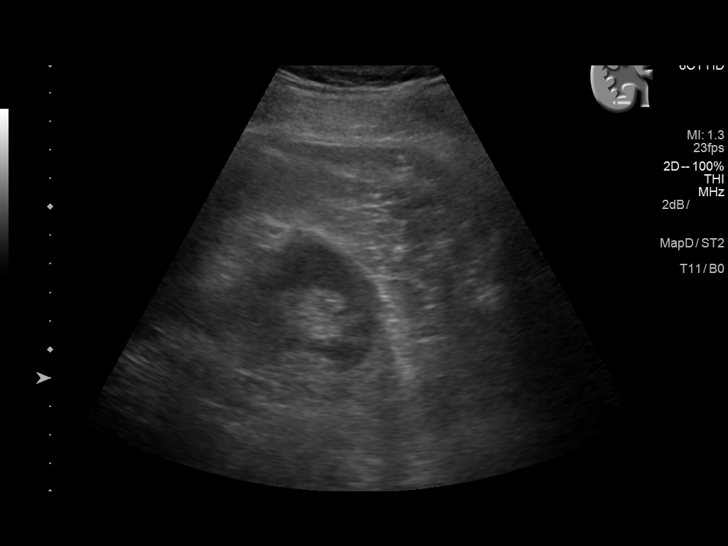
[im 13/23]
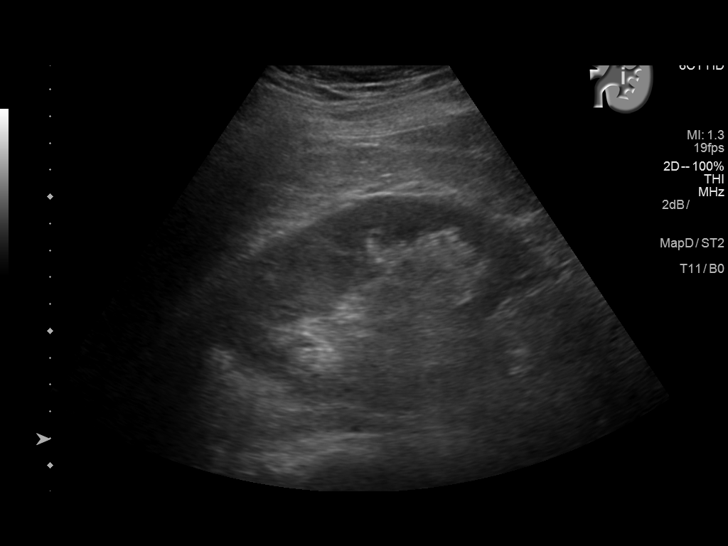
[im 14/23]
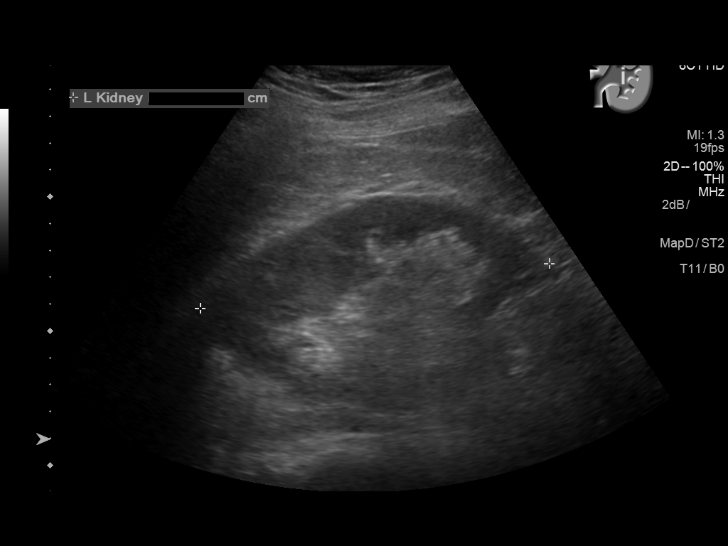
[im 16/23]
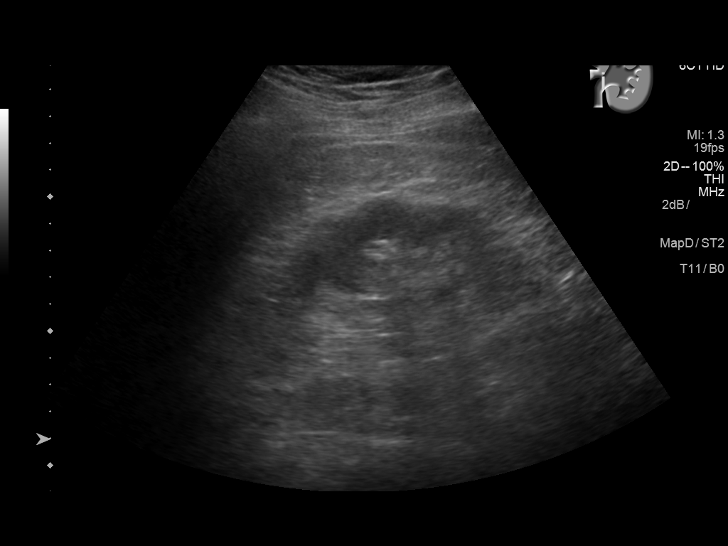
[im 18/23]
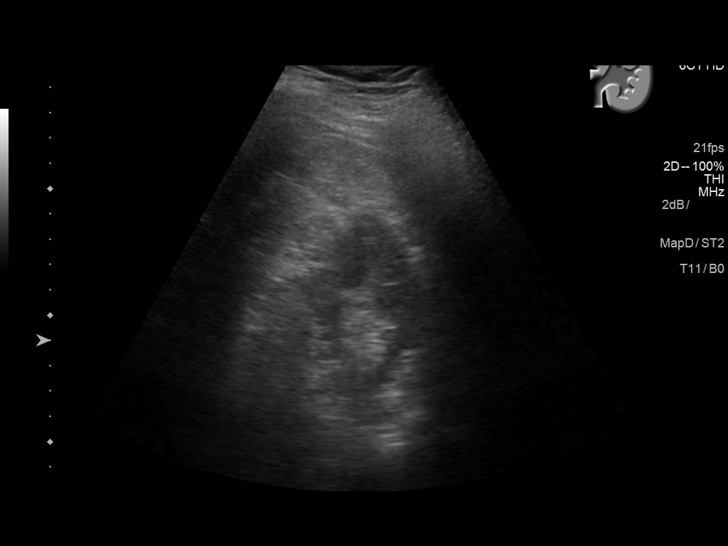
[im 19/23]
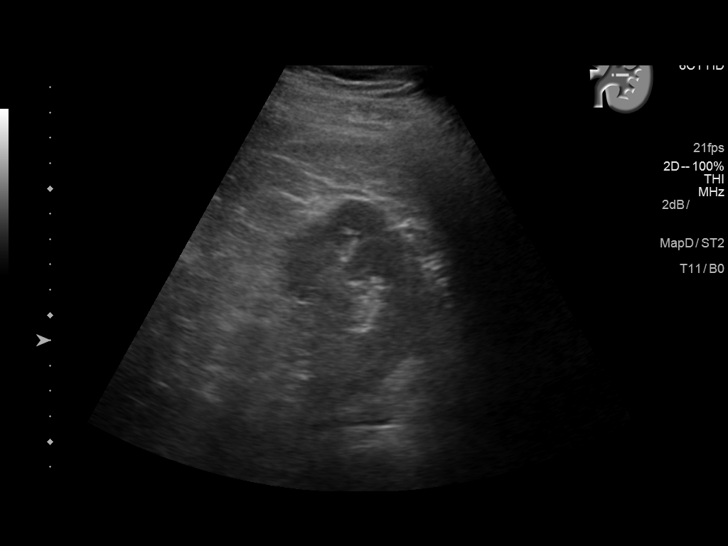
[im 21/23]
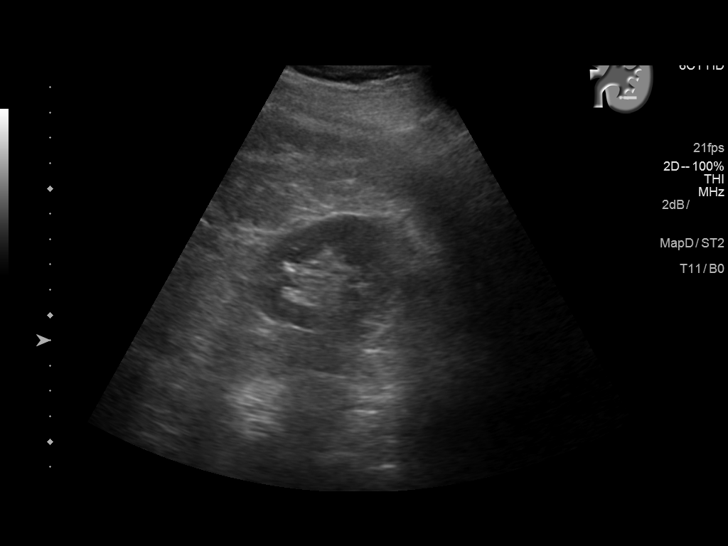
[im 23/23]
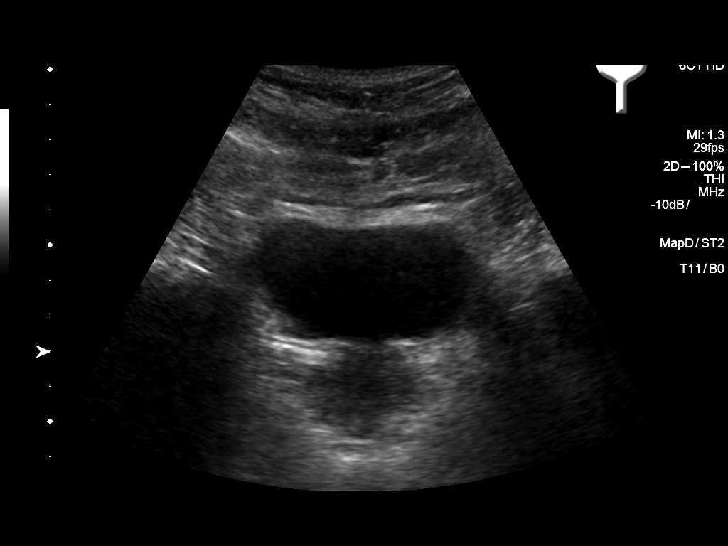

[14 of 23 positions shown; findings below may reference images not displayed]

FINDINGS: Right Kidney:

Length: 12.9 cm. Echogenicity within normal limits. No mass or
hydronephrosis visualized. No stones are demonstrated.

Left Kidney:

Length: 13.1 cm. Echogenicity within normal limits. No mass or
hydronephrosis visualized. No stones are demonstrated.

Bladder:

Appears normal for degree of bladder distention. No stones are
evident.
IMPRESSION: Normal ultrasound of the kidneys and urinary bladder. No obstructing
or nonobstructing stones are demonstrated.

## 2018-05-11 ENCOUNTER — Encounter: Payer: Self-pay | Admitting: Internal Medicine

## 2018-05-11 ENCOUNTER — Ambulatory Visit: Payer: Managed Care, Other (non HMO) | Admitting: Internal Medicine

## 2018-05-11 ENCOUNTER — Other Ambulatory Visit: Payer: Self-pay

## 2018-05-11 ENCOUNTER — Ambulatory Visit (INDEPENDENT_AMBULATORY_CARE_PROVIDER_SITE_OTHER)
Admission: RE | Admit: 2018-05-11 | Discharge: 2018-05-11 | Disposition: A | Discharge status: Home / Self Care | Payer: Managed Care, Other (non HMO) | Source: Ambulatory Visit | Attending: Internal Medicine | Admitting: Internal Medicine

## 2018-05-11 VITALS — BP 126/74 | HR 80 | Temp 98.1°F | Ht 71.5 in | Wt 210.0 lb

## 2018-05-11 DIAGNOSIS — M25562 Pain in left knee: Secondary | ICD-10-CM

## 2018-05-11 DIAGNOSIS — M25512 Pain in left shoulder: Secondary | ICD-10-CM | POA: Diagnosis not present

## 2018-05-11 DIAGNOSIS — F32A Depression, unspecified: Secondary | ICD-10-CM | POA: Insufficient documentation

## 2018-05-11 DIAGNOSIS — G8929 Other chronic pain: Secondary | ICD-10-CM

## 2018-05-11 DIAGNOSIS — F329 Major depressive disorder, single episode, unspecified: Secondary | ICD-10-CM | POA: Diagnosis not present

## 2018-05-11 DIAGNOSIS — F419 Anxiety disorder, unspecified: Secondary | ICD-10-CM | POA: Diagnosis not present

## 2018-05-11 MED ORDER — CLONAZEPAM 0.5 MG PO TABS: 0.5000 mg | ORAL_TABLET | Freq: Two times a day (BID) | ORAL | 0 refills | Status: DC

## 2018-05-11 NOTE — Assessment & Plan Note (Signed)
Clonazepam refilled today Continue Olanzapine-Fluoxetine and Nuvigil Referral placed to psychiatry

## 2018-05-11 NOTE — Patient Instructions (Signed)

## 2018-05-11 NOTE — Progress Notes (Signed)
HPI  Pt presents to the clinic today to establish care and for management of the conditions listed below. He has not had a PCP for many years.  History of kidney Stones: > 4 years ago. Currently not an issue.  Bipolar Depression: Managed on Olanzapine-Fluoxetine, Nuvigil and Clonazepam. He has been following with Dr. Cristopher Peru, who recently lost his [employer] license.  He also c/o left shoulder pain. This started 2 weeks ago. He describes the pain as sharp. The pain does not radiate. He denies numbness, tingling or weakness. He denies any recent trama to the area. He has not taken anything OTC for this.   He also c/o left knee pain. This has been an ongoing issue for 3-4 years. He describes the pain as achy, stiff. It is worse with squatting. He denies any injury to the area. He has not taken anything OTC for this.   Flu: never Tetanus: 2-3 years ago Vision Screening: as needed Dentist: biannually  Past Medical History:  Diagnosis Date  . Kidney stones   . Kidney stones     Current Outpatient Medications  Medication Sig Dispense Refill  . clonazePAM (KLONOPIN) 0.5 MG tablet Take 0.5 mg by mouth 2 (two) times daily.  3  . NUVIGIL 250 MG tablet Take 250 mg by mouth every morning.  1  . OLANZapine-FLUoxetine (SYMBYAX) 3-25 MG per capsule Take 1 capsule by mouth at bedtime.   3   No current facility-administered medications for this visit.     Allergies  Allergen Reactions  . Flomax [Tamsulosin Hcl] Rash    Family History  Problem Relation Age of Onset  . Kidney failure Father     Social History   Socioeconomic History  . Marital status: Married    Spouse name: Not on file  . Number of children: Not on file  . Years of education: Not on file  . Highest education level: Not on file  Occupational History  . Not on file  Social Needs  . Financial resource strain: Not on file  . Food insecurity:    Worry: Not on file    Inability: Not on file  . Transportation needs:   Medical: Not on file    Non-medical: Not on file  Tobacco Use  . Smoking status: Never Smoker  . Smokeless tobacco: Former Systems developer    Types: Chew  Substance and Sexual Activity  . Alcohol use: Yes    Alcohol/week: 4.0 standard drinks    Types: 4 Cans of beer per week    Comment: occassional  . Drug use: No  . Sexual activity: Not on file  Lifestyle  . Physical activity:    Days per week: Not on file    Minutes per session: Not on file  . Stress: Not on file  Relationships  . Social connections:    Talks on phone: Not on file    Gets together: Not on file    Attends religious service: Not on file    Active member of club or organization: Not on file    Attends meetings of clubs or organizations: Not on file    Relationship status: Not on file  . Intimate partner violence:    Fear of current or ex partner: Not on file    Emotionally abused: Not on file    Physically abused: Not on file    Forced sexual activity: Not on file  Other Topics Concern  . Not on file  Social History Narrative  .  Not on file    ROS:  Constitutional: Denies fever, malaise, fatigue, headache or abrupt weight changes.  HEENT: Denies eye pain, eye redness, ear pain, ringing in the ears, wax buildup, runny nose, nasal congestion, bloody nose, or sore throat. Respiratory: Denies difficulty breathing, shortness of breath, cough or sputum production.   Cardiovascular: Denies chest pain, chest tightness, palpitations or swelling in the hands or feet.  Gastrointestinal: Denies abdominal pain, bloating, constipation, diarrhea or blood in the stool.  GU: Denies frequency, urgency, pain with urination, blood in urine, odor or discharge. Musculoskeletal: Pt reports left shoulder pain, left knee pain. Denies decrease in range of motion, difficulty with gait, muscle pain or joint  swelling.  Skin: Denies redness, rashes, lesions or ulcercations.  Neurological: Denies dizziness, difficulty with memory, difficulty  with speech or problems with balance and coordination.  Psych: Pt has a history of anxiety and depression. Denies SI/HI.  No other specific complaints in a complete review of systems (except as listed in HPI above).  PE:  BP 126/74   Pulse 80   Temp 98.1 F (36.7 C) (Oral)   Ht 5' 11.5" (1.816 m)   Wt 210 lb (95.3 kg)   SpO2 98%   BMI 28.88 kg/m  Wt Readings from Last 3 Encounters:  05/11/18 210 lb (95.3 kg)  06/12/15 216 lb 12.8 oz (98.3 kg)  05/12/15 202 lb 6.4 oz (91.8 kg)    General: Appears his stated age, well developed, well nourished in NAD. Neck: Neck supple, trachea midline. No masses, lumps or thyromegaly present.  Cardiovascular: Normal rate and rhythm. S1,S2 noted.  No murmur, rubs or gallops noted.  Pulmonary/Chest: Normal effort and positive vesicular breath sounds. No respiratory distress. No wheezes, rales or ronchi noted.  Musculoskeletal: Normal internal and external ROM of the left shoulder. No pain with palpation of the shoulder. Strength 5/5 BUE/BLE. Normal flexion, extension of the left knee. No joint swelling noted. No pain with palpation of the left knee. No difficulty with gait. Neurological: Alert and oriented.  Psychiatric: Mood and affect normal. Behavior is normal. Judgment and thought content normal.     BMET    Component Value Date/Time   NA 143 05/05/2015 0938   K 4.8 05/05/2015 0938   CL 98 05/05/2015 0938   CO2 29 05/05/2015 0938   GLUCOSE 89 05/05/2015 0938   GLUCOSE 97 04/24/2015 1503   BUN 20 05/05/2015 0938   CREATININE 1.16 05/05/2015 0938   CALCIUM 9.7 05/05/2015 0938   GFRNONAA 78 05/05/2015 0938   GFRAA 91 05/05/2015 0938    Lipid Panel  No results found for: CHOL, TRIG, HDL, CHOLHDL, VLDL, LDLCALC  CBC    Component Value Date/Time   WBC 10.6 04/24/2015 1503   RBC 4.17 (L) 04/24/2015 1503   HGB 13.1 04/24/2015 1503   HCT 37.0 (L) 04/24/2015 1503   PLT 203 04/24/2015 1503   MCV 88.7 04/24/2015 1503   MCH 31.4  04/24/2015 1503   MCHC 35.4 04/24/2015 1503   RDW 12.9 04/24/2015 1503   LYMPHSABS 1.6 04/18/2015 1930   MONOABS 0.8 04/18/2015 1930   EOSABS 0.1 04/18/2015 1930   BASOSABS 0.0 04/18/2015 1930    Hgb A1C No results found for: HGBA1C   Assessment and Plan:  Acute Left Shoulder Pain:  Xray left shoulder today NSAID's, ice as needed for discomfort Follow up with Dr. Lorelei Pont if pain persist or worsens  Chronic Left Knee Pain:  Xray left knee today NSAID's, ice as needed  for discomfort Follow up with Dr. Lorelei Pont if pain persist or worsens  Make an appt for your annual exam [name], NP

## 2018-05-13 ENCOUNTER — Encounter: Payer: Self-pay | Admitting: Internal Medicine

## 2018-05-14 MED ORDER — ARMODAFINIL 250 MG PO TABS: 250.0000 mg | ORAL_TABLET | Freq: Every morning | ORAL | 0 refills | Status: DC

## 2018-05-14 MED ORDER — NUVIGIL 250 MG PO TABS: 250.0000 mg | ORAL_TABLET | Freq: Every morning | ORAL | 0 refills | Status: DC

## 2018-05-14 NOTE — Telephone Encounter (Signed)
It looks like the Nuvigal was sent in as DAW. Please send it back as I have taken off the DAW check mark. Hopefully that will work.

## 2018-05-14 NOTE — Telephone Encounter (Signed)
Pt called and left a message on triage line asking if this will be taken care of today.

## 2018-05-14 NOTE — Addendum Note (Signed)
Addended by: Pilar Grammes on: 05/14/2018 01:46 PM   Modules accepted: Orders

## 2018-05-17 ENCOUNTER — Telehealth: Payer: Managed Care, Other (non HMO) | Admitting: Family

## 2018-05-17 DIAGNOSIS — B9789 Other viral agents as the cause of diseases classified elsewhere: Secondary | ICD-10-CM

## 2018-05-17 DIAGNOSIS — J019 Acute sinusitis, unspecified: Principal | ICD-10-CM

## 2018-05-17 MED ORDER — FLUTICASONE PROPIONATE 50 MCG/ACT NA SUSP: 2.0000 | Freq: Every day | NASAL | 0 refills | Status: DC

## 2018-05-17 MED ORDER — BENZONATATE 100 MG PO CAPS: 100.0000 mg | ORAL_CAPSULE | Freq: Two times a day (BID) | ORAL | 0 refills | Status: DC | PRN

## 2018-05-17 NOTE — Progress Notes (Signed)

## 2018-05-19 ENCOUNTER — Encounter: Payer: Self-pay | Admitting: Internal Medicine

## 2018-05-20 ENCOUNTER — Encounter: Payer: Self-pay | Admitting: Internal Medicine

## 2018-05-21 ENCOUNTER — Other Ambulatory Visit: Payer: Self-pay

## 2018-05-21 ENCOUNTER — Encounter: Payer: Self-pay | Admitting: Internal Medicine

## 2018-05-21 ENCOUNTER — Ambulatory Visit (INDEPENDENT_AMBULATORY_CARE_PROVIDER_SITE_OTHER): Payer: Managed Care, Other (non HMO) | Admitting: Internal Medicine

## 2018-05-21 DIAGNOSIS — J019 Acute sinusitis, unspecified: Secondary | ICD-10-CM | POA: Diagnosis not present

## 2018-05-21 DIAGNOSIS — B9689 Other specified bacterial agents as the cause of diseases classified elsewhere: Secondary | ICD-10-CM

## 2018-05-21 MED ORDER — AMOXICILLIN-POT CLAVULANATE 875-125 MG PO TABS: 1.0000 | ORAL_TABLET | Freq: Two times a day (BID) | ORAL | 0 refills | Status: DC

## 2018-05-21 NOTE — Patient Instructions (Signed)

## 2018-05-21 NOTE — Progress Notes (Signed)
Virtual Visit via Telephone Note  I connected with Dale Davis on 05/21/18 at 10:45 AM EDT by telephone and verified that I am speaking with the correct person using two identifiers.   I discussed the limitations, risks, security and privacy concerns of performing an evaluation and management service by telephone and the availability of in person appointments. I also discussed with the patient that there may be a patient responsible charge related to this service. The patient expressed understanding and agreed to proceed.   History of Present Illness: Pt reports facial pressure, runny nose, ear fullness and cough. This started 1 week ago. He is blowing green mucous out of his nose. He denies ear drainage or decreased hearing. The cough is nonproductive. He denies headache, facial pain, nasal congestion, sore throat or shortness of Davis. He denies denies fever, chills or body aches. He has tried Tylenol Cold and Sinus with minimal relief. He did an Evisit 3/22, diagnosed with viral sinusitis. He was prescribed Tessalon and Flonase which he did not pick up because these usually don't work for him. He has not had sick contacts.    Observations/Objective: Alert and oriented x 3. Able to speak in complete sentences. He sounds congested but no obvious SOB.   Assessment and Plan:  Acute Bacterial Sinusitis:  RX for Augmentin BID x 10 days Start Zyrtec and Flonase OTC Get some rest and drink plenty of water Return precautions discussed    Follow Up Instructions:    I discussed the assessment and treatment plan with the patient. The patient was provided an opportunity to ask questions and all were answered. The patient agreed with the plan and demonstrated an understanding of the instructions.   The patient was advised to call back or seek an in-person evaluation if the symptoms worsen or if the condition fails to improve as anticipated.  I provided 5 minutes of non-face-to-face time  during this encounter.   [name], NP

## 2018-05-26 ENCOUNTER — Ambulatory Visit: Payer: Self-pay | Admitting: Psychiatry

## 2018-05-28 ENCOUNTER — Ambulatory Visit: Payer: Self-pay | Admitting: Psychiatry

## 2018-05-28 ENCOUNTER — Encounter: Payer: Self-pay | Admitting: Internal Medicine

## 2018-05-28 MED ORDER — OLANZAPINE-FLUOXETINE HCL 3-25 MG PO CAPS: 1.0000 | ORAL_CAPSULE | Freq: Every day | ORAL | 1 refills | Status: DC

## 2018-06-08 ENCOUNTER — Encounter: Payer: Self-pay | Admitting: Internal Medicine

## 2018-06-08 MED ORDER — CLONAZEPAM 0.5 MG PO TABS
0.5000 mg | ORAL_TABLET | Freq: Two times a day (BID) | ORAL | 0 refills | Status: DC
Start: 2018-06-08 — End: 2018-07-27

## 2018-06-08 MED ORDER — ARMODAFINIL 250 MG PO TABS: 250.0000 mg | ORAL_TABLET | Freq: Every morning | ORAL | 0 refills | Status: DC

## 2018-06-12 ENCOUNTER — Other Ambulatory Visit: Payer: Self-pay | Admitting: Family

## 2018-06-24 ENCOUNTER — Other Ambulatory Visit: Payer: Self-pay | Admitting: Internal Medicine

## 2018-06-26 ENCOUNTER — Encounter: Payer: Self-pay | Admitting: Psychiatry

## 2018-06-26 ENCOUNTER — Other Ambulatory Visit: Payer: Self-pay

## 2018-06-26 ENCOUNTER — Ambulatory Visit (INDEPENDENT_AMBULATORY_CARE_PROVIDER_SITE_OTHER): Payer: Managed Care, Other (non HMO) | Admitting: Psychiatry

## 2018-06-26 DIAGNOSIS — F3162 Bipolar disorder, current episode mixed, moderate: Secondary | ICD-10-CM

## 2018-06-26 DIAGNOSIS — G4726 Circadian rhythm sleep disorder, shift work type: Secondary | ICD-10-CM | POA: Diagnosis not present

## 2018-06-26 DIAGNOSIS — F1320 Sedative, hypnotic or anxiolytic dependence, uncomplicated: Secondary | ICD-10-CM | POA: Diagnosis not present

## 2018-06-26 MED ORDER — OLANZAPINE-FLUOXETINE HCL 3-25 MG PO CAPS: 1.0000 | ORAL_CAPSULE | Freq: Every day | ORAL | 1 refills | Status: DC

## 2018-06-26 MED ORDER — ARMODAFINIL 250 MG PO TABS: 250.0000 mg | ORAL_TABLET | Freq: Every morning | ORAL | 0 refills | Status: DC
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
## 2018-06-26 NOTE — Progress Notes (Signed)
Spoke with patient. Reviewed and updated medical history, surgical history and allergies. Medication and pharmacy reviewed and updated. No vital signs taken due to phone visit.

## 2018-07-09 ENCOUNTER — Other Ambulatory Visit: Payer: Self-pay | Admitting: Psychiatry

## 2018-07-10 LAB — LIPID PANEL W/O CHOL/HDL RATIO
Cholesterol, Total: 291 mg/dL — ABNORMAL HIGH (ref 100–199)
HDL: 47 mg/dL (ref >39)
LDL Calculated: 218 mg/dL — ABNORMAL HIGH (ref 0–99)
Triglycerides: 130 mg/dL (ref 0–149)
VLDL Cholesterol Cal: 26 mg/dL (ref 5–40)

## 2018-07-10 LAB — CBC WITH DIFFERENTIAL/PLATELET
Basophils Absolute: 0 10*3/uL (ref 0.0–0.2)
Basos: 1 %
EOS (ABSOLUTE): 0.1 10*3/uL (ref 0.0–0.4)
Eos: 2 %
Hematocrit: 41 % (ref 37.5–51.0)
Hemoglobin: 14 g/dL (ref 13.0–17.7)
Immature Grans (Abs): 0 10*3/uL (ref 0.0–0.1)
Immature Granulocytes: 0 %
Lymphocytes Absolute: 0.8 10*3/uL (ref 0.7–3.1)
Lymphs: 25 %
MCH: 31 pg (ref 26.6–33.0)
MCHC: 34.1 g/dL (ref 31.5–35.7)
MCV: 91 fL (ref 79–97)
Monocytes Absolute: 0.2 10*3/uL (ref 0.1–0.9)
Monocytes: 7 %
Neutrophils Absolute: 2.1 10*3/uL (ref 1.4–7.0)
Neutrophils: 65 %
Platelets: 174 10*3/uL (ref 150–450)
RBC: 4.52 x10E6/uL (ref 4.14–5.80)
RDW: 13.2 % (ref 11.6–15.4)
WBC: 3.3 10*3/uL — ABNORMAL LOW (ref 3.4–10.8)

## 2018-07-10 LAB — COMPREHENSIVE METABOLIC PANEL
ALT: 29 IU/L (ref 0–44)
AST: 20 IU/L (ref 0–40)
Albumin/Globulin Ratio: 2.1 (ref 1.2–2.2)
Albumin: 4.6 g/dL (ref 4.0–5.0)
Alkaline Phosphatase: 70 IU/L (ref 39–117)
BUN/Creatinine Ratio: 15 (ref 9–20)
BUN: 17 mg/dL (ref 6–24)
Bilirubin Total: 0.4 mg/dL (ref 0.0–1.2)
CO2: 25 mmol/L (ref 20–29)
Calcium: 9.9 mg/dL (ref 8.7–10.2)
Chloride: 102 mmol/L (ref 96–106)
Creatinine, Ser: 1.15 mg/dL (ref 0.76–1.27)
GFR calc Af Amer: 89 mL/min/{1.73_m2} (ref >59)
GFR calc non Af Amer: 77 mL/min/{1.73_m2} (ref >59)
Globulin, Total: 2.2 g/dL (ref 1.5–4.5)
Glucose: 98 mg/dL (ref 65–99)
Potassium: 5 mmol/L (ref 3.5–5.2)
Sodium: 141 mmol/L (ref 134–144)
Total Protein: 6.8 g/dL (ref 6.0–8.5)

## 2018-07-10 LAB — PROLACTIN: Prolactin: 13.7 ng/mL (ref 4.0–15.2)

## 2018-07-10 LAB — TSH: TSH: 1.83 u[IU]/mL (ref 0.450–4.500)

## 2018-07-10 LAB — HGB A1C W/O EAG: Hgb A1c MFr Bld: 5.3 % (ref 4.8–5.6)

## 2018-07-27 ENCOUNTER — Ambulatory Visit (INDEPENDENT_AMBULATORY_CARE_PROVIDER_SITE_OTHER): Payer: 59 | Admitting: Psychiatry

## 2018-07-27 ENCOUNTER — Other Ambulatory Visit: Payer: Self-pay

## 2018-07-27 ENCOUNTER — Encounter: Payer: Self-pay | Admitting: Psychiatry

## 2018-07-27 DIAGNOSIS — G4726 Circadian rhythm sleep disorder, shift work type: Secondary | ICD-10-CM

## 2018-07-27 DIAGNOSIS — F3162 Bipolar disorder, current episode mixed, moderate: Secondary | ICD-10-CM | POA: Diagnosis not present

## 2018-07-27 DIAGNOSIS — F1320 Sedative, hypnotic or anxiolytic dependence, uncomplicated: Secondary | ICD-10-CM

## 2018-07-27 MED ORDER — OLANZAPINE 7.5 MG PO TABS: 7.5000 mg | ORAL_TABLET | Freq: Every day | ORAL | 0 refills | Status: DC

## 2018-07-27 MED ORDER — FLUOXETINE HCL 20 MG PO CAPS: 20.0000 mg | ORAL_CAPSULE | Freq: Every day | ORAL | 1 refills | Status: DC

## 2018-07-27 MED ORDER — ARMODAFINIL 250 MG PO TABS: 250.0000 mg | ORAL_TABLET | Freq: Every morning | ORAL | 0 refills | Status: DC

## 2018-07-27 NOTE — Progress Notes (Signed)
Virtual Visit via Video Note  I connected with Dale Davis on 07/27/18 at 10:30 AM EDT by a video enabled telemedicine application and verified that I am speaking with the correct person using two identifiers.   I discussed the limitations of evaluation and management by telemedicine and the availability of in person appointments. The patient expressed understanding and agreed to proceed.    I discussed the assessment and treatment plan with the patient. The patient was provided an opportunity to ask questions and all were answered. The patient agreed with the plan and demonstrated an understanding of the instructions.   The patient was advised to call back or seek an in-person evaluation if the symptoms worsen or if the condition fails to improve as anticipated.  [facility] MD OP Progress Note  07/27/2018 11:30 AM ASAH LAMAY  MRN:  [id number]  Chief Complaint:  Chief Complaint    Follow-up     HPI: Dale Davis is a 43 year old Caucasian male, married, employed, lives in [place], has a history of bipolar disorder, shiftwork disorder, benzodiazepine dependence was evaluated by telemedicine today.  Patient reports mood wise he is doing well on the current medication regimen.  He however continues to struggle with racing thoughts and sleep problems.  He was previously taking Klonopin which he reports was for anxiety as well as sleep.  He reports since it was discussed with him last visit to taper it off he completely stopped taking it.  He denies any withdrawal symptoms.  He reports however his sleep has gotten worse since then.  Patient reports since he is off of the Klonopin he has stopped taking the higher dosage of Nuvigil.  Nuvigil was added by his previous psychiatrist to treat shiftwork disorder and his grogginess and tiredness during the day.  Since being off of the Klonopin he does not feel too tired and he reports the half dosage of Nuvigil is helpful.  He wants to stay on that at this  time.  Patient continues to be compliant with Symbyax.  Patient denies any side effects.  Patient denies any suicidality, homicidality or perceptual disturbances.  His labs reported dated 07/09/2018 and discussed the results with him.  Discussed with him that we will send a message to his primary medical doctor and he needs to follow-up with his PMD for management of his abnormal lab results.  He agrees with plan. Visit Diagnosis:    ICD-10-CM   1. Bipolar 1 disorder, mixed, moderate (HCC) F31.62 FLUoxetine (PROZAC) 20 MG capsule    OLANZapine (ZYPREXA) 7.5 MG tablet  2. 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 Benzodiazepine dependence ([place]) F13.20     Past Psychiatric History: Reviewed past psychiatric history from my progress note on 06/26/2018.  Past Medical History:  Past Medical History:  Diagnosis Date  . Anxiety   . Depression   . Kidney stones   . Kidney stones     Past Surgical History:  Procedure Laterality Date  . 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 Kidney failure Father     Social History: Reviewed social history from my progress note on 06/26/2018. Social History   Socioeconomic History  . Marital status: Married    Spouse name: [name]  . Number of children: 0  . Years of education: Not  on file  . Highest education level: Not on file  Occupational History  . Occupation: Medical sales representative: [employer]  Social Needs  . Financial resource strain: Not hard at all  . Food insecurity:    Worry: Never true    Inability: Never true  . Transportation needs:    Medical: No    Non-medical: No  Tobacco Use  . Smoking status: Never Smoker  . Smokeless tobacco: Current User    Types: Chew  Substance and Sexual Activity  . Alcohol  use: Yes    Alcohol/week: 1.0 standard drinks    Types: 1 Standard drinks or equivalent per week    Comment: occassional  . Drug use: No  . Sexual activity: Yes  Lifestyle  . Physical activity:    Days per week: 7 days    Minutes per session: 20 min  . Stress: Only a little  Relationships  . Social connections:    Talks on phone: Twice a week    Gets together: Twice a week    Attends religious service: Never    Active member of club or organization: No    Attends meetings of clubs or organizations: Never    Relationship status: Married  Other Topics Concern  . Not on file  Social History Narrative  . Not on file    Allergies:  Allergies  Allergen Reactions  . Flomax [Tamsulosin Hcl] Rash    Metabolic Disorder Labs: Lab Results  Component Value Date   HGBA1C 5.3 07/09/2018   Lab Results  Component Value Date   PROLACTIN 13.7 07/09/2018   Lab Results  Component Value Date   CHOL 291 (H) 07/09/2018   TRIG 130 07/09/2018   HDL 47 07/09/2018   LDLCALC 218 (H) 07/09/2018   Lab Results  Component Value Date   TSH 1.830 07/09/2018    Therapeutic Level Labs: No results found for: LITHIUM No results found for: VALPROATE No components found for:  CBMZ  Current Medications: Current Outpatient Medications  Medication Sig Dispense Refill  . amoxicillin-clavulanate (AUGMENTIN) 875-125 MG tablet Take 1 tablet by mouth 2 (two) times daily. (Patient not taking: Reported on 06/26/2018) 20 tablet 0  . Armodafinil (NUVIGIL) 250 MG tablet Take 1 tablet (250 mg total) by mouth every morning. Patient has supplies 30 tablet 0  . benzonatate (TESSALON) 100 MG capsule Take 1 capsule (100 mg total) by mouth 2 (two) times daily as needed for cough. (Patient not taking: Reported on 05/21/2018) 20 capsule 0  . FLUoxetine (PROZAC) 20 MG capsule Take 1 capsule (20 mg total) by mouth daily with breakfast. 30 capsule 1  . fluticasone (FLONASE) 50 MCG/ACT nasal spray Place 2 sprays into both  nostrils daily. (Patient not taking: Reported on 05/21/2018) 16 g 0  . OLANZapine (ZYPREXA) 7.5 MG tablet Take 1 tablet (7.5 mg total) by mouth at bedtime. For mood and sleep 30 tablet 0   No current facility-administered medications for this visit.      Musculoskeletal: Strength & Muscle Tone: within normal limits Gait & Station: observed as sitting Patient leans: N/A  Psychiatric Specialty Exam: Review of Systems  Psychiatric/Behavioral: The patient is nervous/anxious and has insomnia.   All other systems reviewed and are negative.   There were no vitals taken for this visit.There is no height or weight on file to calculate BMI.  General Appearance: Casual  Eye Contact:  Fair  Speech:  Clear and Coherent  Volume:  Normal  Mood:  Anxious  Affect:  Congruent  Thought Process:  Goal Directed and Descriptions of Associations: Intact  Orientation:  Full (Time, Place, and Person)  Thought Content: Logical   Suicidal Thoughts:  No  Homicidal Thoughts:  No  Memory:  Immediate;   Fair Recent;   Fair Remote;   Fair  Judgement:  Fair  Insight:  Fair  Psychomotor Activity:  Normal  Concentration:  Concentration: Fair and Attention Span: Fair  Recall:  [employer] of Knowledge: Fair  Language: Fair  Akathisia:  No  Handed:  Right  AIMS (if indicated): Denies tremors, rigidity, stiffness  Assets:  Communication Skills Desire for Improvement Social Support  ADL's:  Intact  Cognition: WNL  Sleep:  Poor   Screenings:   Assessment and Plan: Dale Davis is a 43 year old Caucasian male, married, employed, lives in [place], has a history of bipolar disorder, shiftwork disorder was evaluated by telemedicine today.  Patient is biologically predisposed given his history of mental health problems in his family.  He also has current situational stressors of COVID-19 outbreak.  Patient has stopped taking his benzodiazepines and is currently struggling with sleep problems.  We will continue to  readjust medications.  Plan as noted below.  Plan Bipolar disorder- unstable Discontinue Symbyax. Start fluoxetine 20 mg p.o. daily with breakfast Increase olanzapine to 7.5 mg p.o. nightly   For shiftwork disorder-stable He is currently taking half a tablet of Nuvigil 250 mg in the morning.  We will continue the same.  Benzodiazepine dependence- in early remission Patient reports he stopped taking the Klonopin and this doing well, denies any withdrawal symptoms.  Reviewed the following labs resulted on 07/09/2018, discussed with patient. Prolactin-within normal limits, TSH- 1.830-within normal limits, hemoglobin A1c-5.3-within normal limits, lipid panel- cholesterol elevated at 291, CMP-within normal limits, CBC with differential- WBC count low at 3.3. We will sent a message to his primary medical provider-Ms. [employer].   Follow-up in clinic in 2 to 3 weeks or sooner if needed.  Appointment scheduled for June 19 at 11:15 in the morning.  I have spent atleast 25 minutes non face to face with patient today. More than 50 % of the time was spent for psychoeducation and supportive psychotherapy and care coordination.  This note was generated in part or whole with voice recognition software. Voice recognition is usually quite accurate but there are transcription errors that can and very often do occur. I apologize for any typographical errors that were not detected and corrected.       Ursula Alert, MD 07/27/2018, 11:30 AM

## 2018-08-14 ENCOUNTER — Other Ambulatory Visit: Payer: Self-pay

## 2018-08-14 ENCOUNTER — Ambulatory Visit (INDEPENDENT_AMBULATORY_CARE_PROVIDER_SITE_OTHER): Payer: 59 | Admitting: Psychiatry

## 2018-08-14 ENCOUNTER — Encounter: Payer: Self-pay | Admitting: Psychiatry

## 2018-08-14 DIAGNOSIS — F1321 Sedative, hypnotic or anxiolytic dependence, in remission: Secondary | ICD-10-CM | POA: Insufficient documentation

## 2018-08-14 DIAGNOSIS — F1320 Sedative, hypnotic or anxiolytic dependence, uncomplicated: Secondary | ICD-10-CM | POA: Diagnosis not present

## 2018-08-14 DIAGNOSIS — F3162 Bipolar disorder, current episode mixed, moderate: Secondary | ICD-10-CM | POA: Diagnosis not present

## 2018-08-14 DIAGNOSIS — G4726 Circadian rhythm sleep disorder, shift work type: Secondary | ICD-10-CM | POA: Insufficient documentation

## 2018-08-14 MED ORDER — TRAZODONE HCL 50 MG PO TABS: 25.0000 mg | ORAL_TABLET | Freq: Every day | ORAL | 1 refills | Status: DC

## 2018-08-14 MED ORDER — OLANZAPINE-FLUOXETINE HCL 3-25 MG PO CAPS: 1.0000 | ORAL_CAPSULE | Freq: Every evening | ORAL | 0 refills | Status: DC

## 2018-08-14 NOTE — Progress Notes (Signed)
Virtual Visit via Video Note  I connected with Dale Davis on 08/14/18 at 11:15 AM EDT by a video enabled telemedicine application and verified that I am speaking with the correct person using two identifiers.   I discussed the limitations of evaluation and management by telemedicine and the availability of in person appointments. The patient expressed understanding and agreed to proceed.     I discussed the assessment and treatment plan with the patient. The patient was provided an opportunity to ask questions and all were answered. The patient agreed with the plan and demonstrated an understanding of the instructions.   The patient was advised to call back or seek an in-person evaluation if the symptoms worsen or if the condition fails to improve as anticipated.   [facility] MD OP Progress Note  08/14/2018 1:19 PM Dale Davis  MRN:  [id number]  Chief Complaint:  Chief Complaint    Follow-up     HPI: Dale Davis is a 43 year old Caucasian male, married, employed, lives in [place], has a history of bipolar disorder, shiftwork disorder, benzodiazepine dependence was evaluated by telemedicine today.  Patient reports he struggled with some restlessness and sleep problems at night after the medications were readjusted last visit.  His Symbyax was changed to Prozac and Zyprexa and his Klonopin was discontinued.  Patient reports he felt restless at night and could not sleep and hence he started taking Prozac and Zyprexa together at bedtime.  He also had some fatigue during the day when he tried taking Prozac in the morning.  He reports taking the Prozac at night help with the fatigue during the day however he continues to struggle with sleep and restless leg symptoms.  He has started taking his Symbyax which helped with his restless leg symptoms.  His restless leg symptoms resolved however he continued to struggle with sleep.  He has started taking a half dosage of Klonopin every night to sleep and that  has been helpful.  Patient reports he continues to take the Nuvigil during the day.  Patient denies any other concerns today.  He reports work is going well.  He continues to stay safe from COVID-19.  Patient denies any suicidality, homicidality or perceptual disturbances.   Visit Diagnosis:    ICD-10-CM   1. Bipolar 1 disorder, mixed, moderate (HCC)  F31.62 OLANZapine-FLUoxetine (SYMBYAX) 3-25 MG capsule    traZODone (DESYREL) 50 MG tablet  2. 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 Benzodiazepine dependence ([place])  F13.20     Past Psychiatric History: I have reviewed past psychiatric history from my progress note on 06/26/2018.  Past Medical History:  Past Medical History:  Diagnosis Date  . Anxiety   . Depression   . Kidney stones   . Kidney stones     Past Surgical History:  Procedure Laterality Date  . 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  Family History:  Family History  Problem Relation Age of Onset  . Kidney failure Father     Social History: I have reviewed social history from my progress note on 06/26/2018. Social History   Socioeconomic History  . Marital status: Married    Spouse name: Dale Davis  . Number of children: 0  . Years of education: Not on file  . Highest education level: Not on file  Occupational History  . 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 Financial resource strain: Not hard at all  . Food insecurity    Worry: Never true    Inability: Never true  . Transportation needs    Medical: No    Non-medical: No  Tobacco Use  . Smoking status: Never Smoker  . Smokeless tobacco: Current User    Types: Chew  Substance and Sexual Activity  . Alcohol use: Yes    Alcohol/week: 1.0 standard drinks    Types: 1 Standard drinks or equivalent per week    Comment: occassional  . Drug use:  No  . Sexual activity: Yes  Lifestyle  . Physical activity    Days per week: 7 days    Minutes per session: 20 min  . Stress: Only a little  Relationships  . Social Herbalist on phone: Twice a week    Gets together: Twice a week    Attends religious service: Never    Active member of club or organization: No    Attends meetings of clubs or organizations: Never    Relationship status: Married  Other Topics Concern  . Not on file  Social History Narrative  . Not on file    Allergies:  Allergies  Allergen Reactions  . Flomax [Tamsulosin Hcl] Rash    Metabolic Disorder Labs: Lab Results  Component Value Date   HGBA1C 5.3 07/09/2018   Lab Results  Component Value Date   PROLACTIN 13.7 07/09/2018   Lab Results  Component Value Date   CHOL 291 (H) 07/09/2018   TRIG 130 07/09/2018   HDL 47 07/09/2018   LDLCALC 218 (H) 07/09/2018   Lab Results  Component Value Date   TSH 1.830 07/09/2018    Therapeutic Level Labs: No results found for: LITHIUM No results found for: VALPROATE No components found for:  CBMZ  Current Medications: Current Outpatient Medications  Medication Sig Dispense Refill  . amoxicillin-clavulanate (AUGMENTIN) 875-125 MG tablet Take 1 tablet by mouth 2 (two) times daily. (Patient not taking: Reported on 06/26/2018) 20 tablet 0  . Armodafinil (NUVIGIL) 250 MG tablet Take 1 tablet (250 mg total) by mouth every morning. Patient has supplies 30 tablet 0  . benzonatate (TESSALON) 100 MG capsule Take 1 capsule (100 mg total) by mouth 2 (two) times daily as needed for cough. (Patient not taking: Reported on 05/21/2018) 20 capsule 0  . fluticasone (FLONASE) 50 MCG/ACT nasal spray Place 2 sprays into both nostrils daily. (Patient not taking: Reported on 05/21/2018) 16 g 0  . OLANZapine-FLUoxetine (SYMBYAX) 3-25 MG capsule Take 1 capsule by mouth every evening. 90 capsule 0  . traZODone (DESYREL) 50 MG tablet Take 0.5-1 tablets (25-50 mg total) by  mouth at bedtime. Sleep 30 tablet 1   No current facility-administered medications for this visit.      Musculoskeletal: Strength & Muscle Tone: within normal limits Gait & Station: normal Patient leans: N/A  Psychiatric Specialty Exam: Review of Systems  Psychiatric/Behavioral: The patient has insomnia.   All other systems reviewed and are negative.   There were no vitals taken for this visit.There is no height or weight on file to calculate BMI.  General Appearance: Casual  Eye Contact:  Fair  Speech:  Clear and Coherent  Volume:  Normal  Mood:  Euthymic  Affect:  Congruent  Thought Process:  Goal Directed and Descriptions of Associations: Intact  Orientation:  Full (Time, Place, and Person)  Thought Content: Logical   Suicidal Thoughts:  No  Homicidal Thoughts:  No  Memory:  Immediate;   Fair Recent;   Fair Remote;   Fair  Judgement:  Fair  Insight:  Fair  Psychomotor Activity:  Normal  Concentration:  Concentration: Fair and Attention Span: Fair  Recall:  [employer] of Knowledge: Fair  Language: Fair  Akathisia:  No  Handed:  Right  AIMS (if indicated): Denies tremors, rigidity, stiffness  Assets:  Communication Skills Desire for Improvement Social Support  ADL's:  Intact  Cognition: WNL  Sleep:  Poor   Screenings:   Assessment and Plan: Dale Davis is a 43 year old Caucasian male, married, employed, lives in [place], has a history of bipolar disorder, shiftwork use disorder was evaluated by telemedicine today.  Patient is biologically predisposed given his history of mental health problems in the family.  He also has current stressors of COVID-19 outbreak.  Patient continues to struggle with sleep problems.  Plan as noted below.  Plan Bipolar disorder- improving Symbyax 3- 25 mg p.o. daily Start trazodone 25 to 50 mg p.o. nightly as needed for sleep. Discussed with patient to stop taking clonazepam and also provided education about not readjusting medications on  his own and to consult with his providers including writer prior to making changes.  For shiftwork disorder-stable Continue half tablet of Nuvigil 250 mg in the morning.  Since he started taking the clonazepam he reports he went up on the Nuvigil 2 1 whole tablet.  For benzodiazepine dependence-in early remission Discussed continue to stay away from [place].  Follow-up in clinic in 1 month or sooner if needed.  August 17 at 10:30 AM  I have spent atleast 15 minutes non face to face with patient today. More than 50 % of the time was spent for psychoeducation and supportive psychotherapy and care coordination.  This note was generated in part or whole with voice recognition software. Voice recognition is usually quite accurate but there are transcription errors that can and very often do occur. I apologize for any typographical errors that were not detected and corrected.         Ursula Alert, MD 08/14/2018, 1:19 PM

## 2018-08-23 ENCOUNTER — Other Ambulatory Visit: Payer: Self-pay | Admitting: Psychiatry

## 2018-08-23 DIAGNOSIS — F3162 Bipolar disorder, current episode mixed, moderate: Secondary | ICD-10-CM

## 2018-08-26 ENCOUNTER — Telehealth: Payer: Self-pay | Admitting: Psychiatry

## 2018-08-26 ENCOUNTER — Telehealth: Payer: Self-pay

## 2018-08-26 DIAGNOSIS — F3162 Bipolar disorder, current episode mixed, moderate: Secondary | ICD-10-CM

## 2018-08-26 DIAGNOSIS — G4726 Circadian rhythm sleep disorder, shift work type: Secondary | ICD-10-CM

## 2018-08-26 MED ORDER — OLANZAPINE-FLUOXETINE HCL 3-25 MG PO CAPS: 1.0000 | ORAL_CAPSULE | Freq: Every evening | ORAL | 0 refills | Status: DC

## 2018-08-26 MED ORDER — TRAZODONE HCL 50 MG PO TABS: 25.0000 mg | ORAL_TABLET | Freq: Every day | ORAL | 1 refills | Status: DC

## 2018-08-26 MED ORDER — ARMODAFINIL 250 MG PO TABS: 250.0000 mg | ORAL_TABLET | Freq: Every morning | ORAL | 0 refills | Status: DC

## 2018-08-26 NOTE — Telephone Encounter (Signed)
pt called left a message that the pharmacy did not received the rxs' please resend.

## 2018-08-26 NOTE — Telephone Encounter (Signed)
Sent meds to pharmacy - symbyax, trazodone, nuvigil.

## 2018-09-01 ENCOUNTER — Encounter: Payer: Self-pay | Admitting: Internal Medicine

## 2018-09-07 ENCOUNTER — Telehealth: Payer: Self-pay | Admitting: *Deleted

## 2018-09-07 ENCOUNTER — Encounter: Payer: Self-pay | Admitting: Internal Medicine

## 2018-09-07 ENCOUNTER — Ambulatory Visit (INDEPENDENT_AMBULATORY_CARE_PROVIDER_SITE_OTHER): Payer: Managed Care, Other (non HMO) | Admitting: Internal Medicine

## 2018-09-07 DIAGNOSIS — R110 Nausea: Secondary | ICD-10-CM | POA: Diagnosis not present

## 2018-09-07 DIAGNOSIS — R5383 Other fatigue: Secondary | ICD-10-CM

## 2018-09-07 DIAGNOSIS — R197 Diarrhea, unspecified: Secondary | ICD-10-CM

## 2018-09-07 DIAGNOSIS — Z20828 Contact with and (suspected) exposure to other viral communicable diseases: Secondary | ICD-10-CM

## 2018-09-07 DIAGNOSIS — Z20822 Contact with and (suspected) exposure to covid-19: Secondary | ICD-10-CM

## 2018-09-07 DIAGNOSIS — R51 Headache: Secondary | ICD-10-CM

## 2018-09-07 DIAGNOSIS — R519 Headache, unspecified: Secondary | ICD-10-CM

## 2018-09-07 NOTE — Progress Notes (Signed)
Virtual Visit via Video Note  I connected with Dale Davis on 09/07/18 at  2:00 PM EDT by a video enabled telemedicine application and verified that I am speaking with the correct person using two identifiers.  Location: Patient: Work Provider: Office   I discussed the limitations of evaluation and management by telemedicine and the availability of in person appointments. The patient expressed understanding and agreed to proceed.  History of Present Illness:  Pt reports fatigue, nausea, abdominal cramping and diarrhea. This started 1 week ago. He denies vomiting or blood in his stool. He reports he started having some sinus pressure yesterday but denies runny nose, ear pain, loss of taste or smell, sore throat or cough. He denies fever, chills or body aches. He has not tried anything OTC for his symptoms. He denies recent changes in diet. He reports he was transitioned off Clonazepam onto Trazadone about 3 weeks ago and felt fine during that transition. He has not had sick contacts that he is aware of.  He does report his BP has been elevated at home, running about 140/90's. He is not sure if this is the reason he is not feeling well. He is not taking any antihypertensive medication at this time.  Past Medical History:  Diagnosis Date  . Anxiety   . Depression   . Kidney stones   . Kidney stones     Current Outpatient Medications  Medication Sig Dispense Refill  . Armodafinil (NUVIGIL) 250 MG tablet Take 1 tablet (250 mg total) by mouth every morning. 30 tablet 0  . fluticasone (FLONASE) 50 MCG/ACT nasal spray Place 2 sprays into both nostrils daily. 16 g 0  . OLANZapine-FLUoxetine (SYMBYAX) 3-25 MG capsule Take 1 capsule by mouth every evening. 90 capsule 0  . traZODone (DESYREL) 50 MG tablet Take 0.5-1 tablets (25-50 mg total) by mouth at bedtime. Sleep 30 tablet 1   No current facility-administered medications for this visit.     Allergies  Allergen Reactions  . Flomax  [Tamsulosin Hcl] Rash    Family History  Problem Relation Age of Onset  . Kidney failure Father     Social History   Socioeconomic History  . Marital status: Married    Spouse name: [name]  . Number of children: 0  . Years of education: Not on file  . Highest education level: Not on file  Occupational History  . Occupation: Medical sales representative: [employer]  Social Needs  . Financial resource strain: Not hard at all  . Food insecurity    Worry: Never true    Inability: Never true  . Transportation needs    Medical: No    Non-medical: No  Tobacco Use  . Smoking status: Never Smoker  . Smokeless tobacco: Current User    Types: Chew  Substance and Sexual Activity  . Alcohol use: Yes    Alcohol/week: 1.0 standard drinks    Types: 1 Standard drinks or equivalent per week    Comment: occassional  . Drug use: No  . Sexual activity: Yes  Lifestyle  . Physical activity    Days per week: 7 days    Minutes per session: 20 min  . Stress: Only a little  Relationships  . Social Herbalist on phone: Twice a week    Gets together: Twice a week    Attends religious service: Never    Active member of club or organization: No    Attends  meetings of clubs or organizations: Never    Relationship status: Married  . Intimate partner violence    Fear of current or ex partner: No    Emotionally abused: No    Physically abused: No    Forced sexual activity: No  Other Topics Concern  . Not on file  Social History Narrative  . Not on file     Constitutional: Pt reports fatigue. Denies fever, malaise, headache or abrupt weight changes.  HEENT: Pt reports sinus pressure. Denies eye pain, eye redness, ear pain, ringing in the ears, wax buildup, runny nose, nasal congestion, bloody nose, or sore throat. Respiratory: Denies difficulty breathing, shortness of Davis, cough or sputum production.   Cardiovascular: Denies chest pain, chest tightness,  palpitations or swelling in the hands or feet.  Gastrointestinal: Pt reports nausea, abdominal cramping and diarrhea. Denies  bloating, constipation, or blood in the stool.  GU: Denies urgency, frequency, pain with urination, burning sensation, blood in urine, odor or discharge. Musculoskeletal: Denies decrease in range of motion, difficulty with gait, muscle pain or joint pain and swelling.  Skin: Denies redness, rashes, lesions or ulcercations.    No other specific complaints in a complete review of systems (except as listed in HPI above).  Observations/Objective: There were no vitals taken for this visit. Wt Readings from Last 3 Encounters:  05/11/18 210 lb (95.3 kg)  06/12/15 216 lb 12.8 oz (98.3 kg)  05/12/15 202 lb 6.4 oz (91.8 kg)    General: Appears his stated age, obese, in NAD. HEENT: Head: normal shape and size;  Pulmonary/Chest: Normal effort. No respiratory distress. Neurological: Alert and oriented.   BMET    Component Value Date/Time   NA 141 07/09/2018 0836   K 5.0 07/09/2018 0836   CL 102 07/09/2018 0836   CO2 25 07/09/2018 0836   GLUCOSE 98 07/09/2018 0836   GLUCOSE 97 04/24/2015 1503   BUN 17 07/09/2018 0836   CREATININE 1.15 07/09/2018 0836   CALCIUM 9.9 07/09/2018 0836   GFRNONAA 77 07/09/2018 0836   GFRAA 89 07/09/2018 0836    Lipid Panel     Component Value Date/Time   CHOL 291 (H) 07/09/2018 0836   TRIG 130 07/09/2018 0836   HDL 47 07/09/2018 0836   LDLCALC 218 (H) 07/09/2018 0836    CBC    Component Value Date/Time   WBC 3.3 (L) 07/09/2018 0836   WBC 10.6 04/24/2015 1503   RBC 4.52 07/09/2018 0836   RBC 4.17 (L) 04/24/2015 1503   HGB 14.0 07/09/2018 0836   HCT 41.0 07/09/2018 0836   PLT 174 07/09/2018 0836   MCV 91 07/09/2018 0836   MCH 31.0 07/09/2018 0836   MCH 31.4 04/24/2015 1503   MCHC 34.1 07/09/2018 0836   MCHC 35.4 04/24/2015 1503   RDW 13.2 07/09/2018 0836   LYMPHSABS 0.8 07/09/2018 0836   MONOABS 0.8 04/18/2015 1930    EOSABS 0.1 07/09/2018 0836   BASOSABS 0.0 07/09/2018 0836    Hgb A1C Lab Results  Component Value Date   HGBA1C 5.3 07/09/2018        Assessment and Plan:  Fatigue, Nausea, Abdominal Cramping, Diarrhea, Sinus Pressure:  Viral vs COVID 19 Will have him proceed to the drive up testing site for Novel Coranavirus testing Discussed the importance of mask wearing, hand washing and quarantine until results are back Advised rest, Tylenol as needed for headache He declines RX for anti nausea medication at this time Advised him to avoid antidiarrheals OTC  Will  follow up after testing, return precautions discussed   Follow Up Instructions:    I discussed the assessment and treatment plan with the patient. The patient was provided an opportunity to ask questions and all were answered. The patient agreed with the plan and demonstrated an understanding of the instructions.   The patient was advised to call back or seek an in-person evaluation if the symptoms worsen or if the condition fails to improve as anticipated.     [name], NP

## 2018-09-07 NOTE — Telephone Encounter (Signed)
-----   Message from Jearld Fenton, NP sent at 09/07/2018  2:04 PM EDT ----- Nausea, abdominal cramping, diarrhea, fatigue, sinus pressure x 1 week. No fever. No known contact. Please schedule for testing.

## 2018-09-07 NOTE — Patient Instructions (Signed)
Nausea, Adult Nausea is feeling sick to your stomach or feeling that you are about to throw up (vomit). Feeling sick to your stomach is usually not serious, but it may be an early sign of a more serious medical problem. As you feel sicker to your stomach, you may throw up. If you throw up, or if you are not able to drink enough fluids, there is a risk that you may lose too much water in your body (get dehydrated). If you lose too much water in your body, you may:  Feel tired.  Feel thirsty.  Have a dry mouth.  Have cracked lips.  Go pee (urinate) less often. Older adults and people who have other diseases or a weak body defense system (immune system) have a higher risk of losing too much water in the body. The main goals of treating this condition are:  To relieve your nausea.  To ensure your nausea occurs less often.  To prevent throwing up and losing too much fluid. Follow these instructions at home: Watch your symptoms for any changes. Tell your doctor about them. Follow these instructions as told by your doctor. Eating and drinking      Take an ORS (oral rehydration solution). This is a drink that is sold at pharmacies and stores.  Drink clear fluids in small amounts as you are able. These include: ? Water. ? Ice chips. ? Fruit juice that has water added (diluted fruit juice). ? Low-calorie sports drinks.  Eat bland, easy-to-digest foods in small amounts as you are able, such as: ? Bananas. ? Applesauce. ? Rice. ? Low-fat (lean) meats. ? Toast. ? Crackers.  Avoid drinking fluids that have a lot of sugar or caffeine in them. This includes energy drinks, sports drinks, and soda.  Avoid alcohol.  Avoid spicy or fatty foods. General instructions  Take over-the-counter and prescription medicines only as told by your doctor.  Rest at home while you get better.  Drink enough fluid to keep your pee (urine) pale yellow.  Take slow and deep breaths when you feel  sick to your stomach.  Avoid food or things that have strong smells.  Wash your hands often with soap and water. If you cannot use soap and water, use hand sanitizer.  Make sure that all people in your home wash their hands well and often.  Keep all follow-up visits as told by your doctor. This is important. Contact a doctor if:  You feel sicker to your stomach.  You feel sick to your stomach for more than 2 days.  You throw up.  You are not able to drink fluids without throwing up.  You have new symptoms.  You have a fever.  You have a headache.  You have muscle cramps.  You have a rash.  You have pain while peeing.  You feel light-headed or dizzy. Get help right away if:  You have pain in your chest, neck, arm, or jaw.  You feel very weak or you pass out (faint).  You have throw up that is bright red or looks like coffee grounds.  You have bloody or black poop (stools) or poop that looks like tar.  You have a very bad headache, a stiff neck, or both.  You have very bad pain, cramping, or bloating in your belly (abdomen).  You have trouble breathing or you are breathing very quickly.  Your heart is beating very quickly.  Your skin feels cold and clammy.  You feel confused.    You have signs of losing too much water in your body, such as: ? Dark pee, very little pee, or no pee. ? Cracked lips. ? Dry mouth. ? Sunken eyes. ? Sleepiness. ? Weakness. These symptoms may be an emergency. Do not wait to see if the symptoms will go away. Get medical help right away. Call your local emergency services (911 in the [place] Do not drive yourself to the hospital. Summary  Nausea is feeling sick to your stomach or feeling that you are about to throw up (vomit).  If you throw up, or if you are not able to drink enough fluids, there is a risk that you may lose too much water in your body (get dehydrated).  Eat and drink what your doctor tells you. Take  over-the-counter and prescription medicines only as told by your doctor.  Contact a doctor right away if your symptoms get worse or you have new symptoms.  Keep all follow-up visits as told by your doctor. This is important. This information is not intended to replace advice given to you by your health care provider. Make sure you discuss any questions you have with your health care provider. Document Released: 01/31/2011 Document Revised: 07/22/2017 Document Reviewed: 07/22/2017 Elsevier Patient Education  2020 [employer].

## 2018-09-07 NOTE — Telephone Encounter (Signed)
Pt scheduled for covid testing 09/08/18 @ [facility] @ 8:00. Instructions given and order placed

## 2018-09-08 ENCOUNTER — Other Ambulatory Visit: Payer: Self-pay

## 2018-09-08 DIAGNOSIS — Z20822 Contact with and (suspected) exposure to covid-19: Secondary | ICD-10-CM

## 2018-09-12 LAB — NOVEL CORONAVIRUS, NAA: SARS-CoV-2, NAA: NOT DETECTED

## 2018-10-04 ENCOUNTER — Other Ambulatory Visit: Payer: Self-pay | Admitting: Psychiatry

## 2018-10-04 DIAGNOSIS — G4726 Circadian rhythm sleep disorder, shift work type: Secondary | ICD-10-CM

## 2018-10-12 ENCOUNTER — Ambulatory Visit (INDEPENDENT_AMBULATORY_CARE_PROVIDER_SITE_OTHER): Payer: 59 | Admitting: Psychiatry

## 2018-10-12 ENCOUNTER — Encounter: Payer: Self-pay | Admitting: Psychiatry

## 2018-10-12 ENCOUNTER — Other Ambulatory Visit: Payer: Self-pay

## 2018-10-12 DIAGNOSIS — G4726 Circadian rhythm sleep disorder, shift work type: Secondary | ICD-10-CM | POA: Diagnosis not present

## 2018-10-12 DIAGNOSIS — F3162 Bipolar disorder, current episode mixed, moderate: Secondary | ICD-10-CM | POA: Diagnosis not present

## 2018-10-12 MED ORDER — ESZOPICLONE 1 MG PO TABS: 1.0000 mg | ORAL_TABLET | Freq: Every evening | ORAL | 0 refills | Status: DC | PRN

## 2018-10-12 NOTE — Progress Notes (Signed)
Virtual Visit via Video Note  I connected with Dale Davis on 10/12/18 at 10:30 AM EDT by a video enabled telemedicine application and verified that I am speaking with the correct person using two identifiers.   I discussed the limitations of evaluation and management by telemedicine and the availability of in person appointments. The patient expressed understanding and agreed to proceed.    I discussed the assessment and treatment plan with the patient. The patient was provided an opportunity to ask questions and all were answered. The patient agreed with the plan and demonstrated an understanding of the instructions.   The patient was advised to call back or seek an in-person evaluation if the symptoms worsen or if the condition fails to improve as anticipated.  [facility] MD  OP Progress Note  10/12/2018 5:08 PM Dale Davis  MRN:  [id number]  Chief Complaint:  Chief Complaint    Follow-up     HPI: Dale Davis is a 43 year old Caucasian male, married, employed, lives in [place], has a history of bipolar disorder, shiftwork disorder, benzodiazepine dependence was evaluated by telemedicine today.  Patient today reports he has been struggling with lack of motivation, anhedonia, low sexual desire the past few weeks.  He reports all this may have started after he stopped taking the clonazepam.  Patient reports he also has been having sleep problems.  He reports he tried the trazodone however it is not helpful.  He tried going up on the dosage and that just makes him groggy during the day.  He reports he is currently taking Nuvigil during the day.  It helps to some extent.  He takes it for shiftwork disorder.  Patient denies any suicidality, homicidality or perceptual disturbances.  Patient denies any recent stressors other than the COVID-19 outbreak.  Patient reports he wants to know why he cannot be put back on his Klonopin.  He reports he does not want to keep trying different medications for  his sleep and just wants to go back to the clonazepam.  Some time was spent providing him education about benzodiazepines in general.  Patient agrees with new trial of sleep medication.    Also discussed referral for psychotherapy sessions and he agrees with plan.   Visit Diagnosis:    ICD-10-CM   1. Bipolar 1 disorder, mixed, moderate (HCC)  F31.62 eszopiclone (LUNESTA) 1 MG TABS tablet  2. 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  Past Medical History:  Past Medical History:  Diagnosis Date  . Anxiety   . Depression   . Kidney stones   . Kidney stones     Past Surgical History:  Procedure Laterality Date  . 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  Family History:  Family History  Problem Relation Age of Onset  . Kidney failure Father     Social History: I have reviewed social history from my progress note on 06/26/2018. Social History   Socioeconomic History  . Marital status: Married    Spouse name: Dale Davis  . Number of children: 0  . Years of education: Not on file  . Highest education level: Not on file  Occupational History  . Occupation: Medical sales representative: [employer]  Social Needs  . Financial resource strain: Not hard  at all  . Food insecurity    Worry: Never true    Inability: Never true  . Transportation needs    Medical: No    Non-medical: No  Tobacco Use  . Smoking status: Never Smoker  . Smokeless tobacco: Current User    Types: Chew  Substance and Sexual Activity  . Alcohol use: Yes    Alcohol/week: 1.0 standard drinks    Types: 1 Standard drinks or equivalent per week    Comment: occassional  . Drug use: No  . Sexual activity: Yes  Lifestyle  . Physical activity    Days per week: 7 days    Minutes per  session: 20 min  . Stress: Only a little  Relationships  . Social Herbalist on phone: Twice a week    Gets together: Twice a week    Attends religious service: Never    Active member of club or organization: No    Attends meetings of clubs or organizations: Never    Relationship status: Married  Other Topics Concern  . Not on file  Social History Narrative  . Not on file    Allergies:  Allergies  Allergen Reactions  . Flomax [Tamsulosin Hcl] Rash    Metabolic Disorder Labs: Lab Results  Component Value Date   HGBA1C 5.3 07/09/2018   Lab Results  Component Value Date   PROLACTIN 13.7 07/09/2018   Lab Results  Component Value Date   CHOL 291 (H) 07/09/2018   TRIG 130 07/09/2018   HDL 47 07/09/2018   LDLCALC 218 (H) 07/09/2018   Lab Results  Component Value Date   TSH 1.830 07/09/2018    Therapeutic Level Labs: No results found for: LITHIUM No results found for: VALPROATE No components found for:  CBMZ  Current Medications: Current Outpatient Medications  Medication Sig Dispense Refill  . Armodafinil 250 MG tablet TAKE 1 TABLET (250 MG TOTAL) BY MOUTH EVERY MORNING. 30 tablet 0  . eszopiclone (LUNESTA) 1 MG TABS tablet Take 1 tablet (1 mg total) by mouth at bedtime as needed for sleep. Take immediately before bedtime 30 tablet 0  . fluticasone (FLONASE) 50 MCG/ACT nasal spray Place 2 sprays into both nostrils daily. 16 g 0  . OLANZapine-FLUoxetine (SYMBYAX) 3-25 MG capsule Take 1 capsule by mouth every evening. 90 capsule 0   No current facility-administered medications for this visit.      Musculoskeletal: Strength & Muscle Tone: UTA Gait & Station: normal Patient leans: N/A  Psychiatric Specialty Exam: Review of Systems  Constitutional: Positive for malaise/fatigue.  Psychiatric/Behavioral: Positive for depression. The patient has insomnia.   All other systems reviewed and are negative.   There were no vitals taken for this visit.There  is no height or weight on file to calculate BMI.  General Appearance: Casual  Eye Contact:  Fair  Speech:  Clear and Coherent  Volume:  Normal  Mood:  Depressed  Affect:  Congruent  Thought Process:  Goal Directed and Descriptions of Associations: Intact  Orientation:  Full (Time, Place, and Person)  Thought Content: Logical   Suicidal Thoughts:  No  Homicidal Thoughts:  No  Memory:  Immediate;   Fair Recent;   Fair Remote;   Fair  Judgement:  Fair  Insight:  Fair  Psychomotor Activity:  Normal  Concentration:  Concentration: Fair and Attention Span: Fair  Recall:  [employer] of Knowledge: Fair  Language: Fair  Akathisia:  No  Handed:  Right  AIMS (  if indicated): Denies tremors, rigidity  Assets:  Communication Skills Desire for Improvement Social Support Talents/Skills  ADL's:  Intact  Cognition: WNL  Sleep:  Poor   Screenings:   Assessment and Plan: Dale Davis is a 43 year old Caucasian male, married, employed, lives in [place], has a history of bipolar disorder, shiftwork disorder was evaluated by telemedicine today.  Patient is biologically predisposed given his history of mental health problems in the family.  He also has current psychosocial stressors of COVID-19 outbreak.  Patient currently struggles with depressive symptoms and will benefit from medication readjustment.  Plan as noted below.  Plan For bipolar disorder- unstable Symbyax 3-25 mg p.o. daily-patient did not tolerate a dosage increase of olanzapine previously.  He is hence worried about going up on the dosage of Symbyax which has olanzapine in it. Discussed adding a new mood stabilizer to augment the Symbyax if he continues to struggle with depressive symptoms. Discontinue trazodone for side effects. Add Lunesta 1 mg p.o. nightly as needed for sleep.   For benzodiazepine dependence-in early remission We will monitor closely.   For shiftwork disorder-stable Continue half tablet of Nuvigil-250 mg in the  morning.  We will refer him for psychotherapy session with therapist here in clinic.  Follow-up in clinic in 1 month or sooner if needed.  Appointment scheduled for September 22 at 10:45 AM  I have spent atleast 15 minutes non face to face with patient today. More than 50 % of the time was spent for psychoeducation and supportive psychotherapy and care coordination.  This note was generated in part or whole with voice recognition software. Voice recognition is usually quite accurate but there are transcription errors that can and very often do occur. I apologize for any typographical errors that were not detected and corrected.        Ursula Alert, MD 10/12/2018, 5:08 PM

## 2018-10-12 NOTE — Patient Instructions (Signed)
Eszopiclone tablets What is this medicine? ESZOPICLONE (es ZOE pi clone) is used to treat insomnia. This medicine helps you to fall asleep and sleep through the night. This medicine may be used for other purposes; ask your health care provider or pharmacist if you have questions. COMMON BRAND NAME(S): Lunesta What should I tell my health care provider before I take this medicine? They need to know if you have any of these conditions:  depression  history of a drug or alcohol abuse problem  liver disease  lung or breathing disease  sleep-walking, driving, eating or other activity while not fully awake after taking a sleep medicine  suicidal thoughts  an unusual or allergic reaction to eszopiclone, other medicines, foods, dyes, or preservatives  pregnant or trying to get pregnant  breast-feeding How should I use this medicine? Take this medicine by mouth with a glass of water. Follow the directions on the prescription label. It is better to take this medicine on an empty stomach and only when you are ready for bed. Do not take your medicine more often than directed. If you have been taking this medicine for several weeks and suddenly stop taking it, you may get unpleasant withdrawal symptoms. Your doctor or health care professional may want to gradually reduce the dose. Do not stop taking this medicine on your own. Always follow your doctor or health care professional's advice. Talk to your pediatrician regarding the use of this medicine in children. Special care may be needed. Overdosage: If you think you have taken too much of this medicine contact a poison control center or emergency room at once. NOTE: This medicine is only for you. Do not share this medicine with others. What if I miss a dose? This does not apply. This medicine should only be taken immediately before going to sleep. Do not take double or extra doses. What may interact with this medicine?  herbal medicines like  kava kava, melatonin, St. John's wort and valerian  lorazepam  medicines for fungal infections like ketoconazole, fluconazole, or itraconazole  olanzapine This list may not describe all possible interactions. Give your health care provider a list of all the medicines, herbs, non-prescription drugs, or dietary supplements you use. Also tell them if you smoke, drink alcohol, or use illegal drugs. Some items may interact with your medicine. What should I watch for while using this medicine? Visit your doctor or health care professional for regular checks on your progress. Keep a regular sleep schedule by going to bed at about the same time nightly. Avoid caffeine-containing drinks in the evening hours, as caffeine can cause trouble with falling asleep. Talk to your doctor if you still have trouble sleeping. After taking this medicine, you may get up out of bed and do an activity that you do not know you are doing. The next morning, you may have no memory of this. Activities include driving a car ("sleep-driving"), making and eating food, talking on the phone, sexual activity, and sleep-walking. Serious injuries have occurred. Stop the medicine and call your doctor right away if you find out you have done any of these activities. Do not take this medicine if you have used alcohol that evening. Do not take it if you have taken another medicine for sleep. The risk of doing these sleep-related activities is higher. Do not take this medicine unless you are able to stay in bed for a full night (7 to 8 hours) before you must be active again. You may have a   decrease in mental alertness the day after use, even if you feel that you are fully awake. Tell your doctor if you will need to perform activities requiring full alertness, such as driving, the next day. Do not stand or sit up quickly after taking this medicine, especially if you are an older patient. This reduces the risk of dizzy or fainting spells. If you or  your family notice any changes in your behavior, such as new or worsening depression, thoughts of harming yourself, anxiety, other unusual or disturbing thoughts, or memory loss, call your doctor right away. After you stop taking this medicine, you may have trouble falling asleep. This is called rebound insomnia. This problem usually goes away on its own after 1 or 2 nights. What side effects may I notice from receiving this medicine? Side effects that you should report to your doctor or health care professional as soon as possible:  allergic reactions like skin rash, itching or hives, swelling of the face, lips, or tongue  changes in vision  confusion  depressed mood  feeling faint or lightheaded, falls  hallucinations  problems with balance, speaking, walking  restlessness, excitability, or feelings of agitation  unusual activities while not fully awake like driving, eating, making phone calls Side effects that usually do not require medical attention (report to your doctor or health care professional if they continue or are bothersome):  dizziness, or daytime drowsiness, sometimes called a hangover effect  headache This list may not describe all possible side effects. Call your doctor for medical advice about side effects. You may report side effects to FDA at 1-800-FDA-1088. Where should I keep my medicine? Keep out of the reach of children. This medicine can be abused. Keep your medicine in a safe place to protect it from theft. Do not share this medicine with anyone. Selling or giving away this medicine is dangerous and against the law. This medicine may cause accidental overdose and death if taken by other adults, children, or pets. Mix any unused medicine with a substance like cat litter or coffee grounds. Then throw the medicine away in a sealed container like a sealed bag or a coffee can with a lid. Do not use the medicine after the expiration date. Store at room temperature  between 15 and 30 degrees C (59 and 86 degrees F). NOTE: This sheet is a summary. It may not cover all possible information. If you have questions about this medicine, talk to your doctor, pharmacist, or health care provider.  2020 Elsevier/Gold Standard (2017-08-08 11:57:05)  

## 2018-11-10 ENCOUNTER — Other Ambulatory Visit: Payer: Self-pay | Admitting: Psychiatry

## 2018-11-10 DIAGNOSIS — G4726 Circadian rhythm sleep disorder, shift work type: Secondary | ICD-10-CM

## 2018-11-13 NOTE — Telephone Encounter (Signed)
Sent armodafinil to pharmacy.

## 2018-11-17 ENCOUNTER — Encounter: Payer: Self-pay | Admitting: Psychiatry

## 2018-11-17 ENCOUNTER — Other Ambulatory Visit: Payer: Self-pay

## 2018-11-17 ENCOUNTER — Ambulatory Visit (INDEPENDENT_AMBULATORY_CARE_PROVIDER_SITE_OTHER): Payer: 59 | Admitting: Psychiatry

## 2018-11-17 DIAGNOSIS — F3162 Bipolar disorder, current episode mixed, moderate: Secondary | ICD-10-CM

## 2018-11-17 DIAGNOSIS — F5105 Insomnia due to other mental disorder: Secondary | ICD-10-CM | POA: Diagnosis not present

## 2018-11-17 DIAGNOSIS — F1320 Sedative, hypnotic or anxiolytic dependence, uncomplicated: Secondary | ICD-10-CM | POA: Diagnosis not present

## 2018-11-17 DIAGNOSIS — G4726 Circadian rhythm sleep disorder, shift work type: Secondary | ICD-10-CM | POA: Diagnosis not present

## 2018-11-17 DIAGNOSIS — G4700 Insomnia, unspecified: Secondary | ICD-10-CM | POA: Insufficient documentation

## 2018-11-17 MED ORDER — FLUOXETINE HCL 20 MG PO CAPS: 20.0000 mg | ORAL_CAPSULE | Freq: Every day | ORAL | 3 refills | Status: DC

## 2018-11-17 MED ORDER — ARMODAFINIL 250 MG PO TABS: 125.0000 mg | ORAL_TABLET | Freq: Every morning | ORAL | 0 refills | Status: DC

## 2018-11-17 MED ORDER — ZOLPIDEM TARTRATE 5 MG PO TABS: 5.0000 mg | ORAL_TABLET | Freq: Every evening | ORAL | 0 refills | Status: DC | PRN

## 2018-11-17 NOTE — Progress Notes (Signed)
Virtual Visit via Video Note  I connected with Dale Davis on 11/17/18 at 10:45 AM EDT by a video enabled telemedicine application and verified that I am speaking with the correct person using two identifiers.   I discussed the limitations of evaluation and management by telemedicine and the availability of in person appointments. The patient expressed understanding and agreed to proceed.   I discussed the assessment and treatment plan with the patient. The patient was provided an opportunity to ask questions and all were answered. The patient agreed with the plan and demonstrated an understanding of the instructions.   The patient was advised to call back or seek an in-person evaluation if the symptoms worsen or if the condition fails to improve as anticipated.   [name] Progress Note  11/17/2018 5:48 PM PROMETHEUS CRONEY  MRN:  [id number]  Chief Complaint:  Chief Complaint    Follow-up     HPI: Dale Davis is a 43 year old Caucasian male, married, employed, lives in [place], has a history of bipolar disorder, shiftwork disorder, benzodiazepine dependence, insomnia was evaluated by telemedicine today.  Patient today appeared to be irritable.  Patient is upset about the fact that he was taken off of the Klonopin.  Patient today reports that he stopped taking the Lunesta which was prescribed to him last visit.  He reports it gave him a bad taste in his mouth and did not help much so he did not even take it after a few days.  Patient also did not reach out to writer to discuss any further changes with medications.  Patient reports that he stopped the Symbyax himself.  He reports he started taking the Prozac again however he has been taking it at bedtime hoping that will help him with his sleep.  He reports he however does not think the Prozac is also helping him with his sleep.  Discussed with patient that Prozac is activating and may not help with sleep.  Discussed that it has to be taken in  the morning.  Patient also upset today wondering why Probation officer gave him medications like trazodone initially instead of medications like Lunesta or Ambien.  Discussed with patient that medications like Lunesta or Ambien are habit-forming and that is one of the recent trazodone was initially chosen since he had never tried trazodone in the past.  Patient however continued to get upset and reported that he has not gotten any help from Probation officer after all these visits and does not think it is working out for him.  Patient however reports he would like to try the Ambien and that will be the last medication he will try from [place] and if that does not work he is going to find another provider.  Patient denies any suicidality, homicidality or perceptual disturbances.  Visit Diagnosis:    ICD-10-CM   1. Bipolar 1 disorder, mixed, moderate (HCC)  F31.62 FLUoxetine (PROZAC) 20 MG capsule  2. Shift work sleep disorder  G47.26 Armodafinil 250 MG tablet  3. Benzodiazepine dependence (HCC)  F13.20   4. Insomnia due to mental disorder  F51.05 zolpidem (AMBIEN) 5 MG tablet    Past Psychiatric History: I have reviewed past psychiatric history from my progress note on 06/26/2018.  Past trials of medications like Symbyax, trazodone, Lunesta, Klonopin  Past Medical History:  Past Medical History:  Diagnosis Date  . Anxiety   . Depression   . Kidney stones   . Kidney stones     Past Surgical History:  Procedure Laterality Date  . 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 Kidney failure Father     Social History: I have reviewed social history from my progress note on 10/12/2018 Social History   Socioeconomic History  . Marital status: Married    Spouse name: Dale Davis  . Number of children: 0  . Years of education: Not on file   . Highest education level: Not on file  Occupational History  . Occupation: Medical sales representative: [employer]  Social Needs  . Financial resource strain: Not hard at all  . Food insecurity    Worry: Never true    Inability: Never true  . Transportation needs    Medical: No    Non-medical: No  Tobacco Use  . Smoking status: Never Smoker  . Smokeless tobacco: Current User    Types: Chew  Substance and Sexual Activity  . Alcohol use: Yes    Alcohol/week: 1.0 standard drinks    Types: 1 Standard drinks or equivalent per week    Comment: occassional  . Drug use: No  . Sexual activity: Yes  Lifestyle  . Physical activity    Days per week: 7 days    Minutes per session: 20 min  . Stress: Only a little  Relationships  . Social Herbalist on phone: Twice a week    Gets together: Twice a week    Attends religious service: Never    Active member of club or organization: No    Attends meetings of clubs or organizations: Never    Relationship status: Married  Other Topics Concern  . Not on file  Social History Narrative  . Not on file    Allergies:  Allergies  Allergen Reactions  . Flomax [Tamsulosin Hcl] Rash    Metabolic Disorder Labs: Lab Results  Component Value Date   HGBA1C 5.3 07/09/2018   Lab Results  Component Value Date   PROLACTIN 13.7 07/09/2018   Lab Results  Component Value Date   CHOL 291 (H) 07/09/2018   TRIG 130 07/09/2018   HDL 47 07/09/2018   LDLCALC 218 (H) 07/09/2018   Lab Results  Component Value Date   TSH 1.830 07/09/2018    Therapeutic Level Labs: No results found for: LITHIUM No results found for: VALPROATE No components found for:  CBMZ  Current Medications: Current Outpatient Medications  Medication Sig Dispense Refill  . Armodafinil 250 MG tablet Take 0.5 tablets (125 mg total) by mouth every morning. 30 tablet 0  . FLUoxetine (PROZAC) 20 MG capsule Take 1 capsule (20 mg total) by mouth  daily with breakfast. 30 capsule 3  . fluticasone (FLONASE) 50 MCG/ACT nasal spray Place 2 sprays into both nostrils daily. 16 g 0  . zolpidem (AMBIEN) 5 MG tablet Take 1 tablet (5 mg total) by mouth at bedtime as needed for sleep. 30 tablet 0   No current facility-administered medications for this visit.      Musculoskeletal: Strength & Muscle Tone: UTA Gait & Station: Observed as seated Patient leans: N/A  Psychiatric Specialty Exam: Review of Systems  Psychiatric/Behavioral: The patient is nervous/anxious and has insomnia.   All other systems reviewed and are negative.   There were no vitals taken for this visit.There is no height or  weight on file to calculate BMI.  General Appearance: Casual  Eye Contact:  Fair  Speech:  Clear and Coherent  Volume:  Normal  Mood:  Angry, Anxious and Irritable  Affect:  Labile  Thought Process:  Goal Directed and Descriptions of Associations: Intact  Orientation:  Full (Time, Place, and Person)  Thought Content: Logical   Suicidal Thoughts:  No  Homicidal Thoughts:  No  Memory:  Immediate;   Fair Recent;   Fair Remote;   Fair  Judgement:  Fair  Insight:  Fair  Psychomotor Activity:  Normal  Concentration:  Concentration: Fair and Attention Span: Fair  Recall:  [employer] of Knowledge: Fair  Language: Fair  Akathisia:  No  Handed:  Right  AIMS (if indicated): UTA  Assets:  Communication Skills Desire for Improvement Housing Social Support Talents/Skills  ADL's:  Intact  Cognition: WNL  Sleep:  Poor   Screenings:   Assessment and Plan: Dale Davis is a 43 year old Caucasian male, married, employed, lives in [place], has a history of bipolar disorder, shiftwork disorder was evaluated by telemedicine today.  Patient is biologically predisposed given his history of mental health problems in the family.  He also has current psychosocial stressors of COVID-19 outbreak.  Patient continues to struggle with sleep and is irritable .   Discussed plan as noted below.  For bipolar disorder-unstable Patient made medication changes himself. He stopped Symbyax since his last visit with Probation officer. He restarted himself on fluoxetine which he has been taking at bedtime. Discussed with patient to take the fluoxetine in the morning to see if that will help him to rest better at night. Discontinue Lunesta-for noncompliance. Start Ambien 5 mg p.o. nightly for sleep Discussed sleep hygiene techniques.  For benzodiazepine dependence in early remission We will continue to monitor closely.  For shiftwork disorder- stable Patient reports he has been taking half tablet of Nuvigil-250 mg in the morning.  For insomnia-unstable Discussed sleep hygiene techniques. Patient reports he watches TV when he gets out of bed once he cannot sleep.  Discussed with patient the effect of activities like watching TV and anything stimulating on his sleep structure. We will start Ambien 5 mg p.o. nightly for sleep   Patient today reports he is upset since his medications are not helpful.  Patient has been unhappy since clonazepam has been discontinued.  Patient also was hesitant with making medication changes in the past especially with Symbyax.  However he is currently back on just the Prozac since he continues to have sleep problems at night.    Follow-up in clinic in 2 to 3 weeks or sooner if needed.  October 13 at 1 PM I have spent atleast 15 minutes non  face to face with patient today. More than 50 % of the time was spent for psychoeducation and supportive psychotherapy and care coordination. This note was generated in part or whole with voice recognition software. Voice recognition is usually quite accurate but there are transcription errors that can and very often do occur. I apologize for any typographical errors that were not detected and corrected.      Ursula Alert, MD 11/17/2018, 5:48 PM

## 2018-11-17 NOTE — Patient Instructions (Signed)
Zolpidem tablets What is this medicine? ZOLPIDEM (zole PI dem) is used to treat insomnia. This medicine helps you to fall asleep and sleep through the night. This medicine may be used for other purposes; ask your health care provider or pharmacist if you have questions. COMMON BRAND NAME(S): Ambien What should I tell my health care provider before I take this medicine? They need to know if you have any of these conditions:  depression  history of drug abuse or addiction  if you often drink alcohol  liver disease  lung or breathing disease  myasthenia gravis  sleep apnea  sleep-walking, driving, eating or other activity while not fully awake after taking a sleep medicine  suicidal thoughts, plans, or attempt; a previous suicide attempt by you or a family member  an unusual or allergic reaction to zolpidem, other medicines, foods, dyes, or preservatives  pregnant or trying to get pregnant  breast-feeding How should I use this medicine? Take this medicine by mouth with a glass of water. Follow the directions on the prescription label. It is better to take this medicine on an empty stomach and only when you are ready for bed. Do not take your medicine more often than directed. If you have been taking this medicine for several weeks and suddenly stop taking it, you may get unpleasant withdrawal symptoms. Your doctor or health care professional may want to gradually reduce the dose. Do not stop taking this medicine on your own. Always follow your doctor or health care professional's advice. A special MedGuide will be given to you by the pharmacist with each prescription and refill. Be sure to read this information carefully each time. Talk to your pediatrician regarding the use of this medicine in children. Special care may be needed. Overdosage: If you think you have taken too much of this medicine contact a poison control center or emergency room at once. NOTE: This medicine is only  for you. Do not share this medicine with others. What if I miss a dose? This does not apply. This medicine should only be taken immediately before going to sleep. Do not take double or extra doses. What may interact with this medicine?  alcohol  antihistamines for allergy, cough and cold  certain medicines for anxiety or sleep  certain medicines for depression, like amitriptyline, fluoxetine, sertraline  certain medicines for fungal infections like ketoconazole and itraconazole  certain medicines for seizures like phenobarbital, primidone  ciprofloxacin  dietary supplements for sleep, like valerian or kava kava  general anesthetics like halothane, isoflurane, methoxyflurane, propofol  local anesthetics like lidocaine, pramoxine, tetracaine  medicines that relax muscles for surgery  narcotic medicines for pain  phenothiazines like chlorpromazine, mesoridazine, prochlorperazine, thioridazine  rifampin This list may not describe all possible interactions. Give your health care provider a list of all the medicines, herbs, non-prescription drugs, or dietary supplements you use. Also tell them if you smoke, drink alcohol, or use illegal drugs. Some items may interact with your medicine. What should I watch for while using this medicine? Visit your doctor or health care professional for regular checks on your progress. Keep a regular sleep schedule by going to bed at about the same time each night. Avoid caffeine-containing drinks in the evening hours. When sleep medicines are used every night for more than a few weeks, they may stop working. Talk to your doctor if you still have trouble sleeping. After taking this medicine, you may get up out of bed and do an activity that you  do not know you are doing. The next morning, you may have no memory of this. Activities include driving a car ("sleep-driving"), making and eating food, talking on the phone, sexual activity, and sleep-walking.  Serious injuries have occurred. Stop the medicine and call your doctor right away if you find out you have done any of these activities. Do not take this medicine if you have used alcohol that evening. Do not take it if you have taken another medicine for sleep. The risk of doing these sleep-related activities is higher. Wait for at least 8 hours after you take a dose before driving or doing other activities that require full mental alertness. Do not take this medicine unless you are able to stay in bed for a full night (7 to 8 hours) before you must be active again. You may have a decrease in mental alertness the day after use, even if you feel that you are fully awake. Tell your doctor if you will need to perform activities requiring full alertness, such as driving, the next day. Do not stand or sit up quickly after taking this medicine, especially if you are an older patient. This reduces the risk of dizzy or fainting spells. If you or your family notice any changes in your behavior, such as new or worsening depression, thoughts of harming yourself, anxiety, other unusual or disturbing thoughts, or memory loss, call your doctor right away. After you stop taking this medicine, you may have trouble falling asleep. This is called rebound insomnia. This problem usually goes away on its own after 1 or 2 nights. What side effects may I notice from receiving this medicine? Side effects that you should report to your doctor or health care professional as soon as possible:  allergic reactions like skin rash, itching or hives, swelling of the face, lips, or tongue  breathing problems  changes in vision  confusion  depressed mood or other changes in moods or emotions  feeling faint or lightheaded, falls  hallucinations  loss of balance or coordination  loss of memory  numbness or tingling of the tongue  restlessness, excitability, or feelings of anxiety or agitation  signs and symptoms of liver  injury like dark yellow or brown urine; general ill feeling or flu-like symptoms; light-colored stools; loss of appetite; nausea; right upper belly pain; unusually weak or tired; yellowing of the eyes or skin  suicidal thoughts  unusual activities while not fully awake like driving, eating, making phone calls, or sexual activity Side effects that usually do not require medical attention (report to your doctor or health care professional if they continue or are bothersome):  dizziness  drowsiness the day after you take this medicine  headache This list may not describe all possible side effects. Call your doctor for medical advice about side effects. You may report side effects to FDA at 1-800-FDA-1088. Where should I keep my medicine? Keep out of the reach of children. This medicine can be abused. Keep your medicine in a safe place to protect it from theft. Do not share this medicine with anyone. Selling or giving away this medicine is dangerous and against the law. This medicine may cause accidental overdose and death if taken by other adults, children, or pets. Mix any unused medicine with a substance like cat litter or coffee grounds. Then throw the medicine away in a sealed container like a sealed bag or a coffee can with a lid. Do not use the medicine after the expiration date. Store at  room temperature between 20 and 25 degrees C (68 and 77 degrees F). NOTE: This sheet is a summary. It may not cover all possible information. If you have questions about this medicine, talk to your doctor, pharmacist, or health care provider.  2020 Elsevier/Gold Standard (2017-10-31 11:51:08) Insomnia Insomnia is a sleep disorder that makes it difficult to fall asleep or stay asleep. Insomnia can cause fatigue, low energy, difficulty concentrating, mood swings, and poor performance at work or school. There are three different ways to classify insomnia:  Difficulty falling asleep.  Difficulty staying  asleep.  Waking up too early in the morning. Any type of insomnia can be long-term (chronic) or short-term (acute). Both are common. Short-term insomnia usually lasts for three months or less. Chronic insomnia occurs at least three times a week for longer than three months. What are the causes? Insomnia may be caused by another condition, situation, or substance, such as:  Anxiety.  Certain medicines.  Gastroesophageal reflux disease (GERD) or other gastrointestinal conditions.  Asthma or other breathing conditions.  Restless legs syndrome, sleep apnea, or other sleep disorders.  Chronic pain.  Menopause.  Stroke.  Abuse of alcohol, tobacco, or illegal drugs.  Mental health conditions, such as depression.  Caffeine.  Neurological disorders, such as Alzheimer's disease.  An overactive thyroid (hyperthyroidism). Sometimes, the cause of insomnia may not be known. What increases the risk? Risk factors for insomnia include:  Gender. Women are affected more often than men.  Age. Insomnia is more common as you get older.  Stress.  Lack of exercise.  Irregular work schedule or working night shifts.  Traveling between different time zones.  Certain medical and mental health conditions. What are the signs or symptoms? If you have insomnia, the main symptom is having trouble falling asleep or having trouble staying asleep. This may lead to other symptoms, such as:  Feeling fatigued or having low energy.  Feeling nervous about going to sleep.  Not feeling rested in the morning.  Having trouble concentrating.  Feeling irritable, anxious, or depressed. How is this diagnosed? This condition may be diagnosed based on:  Your symptoms and medical history. Your health care provider may ask about: ? Your sleep habits. ? Any medical conditions you have. ? Your mental health.  A physical exam. How is this treated? Treatment for insomnia depends on the cause. Treatment  may focus on treating an underlying condition that is causing insomnia. Treatment may also include:  Medicines to help you sleep.  Counseling or therapy.  Lifestyle adjustments to help you sleep better. Follow these instructions at home: Eating and drinking   Limit or avoid alcohol, caffeinated beverages, and cigarettes, especially close to bedtime. These can disrupt your sleep.  Do not eat a large meal or eat spicy foods right before bedtime. This can lead to digestive discomfort that can make it hard for you to sleep. Sleep habits   Keep a sleep diary to help you and your health care provider figure out what could be causing your insomnia. Write down: ? When you sleep. ? When you wake up during the night. ? How well you sleep. ? How rested you feel the next day. ? Any side effects of medicines you are taking. ? What you eat and drink.  Make your bedroom a dark, comfortable place where it is easy to fall asleep. ? Put up shades or blackout curtains to block light from outside. ? Use a white noise machine to block noise. ? Keep the  temperature cool.  Limit screen use before bedtime. This includes: ? Watching TV. ? Using your smartphone, tablet, or computer.  Stick to a routine that includes going to bed and waking up at the same times every day and night. This can help you fall asleep faster. Consider making a quiet activity, such as reading, part of your nighttime routine.  Try to avoid taking naps during the day so that you sleep better at night.  Get out of bed if you are still awake after 15 minutes of trying to sleep. Keep the lights down, but try reading or doing a quiet activity. When you feel sleepy, go back to bed. General instructions  Take over-the-counter and prescription medicines only as told by your health care provider.  Exercise regularly, as told by your health care provider. Avoid exercise starting several hours before bedtime.  Use relaxation  techniques to manage stress. Ask your health care provider to suggest some techniques that may work well for you. These may include: ? Breathing exercises. ? Routines to release muscle tension. ? Visualizing peaceful scenes.  Make sure that you drive carefully. Avoid driving if you feel very sleepy.  Keep all follow-up visits as told by your health care provider. This is important. Contact a health care provider if:  You are tired throughout the day.  You have trouble in your daily routine due to sleepiness.  You continue to have sleep problems, or your sleep problems get worse. Get help right away if:  You have serious thoughts about hurting yourself or someone else. If you ever feel like you may hurt yourself or others, or have thoughts about taking your own life, get help right away. You can go to your nearest emergency department or call:  Your local emergency services (911 in the [place]  A suicide crisis helpline, such as the [facility] at 832 247 0798. This is open 24 hours a day. Summary  Insomnia is a sleep disorder that makes it difficult to fall asleep or stay asleep.  Insomnia can be long-term (chronic) or short-term (acute).  Treatment for insomnia depends on the cause. Treatment may focus on treating an underlying condition that is causing insomnia.  Keep a sleep diary to help you and your health care provider figure out what could be causing your insomnia. This information is not intended to replace advice given to you by your health care provider. Make sure you discuss any questions you have with your health care provider. Document Released: 02/09/2000 Document Revised: 01/24/2017 Document Reviewed: 11/21/2016 Elsevier Patient Education  2020 [employer].

## 2018-12-02 ENCOUNTER — Other Ambulatory Visit: Payer: Self-pay | Admitting: Psychiatry

## 2018-12-02 DIAGNOSIS — F3162 Bipolar disorder, current episode mixed, moderate: Secondary | ICD-10-CM

## 2018-12-08 ENCOUNTER — Encounter: Payer: Self-pay | Admitting: Psychiatry

## 2018-12-08 ENCOUNTER — Other Ambulatory Visit: Payer: Self-pay

## 2018-12-08 ENCOUNTER — Ambulatory Visit (INDEPENDENT_AMBULATORY_CARE_PROVIDER_SITE_OTHER): Payer: 59 | Admitting: Psychiatry

## 2018-12-08 ENCOUNTER — Ambulatory Visit: Payer: 59 | Admitting: Psychiatry

## 2018-12-08 DIAGNOSIS — F5105 Insomnia due to other mental disorder: Secondary | ICD-10-CM

## 2018-12-08 DIAGNOSIS — G4726 Circadian rhythm sleep disorder, shift work type: Secondary | ICD-10-CM | POA: Diagnosis not present

## 2018-12-08 DIAGNOSIS — F1320 Sedative, hypnotic or anxiolytic dependence, uncomplicated: Secondary | ICD-10-CM

## 2018-12-08 DIAGNOSIS — R251 Tremor, unspecified: Secondary | ICD-10-CM | POA: Insufficient documentation

## 2018-12-08 DIAGNOSIS — F3162 Bipolar disorder, current episode mixed, moderate: Secondary | ICD-10-CM | POA: Diagnosis not present

## 2018-12-08 MED ORDER — OLANZAPINE-FLUOXETINE HCL 3-25 MG PO CAPS: 1.0000 | ORAL_CAPSULE | Freq: Every evening | ORAL | 0 refills | Status: DC

## 2018-12-08 MED ORDER — PROPRANOLOL HCL 10 MG PO TABS: 10.0000 mg | ORAL_TABLET | Freq: Two times a day (BID) | ORAL | 1 refills | Status: DC | PRN

## 2018-12-08 MED ORDER — ZOLPIDEM TARTRATE 5 MG PO TABS: 5.0000 mg | ORAL_TABLET | Freq: Every evening | ORAL | 2 refills | Status: DC | PRN

## 2018-12-08 NOTE — Patient Instructions (Signed)
Propranolol tablets What is this medicine? PROPRANOLOL (proe PRAN oh lole) is a beta-blocker. Beta-blockers reduce the workload on the heart and help it to beat more regularly. This medicine is used to treat high blood pressure, to control irregular heart rhythms (arrhythmias) and to relieve chest pain caused by angina. It may also be helpful after a heart attack. This medicine is also used to prevent migraine headaches, relieve uncontrollable shaking (tremors), and help certain problems related to the thyroid gland and adrenal gland. This medicine may be used for other purposes; ask your health care provider or pharmacist if you have questions. COMMON BRAND NAME(S): Inderal What should I tell my health care provider before I take this medicine? They need to know if you have any of these conditions:  circulation problems or blood vessel disease  diabetes  history of heart attack or heart disease, vasospastic angina  kidney disease  liver disease  lung or breathing disease, like asthma or emphysema  pheochromocytoma  slow heart rate  thyroid disease  an unusual or allergic reaction to propranolol, other beta-blockers, medicines, foods, dyes, or preservatives  pregnant or trying to get pregnant  breast-feeding How should I use this medicine? Take this medicine by mouth with a glass of water. Follow the directions on the prescription label. Take your doses at regular intervals. Do not take your medicine more often than directed. Do not stop taking except on your the advice of your doctor or health care professional. Talk to your pediatrician regarding the use of this medicine in children. Special care may be needed. Overdosage: If you think you have taken too much of this medicine contact a poison control center or emergency room at once. NOTE: This medicine is only for you. Do not share this medicine with others. What if I miss a dose? If you miss a dose, take it as soon as you  can. If it is almost time for your next dose, take only that dose. Do not take double or extra doses. What may interact with this medicine? Do not take this medicine with any of the following medications:  feverfew  phenothiazines like chlorpromazine, mesoridazine, prochlorperazine, thioridazine This medicine may also interact with the following medications:  aluminum hydroxide gel  antipyrine  antiviral medicines for HIV or AIDS  barbiturates like phenobarbital  certain medicines for blood pressure, heart disease, irregular heart beat  cimetidine  ciprofloxacin  diazepam  fluconazole  haloperidol  isoniazid  medicines for cholesterol like cholestyramine or colestipol  medicines for mental depression  medicines for migraine headache like almotriptan, eletriptan, frovatriptan, naratriptan, rizatriptan, sumatriptan, zolmitriptan  NSAIDs, medicines for pain and inflammation, like ibuprofen or naproxen  phenytoin  rifampin  teniposide  theophylline  thyroid medicines  tolbutamide  warfarin  zileuton This list may not describe all possible interactions. Give your health care provider a list of all the medicines, herbs, non-prescription drugs, or dietary supplements you use. Also tell them if you smoke, drink alcohol, or use illegal drugs. Some items may interact with your medicine. What should I watch for while using this medicine? Visit your doctor or health care professional for regular check ups. Check your blood pressure and pulse rate regularly. Ask your health care professional what your blood pressure and pulse rate should be, and when you should contact them. You may get drowsy or dizzy. Do not drive, use machinery, or do anything that needs mental alertness until you know how this drug affects you. Do not stand or sit up  quickly, especially if you are an older patient. This reduces the risk of dizzy or fainting spells. Alcohol can make you more drowsy and  dizzy. Avoid alcoholic drinks. This medicine may increase blood sugar. Ask your healthcare provider if changes in diet or medicines are needed if you have diabetes. Do not treat yourself for coughs, colds, or pain while you are taking this medicine without asking your doctor or health care professional for advice. Some ingredients may increase your blood pressure. What side effects may I notice from receiving this medicine? Side effects that you should report to your doctor or health care professional as soon as possible:  allergic reactions like skin rash, itching or hives, swelling of the face, lips, or tongue  breathing problems  cold hands or feet  difficulty sleeping, nightmares  dry peeling skin  hallucinations  muscle cramps or weakness   signs and symptoms of high blood sugar such as being more thirsty or hungry or having to urinate more than normal. You may also feel very tired or have blurry vision.  slow heart rate  swelling of the legs and ankles  vomiting Side effects that usually do not require medical attention (report to your doctor or health care professional if they continue or are bothersome):  change in sex drive or performance  diarrhea  dry sore eyes  hair loss  nausea  weak or tired This list may not describe all possible side effects. Call your doctor for medical advice about side effects. You may report side effects to FDA at 1-800-FDA-1088. Where should I keep my medicine? Keep out of the reach of children. Store at room temperature between 15 and 30 degrees C (59 and 86 degrees F). Protect from light. Throw away any unused medicine after the expiration date. NOTE: This sheet is a summary. It may not cover all possible information. If you have questions about this medicine, talk to your doctor, pharmacist, or health care provider.  2020 Elsevier/Gold Standard (2017-12-03 08:41:59)

## 2018-12-08 NOTE — Progress Notes (Addendum)
Virtual Visit via Video Note  I connected with Dale Davis on 12/08/18 at  1:00 PM EDT by a video enabled telemedicine application and verified that I am speaking with the correct person using two identifiers.   I discussed the limitations of evaluation and management by telemedicine and the availability of in person appointments. The patient expressed understanding and agreed to proceed.    I discussed the assessment and treatment plan with the patient. The patient was provided an opportunity to ask questions and all were answered. The patient agreed with the plan and demonstrated an understanding of the instructions.   The patient was advised to call back or seek an in-person evaluation if the symptoms worsen or if the condition fails to improve as anticipated.  [facility] MD OP Progress Note  12/08/2018 6:17 PM Dale Davis  MRN:  [id number]  Chief Complaint:  Chief Complaint    Follow-up     HPI: Dale Davis is a 43 year old Caucasian male, married, employed, lives in [place], has a history of bipolar disorder, shiftwork disorder, benzodiazepine dependence, insomnia was evaluated by telemedicine today.  Patient today reports he went back to taking his Symbyax.  He reports he was not feeling well on the Prozac alone.  He reports when he started taking the Prozac in the morning since he was also taking the Nuvigil he became more jittery.  He hence stopped the Prozac.  He went back to taking his Symbyax at bedtime.  Patient reports he continued to take the Ambien and the combination of Ambien with Symbyax definitely helped him to to sleep better.  He currently reports his mood is more stable.  He denies any mood swings or irritability at this time.  Patient however does report today that he has a history of tremors.  Patient reports he has had it since he were a child.  He does not think it is medication induced however after being started on medications it may have worsened.  He reports he  does not have it all the time and has it only when he focuses on doing certain task.  He reports his previous psychiatrist had given him benztropine however that did not help.  Patient denies any suicidality, homicidality or perceptual disturbances.  Patient denies any other concerns today. Visit Diagnosis:    ICD-10-CM   1. Bipolar 1 disorder, mixed, moderate (HCC)  F31.62 OLANZapine-FLUoxetine (SYMBYAX) 3-25 MG capsule  2. 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 Benzodiazepine dependence (HCC)  F13.20   4. Insomnia due to mental disorder  F51.05 zolpidem (AMBIEN) 5 MG tablet  5. Tremor  R25.1 propranolol (INDERAL) 10 MG tablet    Past Psychiatric History: I have reviewed past psychiatric history from my progress note on 06/26/2018.  Past trials of medications like Symbyax, trazodone, Lunesta, Klonopin  Past Medical History:  Past Medical History:  Diagnosis Date  . Anxiety   . Depression   . Kidney stones   . Kidney stones     Past Surgical History:  Procedure Laterality Date  . 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  Family History:  Family History  Problem Relation Age of Onset  . Kidney failure Father     Social History: I have reviewed social history from my progress note on 06/26/2018. Social History   Socioeconomic History  . Marital status: Married    Spouse  name: Dale Davis  . Number of children: 0  . Years of education: Not on file  . Highest education level: Not on file  Occupational History  . Occupation: Medical sales representative: [employer]  Social Needs  . Financial resource strain: Not hard at all  . Food insecurity    Worry: Never true    Inability: Never true  . Transportation needs    Medical: No    Non-medical: No  Tobacco Use  . Smoking status: Never Smoker  . Smokeless tobacco: Current User    Types: Chew   Substance and Sexual Activity  . Alcohol use: Yes    Alcohol/week: 1.0 standard drinks    Types: 1 Standard drinks or equivalent per week    Comment: occassional  . Drug use: No  . Sexual activity: Yes  Lifestyle  . Physical activity    Days per week: 7 days    Minutes per session: 20 min  . Stress: Only a little  Relationships  . Social Herbalist on phone: Twice a week    Gets together: Twice a week    Attends religious service: Never    Active member of club or organization: No    Attends meetings of clubs or organizations: Never    Relationship status: Married  Other Topics Concern  . Not on file  Social History Narrative  . Not on file    Allergies:  Allergies  Allergen Reactions  . Flomax [Tamsulosin Hcl] Rash    Metabolic Disorder Labs: Lab Results  Component Value Date   HGBA1C 5.3 07/09/2018   Lab Results  Component Value Date   PROLACTIN 13.7 07/09/2018   Lab Results  Component Value Date   CHOL 291 (H) 07/09/2018   TRIG 130 07/09/2018   HDL 47 07/09/2018   LDLCALC 218 (H) 07/09/2018   Lab Results  Component Value Date   TSH 1.830 07/09/2018    Therapeutic Level Labs: No results found for: LITHIUM No results found for: VALPROATE No components found for:  CBMZ  Current Medications: Current Outpatient Medications  Medication Sig Dispense Refill  . Armodafinil 250 MG tablet Take 0.5 tablets (125 mg total) by mouth every morning. 30 tablet 0  . fluticasone (FLONASE) 50 MCG/ACT nasal spray Place 2 sprays into both nostrils daily. 16 g 0  . OLANZapine-FLUoxetine (SYMBYAX) 3-25 MG capsule Take 1 capsule by mouth every evening. 90 capsule 0  . propranolol (INDERAL) 10 MG tablet Take 1 tablet (10 mg total) by mouth 2 (two) times daily as needed. For tremors 60 tablet 1  . zolpidem (AMBIEN) 5 MG tablet Take 1 tablet (5 mg total) by mouth at bedtime as needed for sleep. 30 tablet 2   No current facility-administered medications for this  visit.      Musculoskeletal: Strength & Muscle Tone: UTA Gait & Station: Observed as seated Patient leans: N/A  Psychiatric Specialty Exam: Review of Systems  Neurological: Positive for tremors.  Psychiatric/Behavioral: Negative for hallucinations. The patient is not nervous/anxious and does not have insomnia.   All other systems reviewed and are negative.   There were no vitals taken for this visit.There is no height or weight on file to calculate BMI.  General Appearance: Casual  Eye Contact:  Fair  Speech:  Clear and Coherent  Volume:  Normal  Mood:  Euthymic  Affect:  Congruent  Thought Process:  Goal Directed and Descriptions of Associations: Intact  Orientation:  Full (Time, Place, and Person)  Thought Content: Logical   Suicidal Thoughts:  No  Homicidal Thoughts:  No  Memory:  Immediate;   Fair Recent;   Fair Remote;   Fair  Judgement:  Fair  Insight:  Fair  Psychomotor Activity:  Normal  Concentration:  Concentration: Fair and Attention Span: Fair  Recall:  [employer] of Knowledge: Fair  Language: Fair  Akathisia:  No  Handed:  Right  AIMS (if indicated): does report tremors and states he has it on and off , has had it since childhood  Assets:  Communication Skills Desire for Improvement Housing Intimacy Social Support  ADL's:  Intact  Cognition: WNL  Sleep:  improving   Screenings:   Assessment and Plan: Dale Davis is a 43 year old Caucasian male, married, employed, lives in [place], has a history of bipolar disorder, shiftwork disorder was evaluated by telemedicine today.  Patient is biologically predisposed given his history of mental health problems in his family.  He also has psychosocial stressors of COVID-19 outbreak.  Patient today reports he is currently doing well with regards to his mood.  He however continues to struggle with some tremors, discussed the following plan.  Plan For bipolar disorder-improving Symbyax 3-25 mg p.o. daily Continue  Ambien 2.5 to 5 mg p.o. nightly as needed Discussed sleep hygiene techniques.  Discussed with patient to use the Ambien only as needed, discussed side effects.  Benzodiazepine dependence in early remission We will continue to monitor closely  Shiftwork disorder-stable He takes Nuvigil 125 mg in the morning-half tablet of 250 mg.  Insomnia-improving The Symbyax and Ambien combination does work. Epworth sleep scale was done today - scored 3 .  Tremors- unstable Patient today reports he does have a history of tremors on and off.  However he may have had it since childhood.  He is not sure if medications may have caused it.  He does not have it all the time.  Unable to evaluate patient for his tremors today since this is a virtual visit. He has tried benztropine which did not work in the past. Discussed propranolol 10 mg p.o. twice daily as needed Also discussed referral to neurology for his tremors worsen.  Follow-up in clinic in 6 weeks or sooner if needed.  November 30 at 2 PM  I have spent atleast 15 minutes non face to face with patient today. More than 50 % of the time was spent for psychoeducation and supportive psychotherapy and care coordination. This note was generated in part or whole with voice recognition software. Voice recognition is usually quite accurate but there are transcription errors that can and very often do occur. I apologize for any typographical errors that were not detected and corrected.        Ursula Alert, MD 12/08/2018, 6:17 PM

## 2018-12-14 ENCOUNTER — Other Ambulatory Visit: Payer: Self-pay | Admitting: Psychiatry

## 2018-12-14 DIAGNOSIS — F3162 Bipolar disorder, current episode mixed, moderate: Secondary | ICD-10-CM

## 2018-12-30 ENCOUNTER — Other Ambulatory Visit: Payer: Self-pay | Admitting: Psychiatry

## 2018-12-30 DIAGNOSIS — R251 Tremor, unspecified: Secondary | ICD-10-CM

## 2019-01-06 ENCOUNTER — Other Ambulatory Visit: Payer: Self-pay

## 2019-01-06 DIAGNOSIS — Z20822 Contact with and (suspected) exposure to covid-19: Secondary | ICD-10-CM

## 2019-01-08 LAB — NOVEL CORONAVIRUS, NAA: SARS-CoV-2, NAA: NOT DETECTED

## 2019-01-10 ENCOUNTER — Other Ambulatory Visit: Payer: Self-pay | Admitting: Psychiatry

## 2019-01-10 DIAGNOSIS — G4726 Circadian rhythm sleep disorder, shift work type: Secondary | ICD-10-CM

## 2019-01-12 MED ORDER — ARMODAFINIL 250 MG PO TABS: 125.0000 mg | ORAL_TABLET | Freq: Every morning | ORAL | 0 refills | Status: DC

## 2019-01-12 NOTE — Telephone Encounter (Signed)
I have sent armodafinil 125 mg to pharmacy. I have reviewed [place] controlled substance database.

## 2019-01-23 ENCOUNTER — Other Ambulatory Visit: Payer: Self-pay | Admitting: Psychiatry

## 2019-01-23 DIAGNOSIS — R251 Tremor, unspecified: Secondary | ICD-10-CM

## 2019-01-24 NOTE — Progress Notes (Signed)
Patient arrived 30 minutes late for appointment

## 2019-01-25 ENCOUNTER — Other Ambulatory Visit: Payer: Self-pay

## 2019-01-25 ENCOUNTER — Ambulatory Visit (INDEPENDENT_AMBULATORY_CARE_PROVIDER_SITE_OTHER): Payer: 59 | Admitting: Psychiatry

## 2019-01-25 DIAGNOSIS — Z5329 Procedure and treatment not carried out because of patient's decision for other reasons: Secondary | ICD-10-CM | POA: Insufficient documentation

## 2019-01-25 DIAGNOSIS — Z91199 Patient's noncompliance with other medical treatment and regimen due to unspecified reason: Secondary | ICD-10-CM | POA: Insufficient documentation

## 2019-01-28 NOTE — Progress Notes (Signed)
Virtual Visit via Video Note  I connected with Dale Davis on 01/29/19 at  8:45 AM EST by a video enabled telemedicine application and verified that I am speaking with the correct person using two identifiers.   I discussed the limitations of evaluation and management by telemedicine and the availability of in person appointments. The patient expressed understanding and agreed to proceed.    I discussed the assessment and treatment plan with the patient. The patient was provided an opportunity to ask questions and all were answered. The patient agreed with the plan and demonstrated an understanding of the instructions.   The patient was advised to call back or seek an in-person evaluation if the symptoms worsen or if the condition fails to improve as anticipated.   [place] MD OP Progress Note  01/29/2019 8:32 AM [facility]  MRN:  [id number]  Chief Complaint:  Chief Complaint    Follow-up     HPI:  Dale Davis is a 43 year old Caucasian male, married, employed, lives in [place], has a history of bipolar disorder, shiftwork disorder, benzodiazepine dependence, insomnia was evaluated by telemedicine today.  Patient today reports he is doing well with regards to his mood symptoms.  He denies any significant mood lability, irritability or anger issues.  He reports he is compliant on his Symbyax.  He denies any side effects.  He does have tremors however that has been going on as per report since his childhood and he is not sure if this is related to the Symbyax.  He is currently taking propanolol and reports he takes only 1 dose daily and that seems to help.  Patient reports sleep is good on the Ambien.  He denies side effects.  He continues to take Nuvigil half tablet daily which helps him.  Patient reports he currently does not have any suicidality, homicidality or perceptual disturbances.  Patient denies any other concerns today.  Reviewed and discussed most recent medical records  received from his previous psychiatrist-Dr. Rosine Door- " dated 07/11/2017, 01/02/2018, 03/30/2016-patient with diagnosis of major depressive disorder in partial remission, panic disorder, circadian rhythm sleep disorder, rule out bipolar disorder type II, tobacco use disorder, rule out unspecified attention deficit hyperactivity disorder-continue Symbyax 3/25 mg for mood stabilization, Klonopin 0.5 mg p.o. twice daily, Nuvigil 250 mg p.o. daily, bupropion XL 150 mg p.o. daily, propanolol 10 mg p.o. 3 times daily.'  Patient reports he he would like more records requested from Dr. Rosine Door -hence will send a request today.      Visit Diagnosis:    ICD-10-CM   1. Bipolar disorder, in full remission, most recent episode mixed (HCC)  F31.78 OLANZapine-FLUoxetine (SYMBYAX) 3-25 MG capsule  2. Shift work sleep disorder  G47.26 Armodafinil 250 MG tablet  3. Benzodiazepine dependence in remission ([place])  F13.21   4. Insomnia due to mental disorder  F51.05   5. Tremor  R25.1 propranolol (INDERAL) 10 MG tablet    Past Psychiatric History: I have reviewed past psychiatric history from my progress note on 06/26/2018.  Past trials of medications like Symbyax, trazodone, Lunesta, Klonopin, Adderall XR, Wellbutrin, Chantix, propranolol, Cogentin, Prozac, Zyprexa.     Past Medical History:  Past Medical History:  Diagnosis Date  . Anxiety   . Depression   . Kidney stones   . Kidney stones     Past Surgical History:  Procedure Laterality Date  . TOE AMPUTATION Right 03/29/2015   big toe    Family Psychiatric History: I have reviewed family psychiatric history  from my progress note on 06/26/2018.  Family History:  Family History  Problem Relation Age of Onset  . Kidney failure Father     Social History: I have reviewed social history from my progress note on 06/26/2018. Social History   Socioeconomic History  . Marital status: Married    Spouse name: [name]  . Number of children: 0  . Years of  education: Not on file  . Highest education level: Not on file  Occupational History  . Occupation: Medical sales representative: [employer]  Social Needs  . Financial resource strain: Not hard at all  . Food insecurity    Worry: Never true    Inability: Never true  . Transportation needs    Medical: No    Non-medical: No  Tobacco Use  . Smoking status: Never Smoker  . Smokeless tobacco: Current User    Types: Chew  Substance and Sexual Activity  . Alcohol use: Yes    Alcohol/week: 1.0 standard drinks    Types: 1 Standard drinks or equivalent per week    Comment: occassional  . Drug use: No  . Sexual activity: Yes  Lifestyle  . Physical activity    Days per week: 7 days    Minutes per session: 20 min  . Stress: Only a little  Relationships  . Social Herbalist on phone: Twice a week    Gets together: Twice a week    Attends religious service: Never    Active member of club or organization: No    Attends meetings of clubs or organizations: Never    Relationship status: Married  Other Topics Concern  . Not on file  Social History Narrative  . Not on file    Allergies:  Allergies  Allergen Reactions  . Flomax [Tamsulosin Hcl] Rash    Metabolic Disorder Labs: Lab Results  Component Value Date   HGBA1C 5.3 07/09/2018   Lab Results  Component Value Date   PROLACTIN 13.7 07/09/2018   Lab Results  Component Value Date   CHOL 291 (H) 07/09/2018   TRIG 130 07/09/2018   HDL 47 07/09/2018   LDLCALC 218 (H) 07/09/2018   Lab Results  Component Value Date   TSH 1.830 07/09/2018    Therapeutic Level Labs: No results found for: LITHIUM No results found for: VALPROATE No components found for:  CBMZ  Current Medications: Current Outpatient Medications  Medication Sig Dispense Refill  . [START ON 02/10/2019] Armodafinil 250 MG tablet Take 0.5 tablets (125 mg total) by mouth every morning. 15 tablet 1  . fluticasone (FLONASE) 50  MCG/ACT nasal spray Place 2 sprays into both nostrils daily. 16 g 0  . OLANZapine-FLUoxetine (SYMBYAX) 3-25 MG capsule Take 1 capsule by mouth every evening. 90 capsule 0  . propranolol (INDERAL) 10 MG tablet Take 1 tablet (10 mg total) by mouth daily as needed. For tremors 30 tablet 2  . zolpidem (AMBIEN) 5 MG tablet Take 1 tablet (5 mg total) by mouth at bedtime as needed for sleep. 30 tablet 2   No current facility-administered medications for this visit.      Musculoskeletal: Strength & Muscle Tone: UTA Gait & Station: normal Patient leans: N/A  Psychiatric Specialty Exam: Review of Systems  Neurological: Positive for tremors (improving).  Psychiatric/Behavioral: Negative for depression, hallucinations, substance abuse and suicidal ideas. The patient is not nervous/anxious and does not have insomnia.   All other systems reviewed and are negative.  There were no vitals taken for this visit.There is no height or weight on file to calculate BMI.  General Appearance: Casual  Eye Contact:  Fair  Speech:  Normal Rate  Volume:  Normal  Mood:  Euthymic  Affect:  Congruent  Thought Process:  Goal Directed and Descriptions of Associations: Intact  Orientation:  Full (Time, Place, and Person)  Thought Content: Logical   Suicidal Thoughts:  No  Homicidal Thoughts:  No  Memory:  Immediate;   Fair Recent;   Fair Remote;   Fair  Judgement:  Intact  Insight:  Fair  Psychomotor Activity:  Normal  Concentration:  Concentration: Fair and Attention Span: Fair  Recall:  [employer] of Knowledge: Fair  Language: Fair  Akathisia:  No  Handed:  Right  AIMS (if indicated): Patient reports tremors of bilateral hands however reports he has had it on and off since childhood.  Per report it may be unrelated to Symbyax.  Assets:  Communication Skills Desire for Improvement Social Support  ADL's:  Intact  Cognition: WNL  Sleep:  Fair   Screenings:   Assessment and Plan: Dale Davis is a  43 year old Caucasian male, married, employed, lives in [place], has a history of bipolar disorder, shiftwork disorder was evaluated by telemedicine today.  Patient is biologically predisposed given his history of mental health problems in his family.  He also has psychosocial stressors of COVID-19 outbreak.  Patient is currently making progress on the current medication regimen.  He does struggle with tremors as documented above which is currently improving.  Plan as noted below.  Plan Bipolar disorder-in remission Symbyax 3-25 mg p.o. daily Ambien 2.5 to 5 mg p.o. nightly as needed-discussed with patient to limit the use of Ambien, discussed that it is habit-forming. Discussed sleep hygiene techniques.  Benzodiazepine dependence in early remission We will continue to monitor closely  Shiftwork disorder-stable Nuvigil 125 mg p.o. daily in the morning-half tablet of 250 mg p.o. daily. Reviewed [place] controlled substance database.  Insomnia-improving Symbyax and Ambien combination does work. Epworth sleep scale was done on 12/08/2018 -score 3.   Tremors-improving Patient reports history of chronic tremors since childhood.  He reports it comes and goes. He has tried benztropine which did not work in the past Change propranolol to 10 mg p.o. daily as needed, patient reports he takes it only once a day. We will consider referral to neurology as needed in the future.  I have reviewed medical records per Dr. Bethanie Dicker psychiatrist dated 07/11/2017, 01/02/2018, 04/09/2016-as summarized above.  Patient carries multiple diagnoses including major depressive disorderwith psychosis, panic disorder without agoraphobia, rule out bipolar 2 disorder, hypersomnia, circadian rhythm sleep disorder, rule out ADHD unspecified'  Follow-up in clinic in 2 month or sooner if needed.  February 10 at 11 AM  I have spent atleast 25 minutes non face to face with patient today. More than 50 % of the time was spent  for psychoeducation and supportive psychotherapy and care coordination. This note was generated in part or whole with voice recognition software. Voice recognition is usually quite accurate but there are transcription errors that can and very often do occur. I apologize for any typographical errors that were not detected and corrected.       Ursula Alert, MD 01/29/2019, 8:32 AM

## 2019-01-29 ENCOUNTER — Encounter: Payer: Self-pay | Admitting: Psychiatry

## 2019-01-29 ENCOUNTER — Other Ambulatory Visit: Payer: Self-pay

## 2019-01-29 ENCOUNTER — Ambulatory Visit (INDEPENDENT_AMBULATORY_CARE_PROVIDER_SITE_OTHER): Payer: 59 | Admitting: Psychiatry

## 2019-01-29 DIAGNOSIS — G4726 Circadian rhythm sleep disorder, shift work type: Secondary | ICD-10-CM | POA: Diagnosis not present

## 2019-01-29 DIAGNOSIS — F3178 Bipolar disorder, in full remission, most recent episode mixed: Secondary | ICD-10-CM

## 2019-01-29 DIAGNOSIS — F5105 Insomnia due to other mental disorder: Secondary | ICD-10-CM | POA: Diagnosis not present

## 2019-01-29 DIAGNOSIS — F1321 Sedative, hypnotic or anxiolytic dependence, in remission: Secondary | ICD-10-CM

## 2019-01-29 DIAGNOSIS — R251 Tremor, unspecified: Secondary | ICD-10-CM

## 2019-01-29 MED ORDER — PROPRANOLOL HCL 10 MG PO TABS: 10.0000 mg | ORAL_TABLET | Freq: Every day | ORAL | 2 refills | Status: DC | PRN

## 2019-01-29 MED ORDER — OLANZAPINE-FLUOXETINE HCL 3-25 MG PO CAPS: 1.0000 | ORAL_CAPSULE | Freq: Every evening | ORAL | 0 refills | Status: DC

## 2019-01-29 MED ORDER — ARMODAFINIL 250 MG PO TABS: 125.0000 mg | ORAL_TABLET | Freq: Every morning | ORAL | 1 refills | Status: DC

## 2019-02-19 ENCOUNTER — Other Ambulatory Visit: Payer: Self-pay | Admitting: Psychiatry

## 2019-02-19 DIAGNOSIS — R251 Tremor, unspecified: Secondary | ICD-10-CM

## 2019-03-16 ENCOUNTER — Other Ambulatory Visit: Payer: Self-pay | Admitting: Psychiatry

## 2019-03-16 DIAGNOSIS — R251 Tremor, unspecified: Secondary | ICD-10-CM

## 2019-03-21 ENCOUNTER — Other Ambulatory Visit: Payer: Self-pay | Admitting: Psychiatry

## 2019-03-21 DIAGNOSIS — F5105 Insomnia due to other mental disorder: Secondary | ICD-10-CM

## 2019-03-22 ENCOUNTER — Other Ambulatory Visit: Payer: Self-pay | Admitting: Psychiatry

## 2019-03-22 DIAGNOSIS — R251 Tremor, unspecified: Secondary | ICD-10-CM

## 2019-03-30 ENCOUNTER — Ambulatory Visit: Payer: 59 | Admitting: Psychiatry

## 2019-04-07 ENCOUNTER — Ambulatory Visit (INDEPENDENT_AMBULATORY_CARE_PROVIDER_SITE_OTHER): Payer: 59 | Admitting: Psychiatry

## 2019-04-07 ENCOUNTER — Encounter: Payer: Self-pay | Admitting: Psychiatry

## 2019-04-07 ENCOUNTER — Other Ambulatory Visit: Payer: Self-pay

## 2019-04-07 DIAGNOSIS — G4700 Insomnia, unspecified: Secondary | ICD-10-CM | POA: Diagnosis not present

## 2019-04-07 DIAGNOSIS — G4726 Circadian rhythm sleep disorder, shift work type: Secondary | ICD-10-CM | POA: Diagnosis not present

## 2019-04-07 DIAGNOSIS — F1321 Sedative, hypnotic or anxiolytic dependence, in remission: Secondary | ICD-10-CM | POA: Diagnosis not present

## 2019-04-07 DIAGNOSIS — R251 Tremor, unspecified: Secondary | ICD-10-CM

## 2019-04-07 DIAGNOSIS — F3178 Bipolar disorder, in full remission, most recent episode mixed: Secondary | ICD-10-CM | POA: Insufficient documentation

## 2019-04-07 MED ORDER — ARMODAFINIL 250 MG PO TABS: 125.0000 mg | ORAL_TABLET | Freq: Every morning | ORAL | 1 refills | Status: DC

## 2019-04-07 MED ORDER — BELSOMRA 10 MG PO TABS: 5.0000 mg | ORAL_TABLET | Freq: Every day | ORAL | 0 refills | Status: DC

## 2019-04-07 NOTE — Progress Notes (Signed)
Provider Location : [facility] Patient Location : Work  Virtual Visit via Video Note  I connected with Dale Davis on 04/07/19 at 11:00 AM EST by a video enabled telemedicine application and verified that I am speaking with the correct person using two identifiers.   I discussed the limitations of evaluation and management by telemedicine and the availability of in person appointments. The patient expressed understanding and agreed to proceed.     I discussed the assessment and treatment plan with the patient. The patient was provided an opportunity to ask questions and all were answered. The patient agreed with the plan and demonstrated an understanding of the instructions.   The patient was advised to call back or seek an in-person evaluation if the symptoms worsen or if the condition fails to improve as anticipated.    [facility] MD OP Progress Note  04/07/2019 2:20 PM Dale Davis  MRN:  [id number]  Chief Complaint:  Chief Complaint    Follow-up     HPI: Dale Davis is a 44 year old Caucasian male, married, employed, lives in [place], has a history of bipolar disorder, shiftwork disorder, benzodiazepine dependence, insomnia was evaluated by telemedicine today.  Patient reports mood wise he continues to do well.  He however reports the past 1 month or so he has been struggling with sleep issues.  The Ambien did help initially.  However sleep started getting restless again.  He tried a higher dosage of Ambien, tried 7.5 mg which did not work or made him too groggy when he woke up.  Patient also reports he tried 10 mg which also did not work or made him too groggy.  He hence wants to try another sleep aid.  Patient does report he continues to take Symbyax which helps with his mood.  He however reports it does make him restless at night however it is not to the point that it affects his sleep.  If he cannot sleep within the first 30 to 40 minutes after taking the medication he is okay and it  does not affect him much.  He however reports he does not want any medication to help with the restlessness of Symbyax since he does not believe it bothers him too much.  Patient does report continued tremors of his bilateral hands.  He reports he has been struggling with tremors even when he was younger and it may have started even before he was started on any psychotropic medications.  He does not believe the medications may have contributed much to it since he had it even before it was started.  Discussed referral to neurology and patient agrees with plan.  He takes propranolol which he does not believe is very helpful.  He does not want any dose readjustment of propranolol at this time.  Patient denies any suicidality, homicidality or perceptual disturbances.  He continues to be compliant on the Nuvigil and reports that is beneficial.  He denies any other concerns today. Visit Diagnosis:    ICD-10-CM   1. Bipolar disorder, in full remission, most recent episode mixed ([place])  F31.78   2. Shift work sleep disorder  G47.26 Armodafinil 250 MG tablet  3. Insomnia, unspecified type  G47.00 Suvorexant (BELSOMRA) 10 MG TABS  4. Benzodiazepine dependence in remission ([place])  F13.21   5. Tremor  R25.1 Ambulatory referral to Neurology    Past Psychiatric History: I have reviewed past psychiatric history from my progress note on 06/26/2018.  Past trials of medications like Symbyax, trazodone, Lunesta,  Klonopin, Adderall XR, Wellbutrin, Chantix, propranolol, Cogentin, Prozac, Zyprexa.  Past Medical History:  Past Medical History:  Diagnosis Date  . Anxiety   . Depression   . Kidney stones   . Kidney stones     Past Surgical History:  Procedure Laterality Date  . 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  Family History:  Family History  Problem Relation Age of Onset  . Kidney failure  Father     Social History: I have reviewed social history from my progress note on 06/26/2018. Social History   Socioeconomic History  . Marital status: Married    Spouse name: [name]  . Number of children: 0  . Years of education: Not on file  . Highest education level: Not on file  Occupational History  . 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 Smoking status: Never Smoker  . Smokeless tobacco: Current User    Types: Chew  Substance and Sexual Activity  . Alcohol use: Yes    Alcohol/week: 1.0 standard drinks    Types: 1 Standard drinks or equivalent per week    Comment: occassional  . Drug use: No  . Sexual activity: Yes  Other Topics Concern  . Not on file  Social History Narrative  . Not on file   Social Determinants of Health   Financial Resource Strain: Low Risk   . Difficulty of Paying Living Expenses: Not hard at all  Food Insecurity: No Food Insecurity  . Worried About Charity fundraiser in the Last Year: Never true  . Ran Out of Food in the Last Year: Never true  Transportation Needs: No Transportation Needs  . Lack of Transportation (Medical): No  . Lack of Transportation (Non-Medical): No  Physical Activity: Insufficiently Active  . Days of Exercise per Week: 7 days  . Minutes of Exercise per Session: 20 min  Stress: No Stress Concern Present  . Feeling of Stress : Only a little  Social Connections: Somewhat Isolated  . Frequency of Communication with Friends and Family: Twice a week  . Frequency of Social Gatherings with Friends and Family: Twice a week  . Attends Religious Services: Never  . Active Member of Clubs or Organizations: No  . Attends Archivist Meetings: Never  . Marital Status: Married    Allergies:  Allergies  Allergen Reactions  . Flomax [Tamsulosin Hcl] Rash    Metabolic Disorder Labs: Lab Results  Component Value Date   HGBA1C 5.3 07/09/2018   Lab Results  Component  Value Date   PROLACTIN 13.7 07/09/2018   Lab Results  Component Value Date   CHOL 291 (H) 07/09/2018   TRIG 130 07/09/2018   HDL 47 07/09/2018   LDLCALC 218 (H) 07/09/2018   Lab Results  Component Value Date   TSH 1.830 07/09/2018    Therapeutic Level Labs: No results found for: LITHIUM No results found for: VALPROATE No components found for:  CBMZ  Current Medications: Current Outpatient Medications  Medication Sig Dispense Refill  . [START ON 04/09/2019] Armodafinil 250 MG tablet Take 0.5 tablets (125 mg total) by mouth every morning. 15 tablet 1  . fluticasone (FLONASE) 50 MCG/ACT nasal spray Place 2 sprays into both nostrils daily. 16 g 0  . OLANZapine-FLUoxetine (SYMBYAX) 3-25 MG capsule Take 1 capsule by mouth every evening. 90 capsule 0  .  propranolol (INDERAL) 10 MG tablet TAKE 1 TABLET (10 MG TOTAL) BY MOUTH 2 (TWO) TIMES DAILY AS NEEDED. FOR TREMORS 180 tablet 1  . Suvorexant (BELSOMRA) 10 MG TABS Take 5-10 mg by mouth at bedtime. 30 tablet 0   No current facility-administered medications for this visit.     Musculoskeletal: Strength & Muscle Tone: UTA Gait & Station: normal Patient leans: N/A  Psychiatric Specialty Exam: Review of Systems  Neurological: Positive for tremors.  Psychiatric/Behavioral: Positive for sleep disturbance.  All other systems reviewed and are negative.   There were no vitals taken for this visit.There is no height or weight on file to calculate BMI.  General Appearance: Casual  Eye Contact:  Fair  Speech:  Clear and Coherent  Volume:  Normal  Mood:  Euthymic  Affect:  Congruent  Thought Process:  Goal Directed and Descriptions of Associations: Intact  Orientation:  Full (Time, Place, and Person)  Thought Content: Logical   Suicidal Thoughts:  No  Homicidal Thoughts:  No  Memory:  Immediate;   Fair Recent;   Fair Remote;   Fair  Judgement:  Fair  Insight:  Fair  Psychomotor Activity:  Tremor  Concentration:  Concentration:  Fair and Attention Span: Fair  Recall:  [employer] of Knowledge: Fair  Language: Fair  Akathisia:  No  Handed:  Right  AIMS (if indicated): Tremors or BL hands  Assets:  Communication Skills Desire for Improvement Housing Social Support  ADL's:  Intact  Cognition: WNL  Sleep:  Poor   Screenings:   Assessment and Plan: Dale Davis is a 44 year old Caucasian male, married, employed, lives in [place], has a history of bipolar disorder, shiftwork disorder was evaluated by telemedicine today.  Patient is biologically predisposed given his history of mental health problems in his family.  He also has psychosocial stressors of COVID-19 outbreak.  He is currently struggling with sleep issues.  He also has tremors and will benefit from neurology referral.  Discussed plan as noted below.  Plan Bipolar disorder in remission Symbyax 3-25 mg p.o. daily Discontinue Ambien for lack of benefit. Start Belsomra 5 to 10 mg p.o. nightly. Provided medication education.  Benzodiazepine dependence in early remission We will continue to monitor closely  Shiftwork disorder-stable Nuvigil 125 mg p.o. daily in the morning. I have reviewed [place] controlled substance database.   Insomnia-unstable Symbyax as prescribed Start Belsomra 5 to 10 mg p.o. nightly Epworth sleep scale done on 12/08/2018-patient reports a history of chronic tremor since childhood.    Tremors- unstable Patient has tried benztropine which did not work. Propranolol 10 mg p.o. daily as needed-does not help much. Will refer to neurology.  Follow-up in clinic in 3 to 4 weeks or sooner if needed.  March 10 at 11:30 AM   I have spent atleast 30 minutes non face to face with patient today. More than 50 % of the time was spent for preparing to see the patient ( e.g., review of test, records ), obtaining and to review and separately obtained history , ordering medications and test ,psychoeducation and supportive psychotherapy and care  coordination,as well as documenting clinical information in electronic health record,interpreting results of test and communication of results This note was generated in part or whole with voice recognition software. Voice recognition is usually quite accurate but there are transcription errors that can and very often do occur. I apologize for any typographical errors that were not detected and corrected.        Claudis Giovanelli,  MD 04/07/2019, 2:20 PM

## 2019-04-07 NOTE — Patient Instructions (Signed)
Suvorexant oral tablets What is this medicine? SUVOREXANT (su-vor-EX-ant) is used to treat insomnia. This medicine helps you to fall asleep and sleep through the night. This medicine may be used for other purposes; ask your health care provider or pharmacist if you have questions. COMMON BRAND NAME(S): Belsomra What should I tell my health care provider before I take this medicine? They need to know if you have any of these conditions:  depression  drink alcohol  drug abuse or addiction  feel sleepy or have fallen asleep suddenly during the day  history of a sudden onset of muscle weakness (cataplexy)  liver disease  lung or breathing disease, like asthma or emphysema  sleep apnea  suicidal thoughts, plans, or attempt; a previous suicide attempt by you or a family member  an unusual or allergic reaction to suvorexant, other medicines, foods, dyes, or preservatives  pregnant or trying to get pregnant  breast-feeding How should I use this medicine? Take this medicine by mouth within 30 minutes of going to bed. Do not take it unless you are able to stay in bed a full night before you must be active again. Follow the directions on the prescription label. You may take this medicine with or without a food. However, this medicine may take longer to work if you take it with or right after meals. Do not take your medicine more often than directed. Do not stop taking this medicine on your own. Always follow your doctor or health care professional's advice. A special MedGuide will be given to you by the pharmacist with each prescription and refill. Be sure to read this information carefully each time. Talk to your pediatrician regarding the use of this medicine in children. Special care may be needed. Overdosage: If you think you have taken too much of this medicine contact a poison control center or emergency room at once. NOTE: This medicine is only for you. Do not share this medicine with  others. What if I miss a dose? This medicine should only be taken immediately before going to sleep. Do not take double or extra doses. What may interact with this medicine?  alcohol  antihistamines for allergy, cough, or cold  aprepitant  boceprevir  certain antibiotics like ciprofloxacin, clarithromycin, erythromycin, telithromycin  certain antivirals for HIV or AIDS  certain medicines for anxiety or sleep  certain medicines for depression like amitriptyline, fluoxetine, nefazodone, sertraline  certain medicines for fungal infections like ketoconazole, posaconazole, fluconazole, itraconazole  certain medicines for seizures like carbamazepine, phenobarbital, primidone, phenytoin  conivaptan  digoxin  diltiazem  general anesthetics like halothane, isoflurane, methoxyflurane, propofol  grapefruit juice  imatinib  medicines that relax muscles for surgery  narcotic medicines for pain  phenothiazines like chlorpromazine, mesoridazine, prochlorperazine, thioridazine  rifampin  verapamil This list may not describe all possible interactions. Give your health care provider a list of all the medicines, herbs, non-prescription drugs, or dietary supplements you use. Also tell them if you smoke, drink alcohol, or use illegal drugs. Some items may interact with your medicine. What should I watch for while using this medicine? Visit your health care professional for regular checks on your progress. Tell your health care professional if your symptoms do not start to get better or if they get worse. Avoid caffeine-containing drinks in the evening hours. After taking this medicine, you may get up out of bed and do an activity that you do not know you are doing. The next morning, you may have no memory of this.  Activities include driving a car ("sleep-driving"), making and eating food, talking on the phone, sexual activity, and sleep-walking. Serious injuries have occurred. Call your  doctor right away if you find out you have done any of these activities. Do not take this medicine if you have used alcohol that evening. Do not take it if you have taken another medicine for sleep. °Do not take this medicine unless you are able to stay in bed for a full night (7 to 8 hours) and do not drive or perform other activities requiring full alertness within 8 hours of a dose. Do not drive, use machinery, or do anything that needs mental alertness the day after you take the 20 mg dose of this medicine. The use of lower doses (10 mg) may also cause driving impairment the next day. You may have a decrease in mental alertness the day after use, even if you feel that you are fully awake. Tell your doctor if you will need to perform activities requiring full alertness, such as driving, the next day. Do not stand or sit up quickly after taking this medicine, especially if you are an older patient. This reduces the risk of dizzy or fainting spells. °If you or your family notice any changes in your behavior, such as new or worsening depression, thoughts of harming yourself, anxiety, other unusual or disturbing thoughts, or memory loss, call your health care professional right away. °After you stop taking this medicine, you may have trouble falling asleep. This is called rebound insomnia. This problem usually goes away on its own after 1 or 2 nights. °What side effects may I notice from receiving this medicine? °Side effects that you should report to your doctor or health care professional as soon as possible: °· allergic reactions like skin rash, itching or hives, swelling of the face, lips, or tongue °· hallucinations °· periods of leg weakness lasting from seconds to a few minutes °· suicidal thoughts, mood changes °· unable to move or speak for several minutes while going to sleep or waking up °· unusual activities while not fully awake like driving, eating, making phone calls, or sexual activity °Side effects  that usually do not require medical attention (report these to your doctor or health care professional if they continue or are bothersome): °· daytime drowsiness °· headache °· nightmares or abnormal dreams °· tiredness °This list may not describe all possible side effects. Call your doctor for medical advice about side effects. You may report side effects to FDA at 1-800-FDA-1088. °Where should I keep my medicine? °Keep out of the reach of children. This medicine can be abused. Keep your medicine in a safe place to protect it from theft. Do not share this medicine with anyone. Selling or giving away this medicine is dangerous and against the law. °Store at room temperature between 15 and 30 degrees C (59 and 86 degrees F). Throw away any unused medicine after the expiration date. °NOTE: This sheet is a summary. It may not cover all possible information. If you have questions about this medicine, talk to your doctor, pharmacist, or health care provider. °© 2020 [employer] (2018-02-27 16:37:12) ° °

## 2019-04-11 ENCOUNTER — Other Ambulatory Visit: Payer: Self-pay | Admitting: Psychiatry

## 2019-04-11 DIAGNOSIS — G4726 Circadian rhythm sleep disorder, shift work type: Secondary | ICD-10-CM

## 2019-04-12 ENCOUNTER — Telehealth: Payer: Self-pay

## 2019-04-12 NOTE — Telephone Encounter (Signed)
received notice that a prior auth was needed for pt armodafinil tab 250mg 

## 2019-04-12 NOTE — Telephone Encounter (Signed)
went online and submitted the prior auth-  pa was approved until  10-10-19

## 2019-04-12 NOTE — Telephone Encounter (Signed)
approval notice was faxed and confirmed to pharmacy 

## 2019-04-19 ENCOUNTER — Telehealth: Payer: Self-pay

## 2019-04-19 DIAGNOSIS — G4700 Insomnia, unspecified: Secondary | ICD-10-CM

## 2019-04-19 MED ORDER — ZOLPIDEM TARTRATE 10 MG PO TABS: 10.0000 mg | ORAL_TABLET | Freq: Every evening | ORAL | 0 refills | Status: DC | PRN

## 2019-04-19 NOTE — Telephone Encounter (Signed)
pt called left a message that his sleeping medication is not working he has been on for about 10 days and it has been no changes.

## 2019-04-19 NOTE — Telephone Encounter (Signed)
Returned call to patient.  He reports Belsomra does not help.  He increased it up to 20 mg and that also did not work.  His sleep continues to be interrupted .  He went back to Ambien 5 mg which he believes is working better than the [employer].  Discussed increasing Ambien to 10 mg.  He is agreeable.  We will not send a prescription today.  He will make use of his supplies and let writer know by Thursday if he needs additional changes.  Patient reports he has not received any phone calls from neurology referral yet.

## 2019-04-23 ENCOUNTER — Telehealth: Payer: Self-pay

## 2019-04-23 ENCOUNTER — Other Ambulatory Visit: Payer: Self-pay | Admitting: Psychiatry

## 2019-04-23 DIAGNOSIS — F5105 Insomnia due to other mental disorder: Secondary | ICD-10-CM

## 2019-04-23 DIAGNOSIS — G4700 Insomnia, unspecified: Secondary | ICD-10-CM

## 2019-04-23 MED ORDER — ZOLPIDEM TARTRATE 10 MG PO TABS: 10.0000 mg | ORAL_TABLET | Freq: Every evening | ORAL | 0 refills | Status: DC | PRN

## 2019-04-23 NOTE — Telephone Encounter (Signed)
Notified patient.

## 2019-04-23 NOTE — Telephone Encounter (Signed)
I have sent Ambien 10 mg to pharmacy.

## 2019-04-23 NOTE — Telephone Encounter (Signed)
Patient called requesting to go back on Ambien 10mg  to be sent to [facility] on [address] in [place]. He stated he does well on it. Thank you.

## 2019-04-23 NOTE — Telephone Encounter (Signed)
Error

## 2019-05-05 ENCOUNTER — Ambulatory Visit (INDEPENDENT_AMBULATORY_CARE_PROVIDER_SITE_OTHER): Payer: 59 | Admitting: Psychiatry

## 2019-05-05 ENCOUNTER — Other Ambulatory Visit: Payer: Self-pay

## 2019-05-05 ENCOUNTER — Encounter: Payer: Self-pay | Admitting: Psychiatry

## 2019-05-05 DIAGNOSIS — F1321 Sedative, hypnotic or anxiolytic dependence, in remission: Secondary | ICD-10-CM | POA: Diagnosis not present

## 2019-05-05 DIAGNOSIS — G4700 Insomnia, unspecified: Secondary | ICD-10-CM

## 2019-05-05 DIAGNOSIS — G4726 Circadian rhythm sleep disorder, shift work type: Secondary | ICD-10-CM

## 2019-05-05 DIAGNOSIS — R251 Tremor, unspecified: Secondary | ICD-10-CM

## 2019-05-05 DIAGNOSIS — F3178 Bipolar disorder, in full remission, most recent episode mixed: Secondary | ICD-10-CM | POA: Diagnosis not present

## 2019-05-05 MED ORDER — ZOLPIDEM TARTRATE 10 MG PO TABS: 10.0000 mg | ORAL_TABLET | Freq: Every evening | ORAL | 2 refills | Status: DC | PRN

## 2019-05-05 MED ORDER — OLANZAPINE-FLUOXETINE HCL 3-25 MG PO CAPS: 1.0000 | ORAL_CAPSULE | Freq: Every evening | ORAL | 0 refills | Status: DC

## 2019-05-05 NOTE — Progress Notes (Signed)
Provider Location : [facility] Patient Location : Home  Virtual Visit via Video Note  I connected with Dale Davis on 05/05/19 at 11:30 AM EST by a video enabled telemedicine application and verified that I am speaking with the correct person using two identifiers.   I discussed the limitations of evaluation and management by telemedicine and the availability of in person appointments. The patient expressed understanding and agreed to proceed.     I discussed the assessment and treatment plan with the patient. The patient was provided an opportunity to ask questions and all were answered. The patient agreed with the plan and demonstrated an understanding of the instructions.   The patient was advised to call back or seek an in-person evaluation if the symptoms worsen or if the condition fails to improve as anticipated.  [place] MD OP Progress Note  05/05/2019 1:58 PM Dale Davis  MRN:  [id number]  Chief Complaint:  Chief Complaint    Follow-up     HPI: Dale Davis is a 44 year old Caucasian male, married, employed, lives in [place], has a history of bipolar disorder, shiftwork disorder, benzodiazepine dependence, insomnia was evaluated by telemedicine today.  Patient today reports he is currently making progress with regards to his sleep on the Ambien.  He denies any side effects.  Patient continues to be compliant on his Symbyax ,and he describes his mood symptoms are stable.  Patient continues to have tremors of bilateral upper extremities.  He describes them as intentional tremors.  He has had it since he were a teenager.  It may have worsened after being on psychotropic medications.  He has tried and failed medications like benztropine and propranolol.  He uses the propranolol as needed which helps to some extent.  Patient denies any suicidality, homicidality, perceptual disturbances.  Patient reports he has upcoming appointment with neurology for his tremors.  He denies any other  concerns today. Visit Diagnosis:    ICD-10-CM   1. Bipolar disorder, in full remission, most recent episode mixed (HCC)  F31.78 OLANZapine-FLUoxetine (SYMBYAX) 3-25 MG capsule  2. 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 Insomnia, unspecified type  G47.00 zolpidem (AMBIEN) 10 MG tablet  4. Benzodiazepine dependence in remission ([facility])  F13.21   5. Tremor  R25.1     Past Psychiatric History: I have reviewed past psychiatric history from my progress note on 06/26/2018.  Past trials of Symbyax, trazodone, Lunesta, Klonopin, Adderall extended release, Wellbutrin, Chantix, propranolol, Cogentin, Prozac, Zyprexa  Past Medical History:  Past Medical History:  Diagnosis Date  . Anxiety   . Depression   . Kidney stones   . Kidney stones     Past Surgical History:  Procedure Laterality Date  . 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 Kidney failure Father     Social History: Reviewed social history from my progress note on 06/26/2018 Social History   Socioeconomic History  . Marital status: Married    Spouse name: [name]  . Number of children: 0  . Years of education: Not on file  . Highest education level: Not on file  Occupational History  . 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 Smoking status: Never Smoker  . Smokeless tobacco: Current User    Types: Chew  Substance and Sexual Activity  . Alcohol use: Yes    Alcohol/week: 1.0 standard drinks    Types: 1 Standard drinks or equivalent per week    Comment: occassional  . Drug use: No  . Sexual activity: Yes  Other Topics Concern  . Not on file  Social History Narrative  . Not on file   Social Determinants of Health   Financial Resource Strain: Low Risk   . Difficulty of Paying Living Expenses: Not  hard at all  Food Insecurity: No Food Insecurity  . Worried About Charity fundraiser in the Last Year: Never true  . Ran Out of Food in the Last Year: Never true  Transportation Needs: No Transportation Needs  . Lack of Transportation (Medical): No  . Lack of Transportation (Non-Medical): No  Physical Activity: Insufficiently Active  . Days of Exercise per Week: 7 days  . Minutes of Exercise per Session: 20 min  Stress: No Stress Concern Present  . Feeling of Stress : Only a little  Social Connections: Somewhat Isolated  . Frequency of Communication with Friends and Family: Twice a week  . Frequency of Social Gatherings with Friends and Family: Twice a week  . Attends Religious Services: Never  . Active Member of Clubs or Organizations: No  . Attends Archivist Meetings: Never  . Marital Status: Married    Allergies:  Allergies  Allergen Reactions  . Flomax [Tamsulosin Hcl] Rash    Metabolic Disorder Labs: Lab Results  Component Value Date   HGBA1C 5.3 07/09/2018   Lab Results  Component Value Date   PROLACTIN 13.7 07/09/2018   Lab Results  Component Value Date   CHOL 291 (H) 07/09/2018   TRIG 130 07/09/2018   HDL 47 07/09/2018   LDLCALC 218 (H) 07/09/2018   Lab Results  Component Value Date   TSH 1.830 07/09/2018    Therapeutic Level Labs: No results found for: LITHIUM No results found for: VALPROATE No components found for:  CBMZ  Current Medications: Current Outpatient Medications  Medication Sig Dispense Refill  . Armodafinil 250 MG tablet Take 0.5 tablets (125 mg total) by mouth every morning. 15 tablet 1  . OLANZapine-FLUoxetine (SYMBYAX) 3-25 MG capsule Take 1 capsule by mouth every evening. 90 capsule 0  . propranolol (INDERAL) 10 MG tablet TAKE 1 TABLET (10 MG TOTAL) BY MOUTH 2 (TWO) TIMES DAILY AS NEEDED. FOR TREMORS 180 tablet 1  . [START ON 05/22/2019] zolpidem (AMBIEN) 10 MG tablet Take 1 tablet (10 mg total) by mouth at bedtime as  needed for sleep. 30 tablet 2   No current facility-administered medications for this visit.     Musculoskeletal: Strength & Muscle Tone: UTA Gait & Station: normal Patient leans: N/A  Psychiatric Specialty Exam: Review of Systems  Neurological: Positive for tremors.  Psychiatric/Behavioral: Negative for agitation, behavioral problems, confusion, decreased concentration, dysphoric mood, hallucinations, self-injury, sleep disturbance and suicidal ideas. The patient is not nervous/anxious and is not hyperactive.   All other systems reviewed and are negative.   There were no vitals taken for this visit.There is no height or weight on file to calculate BMI.  General Appearance: Casual  Eye Contact:  Fair  Speech:  Clear and Coherent  Volume:  Normal  Mood:  Euthymic  Affect:  Congruent  Thought Process:  Goal Directed and Descriptions of Associations: Intact  Orientation:  Full (Time, Place, and Person)  Thought Content: Logical   Suicidal Thoughts:  No  Homicidal Thoughts:  No  Memory:  Immediate;   Fair Recent;   Fair Remote;   Fair  Judgement:  Fair  Insight:  Fair  Psychomotor Activity:  Tremor  Concentration:  Concentration: Fair and Attention Span: Fair  Recall:  [employer] of Knowledge: Fair  Language: Fair  Akathisia:  No  Handed:  Right  AIMS (if indicated):UTA  Assets:  Communication Skills Desire for Improvement Housing Intimacy Social Support Talents/Skills Transportation Vocational/Educational  ADL's:  Intact  Cognition: WNL  Sleep:  Improving   Screenings:   Assessment and Plan: Dale Davis is a 44 year old Caucasian male, married, employed, lives in [place], has a history of bipolar disorder, shiftwork disorder was evaluated by telemedicine today.  Patient is biologically predisposed given his history of mental health problems in his family.  He also has psychosocial stressors of COVID-19 outbreak.  He is currently making progress with regards to his  sleep.  Plan as noted below.  Plan Bipolar disorder in remission Symbyax 3-25 mg p.o. daily Ambien 10 mg p.o. nightly.   Benzodiazepine dependence in early remission We will monitor closely.  Shiftwork disorder-stable Nuvigil 125 mg p.o. daily in the morning I have reviewed [place] controlled substance database  Insomnia-improving Ambien 10 mg p.o. nightly Epworth sleep scale done on 12/08/2018-low  Tremors-unstable Patient with history of chronic tremor since childhood Propranolol 10 mg p.o. daily as needed-does not help much. Patient has been referred to neurology and has upcoming appointment.  Follow-up in clinic in 6 to 8 weeks or sooner if needed.  I have spent atleast 20 minutes non face to face with patient today. More than 50 % of the time was spent for preparing to see the patient ( e.g., review of test, records ), obtaining and to review and separately obtained history , ordering medications and test ,psychoeducation and supportive psychotherapy and care coordination,as well as documenting clinical information in electronic health record. This note was generated in part or whole with voice recognition software. Voice recognition is usually quite accurate but there are transcription errors that can and very often do occur. I apologize for any typographical errors that were not detected and corrected.         Ursula Alert, MD 05/05/2019, 1:58 PM

## 2019-06-09 ENCOUNTER — Telehealth: Payer: Self-pay | Admitting: Psychiatry

## 2019-06-09 DIAGNOSIS — G4726 Circadian rhythm sleep disorder, shift work type: Secondary | ICD-10-CM

## 2019-06-09 MED ORDER — ARMODAFINIL 250 MG PO TABS: 125.0000 mg | ORAL_TABLET | Freq: Every morning | ORAL | 2 refills | Status: DC

## 2019-06-09 NOTE — Telephone Encounter (Signed)
Received treatment request for armodafinil.  I have reviewed [place] controlled substance database.  We will send medication to the pharmacy today.

## 2019-06-16 ENCOUNTER — Other Ambulatory Visit: Payer: Self-pay

## 2019-06-16 ENCOUNTER — Telehealth (INDEPENDENT_AMBULATORY_CARE_PROVIDER_SITE_OTHER): Payer: 59 | Admitting: Psychiatry

## 2019-06-16 ENCOUNTER — Encounter: Payer: Self-pay | Admitting: Psychiatry

## 2019-06-16 DIAGNOSIS — G4726 Circadian rhythm sleep disorder, shift work type: Secondary | ICD-10-CM

## 2019-06-16 DIAGNOSIS — G4700 Insomnia, unspecified: Secondary | ICD-10-CM

## 2019-06-16 DIAGNOSIS — F1321 Sedative, hypnotic or anxiolytic dependence, in remission: Secondary | ICD-10-CM | POA: Diagnosis not present

## 2019-06-16 DIAGNOSIS — R251 Tremor, unspecified: Secondary | ICD-10-CM

## 2019-06-16 DIAGNOSIS — F3178 Bipolar disorder, in full remission, most recent episode mixed: Secondary | ICD-10-CM | POA: Diagnosis not present

## 2019-06-16 MED ORDER — ZOLPIDEM TARTRATE 5 MG PO TABS: 7.5000 mg | ORAL_TABLET | Freq: Every evening | ORAL | 0 refills | Status: DC | PRN

## 2019-06-16 NOTE — Patient Instructions (Addendum)
Insomnia Insomnia is a sleep disorder that makes it difficult to fall asleep or stay asleep. Insomnia can cause fatigue, low energy, difficulty concentrating, mood swings, and poor performance at work or school. There are three different ways to classify insomnia:  Difficulty falling asleep.  Difficulty staying asleep.  Waking up too early in the morning. Any type of insomnia can be long-term (chronic) or short-term (acute). Both are common. Short-term insomnia usually lasts for three months or less. Chronic insomnia occurs at least three times a week for longer than three months. What are the causes? Insomnia may be caused by another condition, situation, or substance, such as:  Anxiety.  Certain medicines.  Gastroesophageal reflux disease (GERD) or other gastrointestinal conditions.  Asthma or other breathing conditions.  Restless legs syndrome, sleep apnea, or other sleep disorders.  Chronic pain.  Menopause.  Stroke.  Abuse of alcohol, tobacco, or illegal drugs.  Mental health conditions, such as depression.  Caffeine.  Neurological disorders, such as Alzheimer's disease.  An overactive thyroid (hyperthyroidism). Sometimes, the cause of insomnia may not be known. What increases the risk? Risk factors for insomnia include:  Gender. Women are affected more often than men.  Age. Insomnia is more common as you get older.  Stress.  Lack of exercise.  Irregular work schedule or working night shifts.  Traveling between different time zones.  Certain medical and mental health conditions. What are the signs or symptoms? If you have insomnia, the main symptom is having trouble falling asleep or having trouble staying asleep. This may lead to other symptoms, such as:  Feeling fatigued or having low energy.  Feeling nervous about going to sleep.  Not feeling rested in the morning.  Having trouble concentrating.  Feeling irritable, anxious, or depressed. How  is this diagnosed? This condition may be diagnosed based on:  Your symptoms and medical history. Your health care provider may ask about: ? Your sleep habits. ? Any medical conditions you have. ? Your mental health.  A physical exam. How is this treated? Treatment for insomnia depends on the cause. Treatment may focus on treating an underlying condition that is causing insomnia. Treatment may also include:  Medicines to help you sleep.  Counseling or therapy.  Lifestyle adjustments to help you sleep better. Follow these instructions at home: Eating and drinking   Limit or avoid alcohol, caffeinated beverages, and cigarettes, especially close to bedtime. These can disrupt your sleep.  Do not eat a large meal or eat spicy foods right before bedtime. This can lead to digestive discomfort that can make it hard for you to sleep. Sleep habits   Keep a sleep diary to help you and your health care provider figure out what could be causing your insomnia. Write down: ? When you sleep. ? When you wake up during the night. ? How well you sleep. ? How rested you feel the next day. ? Any side effects of medicines you are taking. ? What you eat and drink.  Make your bedroom a dark, comfortable place where it is easy to fall asleep. ? Put up shades or blackout curtains to block light from outside. ? Use a white noise machine to block noise. ? Keep the temperature cool.  Limit screen use before bedtime. This includes: ? Watching TV. ? Using your smartphone, tablet, or computer.  Stick to a routine that includes going to bed and waking up at the same times every day and night. This can help you fall asleep faster. Consider   making a quiet activity, such as reading, part of your nighttime routine.  Try to avoid taking naps during the day so that you sleep better at night.  Get out of bed if you are still awake after 15 minutes of trying to sleep. Keep the lights down, but try reading or  doing a quiet activity. When you feel sleepy, go back to bed. General instructions  Take over-the-counter and prescription medicines only as told by your health care provider.  Exercise regularly, as told by your health care provider. Avoid exercise starting several hours before bedtime.  Use relaxation techniques to manage stress. Ask your health care provider to suggest some techniques that may work well for you. These may include: ? Breathing exercises. ? Routines to release muscle tension. ? Visualizing peaceful scenes.  Make sure that you drive carefully. Avoid driving if you feel very sleepy.  Keep all follow-up visits as told by your health care provider. This is important. Contact a health care provider if:  You are tired throughout the day.  You have trouble in your daily routine due to sleepiness.  You continue to have sleep problems, or your sleep problems get worse. Get help right away if:  You have serious thoughts about hurting yourself or someone else. If you ever feel like you may hurt yourself or others, or have thoughts about taking your own life, get help right away. You can go to your nearest emergency department or call:  Your local emergency services (911 in the [place]  A suicide crisis helpline, such as the National Suicide Prevention Lifeline at 1-800-273-8255. This is open 24 hours a day. Summary  Insomnia is a sleep disorder that makes it difficult to fall asleep or stay asleep.  Insomnia can be long-term (chronic) or short-term (acute).  Treatment for insomnia depends on the cause. Treatment may focus on treating an underlying condition that is causing insomnia.  Keep a sleep diary to help you and your health care provider figure out what could be causing your insomnia. This information is not intended to replace advice given to you by your health care provider. Make sure you discuss any questions you have with your health care provider. Document  Revised: 01/24/2017 Document Reviewed: 11/21/2016 Elsevier Patient Education  2020 Elsevier Inc[employer] Quality Sleep Information, Adult Quality sleep is important for your mental and physical health. It also improves your quality of life. Quality sleep means you:  Are asleep for most of the time you are in bed.  Fall asleep within 30 minutes.  Wake up no more than once a night.  Are awake for no longer than 20 minutes if you do wake up during the night. Most adults need 7-8 hours of quality sleep each night. How can poor sleep affect me? If you do not get enough quality sleep, you may have:  Mood swings.  Daytime sleepiness.  Confusion.  Decreased reaction time.  Sleep disorders, such as insomnia and sleep apnea.  Difficulty with: ? Solving problems. ? Coping with stress. ? Paying attention. These issues may affect your performance and productivity at work, school, and at home. Lack of sleep may also put you at higher risk for accidents, suicide, and risky behaviors. If you do not get quality sleep you may also be at higher risk for several health problems, including:  Infections.  Type 2 diabetes.  Heart disease.  High blood pressure.  Obesity.  Worsening of long-term conditions, like arthritis, kidney disease, depression, Parkinson's disease, and epilepsy.   What actions can I take to get more quality sleep?      Stick to a sleep schedule. Go to sleep and wake up at about the same time each day. Do not try to sleep less on weekdays and make up for lost sleep on weekends. This does not work.  Try to get about 30 minutes of exercise on most days. Do not exercise 2-3 hours before going to bed.  Limit naps during the day to 30 minutes or less.  Do not use any products that contain nicotine or tobacco, such as cigarettes or e-cigarettes. If you need help quitting, ask your health care provider.  Do not drink caffeinated beverages for at least 8 hours before going to  bed. Coffee, tea, and some sodas contain caffeine.  Do not drink alcohol close to bedtime.  Do not eat large meals close to bedtime.  Do not take naps in the late afternoon.  Try to get at least 30 minutes of sunlight every day. Morning sunlight is best.  Make time to relax before bed. Reading, listening to music, or taking a hot bath promotes quality sleep.  Make your bedroom a place that promotes quality sleep. Keep your bedroom dark, quiet, and at a comfortable room temperature. Make sure your bed is comfortable. Take out sleep distractions like TV, a computer, smartphone, and bright lights.  If you are lying awake in bed for longer than 20 minutes, get up and do a relaxing activity until you feel sleepy.  Work with your health care provider to treat medical conditions that may affect sleeping, such as: ? Nasal obstruction. ? Snoring. ? Sleep apnea and other sleep disorders.  Talk to your health care provider if you think any of your prescription medicines may cause you to have difficulty falling or staying asleep.  If you have sleep problems, talk with a sleep consultant. If you think you have a sleep disorder, talk with your health care provider about getting evaluated by a specialist. Where to find more information  National Sleep Foundation website: https://sleepfoundation.org  National Heart, Lung, and Blood Institute (NHLBI): www.nhlbi.nih.gov/files/docs/public/sleep/healthy_sleep.pdf  Centers for Disease Control and Prevention (CDC): www.cdc.gov/sleep/index.html Contact a health care provider if you:  Have trouble getting to sleep or staying asleep.  Often wake up very early in the morning and cannot get back to sleep.  Have daytime sleepiness.  Have daytime sleep attacks of suddenly falling asleep and sudden muscle weakness (narcolepsy).  Have a tingling sensation in your legs with a strong urge to move your legs (restless legs syndrome).  Stop breathing briefly  during sleep (sleep apnea).  Think you have a sleep disorder or are taking a medicine that is affecting your quality of sleep. Summary  Most adults need 7-8 hours of quality sleep each night.  Getting enough quality sleep is an important part of health and well-being.  Make your bedroom a place that promotes quality sleep and avoid things that may cause you to have poor sleep, such as alcohol, caffeine, smoking, and large meals.  Talk to your health care provider if you have trouble falling asleep or staying asleep. This information is not intended to replace advice given to you by your health care provider. Make sure you discuss any questions you have with your health care provider. Document Revised: 05/21/2017 Document Reviewed: 05/21/2017 Elsevier Patient Education  2020 Elsevier Inc.  

## 2019-06-16 NOTE — Progress Notes (Signed)
Provider Location : [facility] Patient Location : Home  Virtual Visit via Video Note  I connected with Dale Davis on 06/16/19 at 11:30 AM EDT by a video enabled telemedicine application and verified that I am speaking with the correct person using two identifiers.   I discussed the limitations of evaluation and management by telemedicine and the availability of in person appointments. The patient expressed understanding and agreed to proceed.   I discussed the assessment and treatment plan with the patient. The patient was provided an opportunity to ask questions and all were answered. The patient agreed with the plan and demonstrated an understanding of the instructions.   The patient was advised to call back or seek an in-person evaluation if the symptoms worsen or if the condition fails to improve as anticipated.   [facility] MD OP Progress Note  06/16/2019 11:56 AM Dale Davis  MRN:  [id number]  Chief Complaint:  Chief Complaint    Follow-up     HPI: Dale Davis is a 44 year old Caucasian male, married, employed, lives in [place], has a history of bipolar disorder, shiftwork disorder, benzodiazepine dependence, insomnia was evaluated by telemedicine today.  Patient today reports he is currently struggling with sleep issues.  He reports the Ambien 10 mg makes him groggy in the morning and the 5 mg does not help him with falling asleep.  He does not currently follow good sleep hygiene.  He reports he watches TV until he falls asleep.    Patient denies any symptoms of depression or anxiety.  He denies any suicidality, homicidality or perceptual disturbances.  Patient reports he does struggle with sexual dysfunction.  He does not know what could be contributing to it.  He wonders if it is his age.  He is compliant on his medications.  He denies any side effects other than the sexual dysfunction which could also be likely due to his medications like Symbyax.  Patient reports he had his  consultation with neurology for his tremors.  He reports he was told he does not need medications at this time.  I have reviewed medical records in [facility] per Dr.Kofi Doonquah - " dated 05/19/2019-patient is functioning fairly well at this time and we should not prescribe medications for his symptoms.  This is a progressive condition and I suspect that he will slowly get worse.  At that time we can retry the propranolol but escalate the dose to efficacy or side effects. It appears that his symptoms are worsened with neuroleptics.  I would avoid this as much as possible.'    Visit Diagnosis:    ICD-10-CM   1. Bipolar disorder, in full remission, most recent episode mixed ([place])  F31.78   2. 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 Insomnia, unspecified type  G47.00 zolpidem (AMBIEN) 5 MG tablet    DISCONTINUED: zolpidem (AMBIEN) 5 MG tablet  4. Benzodiazepine dependence in remission ([place])  F13.21   5. Tremor  R25.1     Past Psychiatric History: I have reviewed past psychiatric history from my progress note on 06/26/2018.  Past trials of Symbyax, trazodone, Klonopin, Lunesta, Adderall extended release, Wellbutrin, Chantix, propranolol, Cogentin, Prozac, Zyprexa.  Past Medical History:  Past Medical History:  Diagnosis Date  . Anxiety   . Depression   . Kidney stones   . Kidney stones     Past Surgical History:  Procedure Laterality Date  . 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 Kidney failure Father     Social History: I have reviewed social history from my progress note on 06/26/2018 Social History   Socioeconomic History  . Marital status: Married    Spouse name: [name]  . Number of children: 0  . Years of education: Not on file  . Highest education level: Not on file  Occupational History  .  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 Smoking status: Never Smoker  . Smokeless tobacco: Current User    Types: Chew  Substance and Sexual Activity  . Alcohol use: Yes    Alcohol/week: 1.0 standard drinks    Types: 1 Standard drinks or equivalent per week    Comment: occassional  . Drug use: No  . Sexual activity: Yes  Other Topics Concern  . Not on file  Social History Narrative  . Not on file   Social Determinants of Health   Financial Resource Strain: Low Risk   . Difficulty of Paying Living Expenses: Not hard at all  Food Insecurity: No Food Insecurity  . Worried About Charity fundraiser in the Last Year: Never true  . Ran Out of Food in the Last Year: Never true  Transportation Needs: No Transportation Needs  . Lack of Transportation (Medical): No  . Lack of Transportation (Non-Medical): No  Physical Activity: Insufficiently Active  . Days of Exercise per Week: 7 days  . Minutes of Exercise per Session: 20 min  Stress: No Stress Concern Present  . Feeling of Stress : Only a little  Social Connections: Somewhat Isolated  . Frequency of Communication with Friends and Family: Twice a week  . Frequency of Social Gatherings with Friends and Family: Twice a week  . Attends Religious Services: Never  . Active Member of Clubs or Organizations: No  . Attends Archivist Meetings: Never  . Marital Status: Married    Allergies:  Allergies  Allergen Reactions  . Flomax [Tamsulosin Hcl] Rash    Metabolic Disorder Labs: Lab Results  Component Value Date   HGBA1C 5.3 07/09/2018   Lab Results  Component Value Date   PROLACTIN 13.7 07/09/2018   Lab Results  Component Value Date   CHOL 291 (H) 07/09/2018   TRIG 130 07/09/2018   HDL 47 07/09/2018   LDLCALC 218 (H) 07/09/2018   Lab Results  Component Value Date   TSH 1.830 07/09/2018    Therapeutic Level Labs: No results found for: LITHIUM No results  found for: VALPROATE No components found for:  CBMZ  Current Medications: Current Outpatient Medications  Medication Sig Dispense Refill  . Armodafinil 250 MG tablet Take 0.5 tablets (125 mg total) by mouth every morning. 15 tablet 2  . OLANZapine-FLUoxetine (SYMBYAX) 3-25 MG capsule Take 1 capsule by mouth every evening. 90 capsule 0  . propranolol (INDERAL) 10 MG tablet TAKE 1 TABLET (10 MG TOTAL) BY MOUTH 2 (TWO) TIMES DAILY AS NEEDED. FOR TREMORS 180 tablet 1  . zolpidem (AMBIEN) 5 MG tablet Take 1.5 tablets (7.5 mg total) by mouth at bedtime as needed for sleep. 45 tablet 0   No current facility-administered medications for this visit.     Musculoskeletal: Strength & Muscle Tone: UTA Gait & Station: normal Patient leans: N/A  Psychiatric Specialty Exam: Review of Systems  Neurological: Positive  for tremors.  Psychiatric/Behavioral: Positive for sleep disturbance.  All other systems reviewed and are negative.   There were no vitals taken for this visit.There is no height or weight on file to calculate BMI.  General Appearance: Casual  Eye Contact:  Fair  Speech:  Clear and Coherent  Volume:  Normal  Mood:  Euthymic  Affect:  Congruent  Thought Process:  Goal Directed and Descriptions of Associations: Intact  Orientation:  Full (Time, Place, and Person)  Thought Content: Logical   Suicidal Thoughts:  No  Homicidal Thoughts:  No  Memory:  Immediate;   Fair Recent;   Fair Remote;   Fair  Judgement:  Fair  Insight:  Fair  Psychomotor Activity:  Tremor chronic  Concentration:  Concentration: Fair and Attention Span: Fair  Recall:  [employer] of Knowledge: Fair  Language: Fair  Akathisia:  No  Handed:  Right  AIMS (if indicated): UTA  Assets:  Communication Skills Desire for Improvement Housing Social Support  ADL's:  Intact  Cognition: WNL  Sleep:  Restless   Screenings:   Assessment and Plan: Dale Davis is a 44 year old Caucasian male, married, employed, lives  in [place], has a history of bipolar disorder, shiftwork disorder was evaluated by telemedicine today.  Patient is biologically predisposed given his history of mental health problems in his family.  He also has psychosocial stressors of COVID-19 pandemic.  Patient is currently struggling with sleep issues however otherwise is doing well.  Plan as noted below.  Plan Bipolar disorder in remission Symbyax 3-25 mg p.o. daily   Benzodiazepine dependence in early remission We will monitor closely  Shiftwork disorder-stable Nuvigil 125 mg p.o. daily in the morning I have reviewed [place] controlled substance database.  Tremors-chronic Propanolol 10 mg p.o. daily as needed Patient was referred to neurology-I have reviewed notes per neurology as summarized above-dated 05/19/2019  Insomnia-unstable Reduce Ambien to 7.5 mg p.o. nightly. Discussed sleep hygiene techniques including switching of TV laptop computers phone at least an hour prior to bedtime.  Discussed relaxation techniques, cutting back on caffeine, alcohol towards bedtime.  Will have sleep hygiene tips and patient instructions.  Discussed with patient to review it and make changes.  Follow-up in clinic in 4 weeks or sooner if needed.  I have spent atleast 20 minutes non face to face with patient today. More than 50 % of the time was spent for preparing to see the patient ( e.g., review of test, records ), obtaining and to review and separately obtained history , ordering medications and test ,psychoeducation and supportive psychotherapy and care coordination,as well as documenting clinical information in electronic health record,interpreting results of test and communication of results   This note was generated in part or whole with voice recognition software. Voice recognition is usually quite accurate but there are transcription errors that can and very often do occur. I apologize for any typographical errors that were not detected and  corrected.       Ursula Alert, MD 06/16/2019, 11:56 AM

## 2019-06-21 ENCOUNTER — Encounter: Payer: Self-pay | Admitting: Internal Medicine

## 2019-06-23 ENCOUNTER — Ambulatory Visit: Payer: Self-pay | Admitting: Internal Medicine

## 2019-06-24 ENCOUNTER — Ambulatory Visit: Payer: Managed Care, Other (non HMO) | Admitting: Internal Medicine

## 2019-06-24 ENCOUNTER — Other Ambulatory Visit: Payer: Self-pay

## 2019-06-24 ENCOUNTER — Encounter: Payer: Self-pay | Admitting: Internal Medicine

## 2019-06-24 VITALS — BP 118/80 | HR 84 | Temp 98.3°F | Wt 230.0 lb

## 2019-06-24 DIAGNOSIS — F419 Anxiety disorder, unspecified: Secondary | ICD-10-CM

## 2019-06-24 DIAGNOSIS — F3162 Bipolar disorder, current episode mixed, moderate: Secondary | ICD-10-CM

## 2019-06-24 DIAGNOSIS — R5383 Other fatigue: Secondary | ICD-10-CM | POA: Diagnosis not present

## 2019-06-24 DIAGNOSIS — R6882 Decreased libido: Secondary | ICD-10-CM | POA: Diagnosis not present

## 2019-06-24 DIAGNOSIS — R4586 Emotional lability: Secondary | ICD-10-CM

## 2019-06-24 DIAGNOSIS — F5104 Psychophysiologic insomnia: Secondary | ICD-10-CM

## 2019-06-24 DIAGNOSIS — F329 Major depressive disorder, single episode, unspecified: Secondary | ICD-10-CM

## 2019-06-24 DIAGNOSIS — F32A Depression, unspecified: Secondary | ICD-10-CM

## 2019-06-24 LAB — TSH: TSH: 1.49 u[IU]/mL (ref 0.35–4.50)

## 2019-06-24 LAB — VITAMIN B12: Vitamin B-12: 241 pg/mL (ref 211–911)

## 2019-06-24 LAB — TESTOSTERONE: Testosterone: 154.23 ng/dL — ABNORMAL LOW (ref 300.00–890.00)

## 2019-06-24 LAB — VITAMIN D 25 HYDROXY (VIT D DEFICIENCY, FRACTURES): VITD: 28.84 ng/mL — ABNORMAL LOW (ref 30.00–100.00)

## 2019-06-24 NOTE — Progress Notes (Signed)
Subjective:    Patient ID: Dale Davis, male    DOB: 11/06/1975, 44 y.o.   MRN: [id number]  HPI  Pt presents to the clinic today with c/o fatigue, mood changes and decreased libido. He noticed this 6 months ago after switching from Clonazepam to Ambien. He reports he sleeps well at night with the Ambien but does feel like he drags in the morning. He denies increase in anxiety and depression but reports he can tell his mood is "off". He denies SI/HI. He reports decreased fullness of his erection but he is able to initiate an erection, maintain it until completion. He is still attracted to his wife and he reports they have a good relationship, so he is not sure where the decreased libido is coming from. He is not exercising outside of work. He follows with Dr. Shea Evans.  Review of Systems      Past Medical History:  Diagnosis Date  . Anxiety   . Depression   . Kidney stones   . Kidney stones     Current Outpatient Medications  Medication Sig Dispense Refill  . Armodafinil 250 MG tablet Take 0.5 tablets (125 mg total) by mouth every morning. 15 tablet 2  . OLANZapine-FLUoxetine (SYMBYAX) 3-25 MG capsule Take 1 capsule by mouth every evening. 90 capsule 0  . propranolol (INDERAL) 10 MG tablet TAKE 1 TABLET (10 MG TOTAL) BY MOUTH 2 (TWO) TIMES DAILY AS NEEDED. FOR TREMORS 180 tablet 1  . zolpidem (AMBIEN) 5 MG tablet Take 1.5 tablets (7.5 mg total) by mouth at bedtime as needed for sleep. 45 tablet 0   No current facility-administered medications for this visit.    Allergies  Allergen Reactions  . Flomax [Tamsulosin Hcl] Rash    Family History  Problem Relation Age of Onset  . Kidney failure Father     Social History   Socioeconomic History  . Marital status: Married    Spouse name: [name]  . Number of children: 0  . Years of education: Not on file  . Highest education level: Not on file  Occupational History  . 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 Smoking status: Never Smoker  . Smokeless tobacco: Current User    Types: Chew  Substance and Sexual Activity  . Alcohol use: Yes    Alcohol/week: 1.0 standard drinks    Types: 1 Standard drinks or equivalent per week    Comment: occassional  . Drug use: No  . Sexual activity: Yes  Other Topics Concern  . Not on file  Social History Narrative  . Not on file   Social Determinants of Health   Financial Resource Strain: Low Risk   . Difficulty of Paying Living Expenses: Not hard at all  Food Insecurity: No Food Insecurity  . Worried About Charity fundraiser in the Last Year: Never true  . Ran Out of Food in the Last Year: Never true  Transportation Needs: No Transportation Needs  . Lack of Transportation (Medical): No  . Lack of Transportation (Non-Medical): No  Physical Activity: Insufficiently Active  . Days of Exercise per Week: 7 days  . Minutes of Exercise per Session: 20 min  Stress: No Stress Concern Present  . Feeling of Stress : Only a little  Social Connections: Somewhat Isolated  . Frequency of Communication with Friends and Family: Twice a week  . Frequency of Social Gatherings with Friends and Family: Twice a  week  . Attends Religious Services: Never  . Active Member of Clubs or Organizations: No  . Attends Archivist Meetings: Never  . Marital Status: Married  Human resources officer Violence: Not At Risk  . Fear of Current or Ex-Partner: No  . Emotionally Abused: No  . Physically Abused: No  . Sexually Abused: No     Constitutional: Pt reports fatigue. Denies fever, malaise, headache or abrupt weight changes.  Respiratory: Denies difficulty breathing, shortness of Davis, cough or sputum production.   Cardiovascular: Denies chest pain, chest tightness, palpitations or swelling in the hands or feet.  Gastrointestinal: Denies abdominal pain, bloating, constipation, diarrhea or blood in the stool.  GU: Pt reports decreased  libido. Denies urgency, frequency, pain with urination, burning sensation, blood in urine, odor or discharge. Neurological: Denies dizziness, difficulty with memory, difficulty with speech or problems with balance and coordination.  Psych: Pt has a history of anxiety, depression. Denies SI/HI.  No other specific complaints in a complete review of systems (except as listed in HPI above).  Objective:   Physical Exam BP 118/80   Pulse 84   Temp 98.3 F (36.8 C) (Temporal)   Wt 230 lb (104.3 kg)   SpO2 98%   BMI 31.63 kg/m   Wt Readings from Last 3 Encounters:  05/11/18 210 lb (95.3 kg)  06/12/15 216 lb 12.8 oz (98.3 kg)  05/12/15 202 lb 6.4 oz (91.8 kg)    General: Appears hisstated age, obese, in NAD. Neck:  Neck supple, trachea midline. No masses, lumps or thyromegaly present.  Cardiovascular: Normal rate and rhythm. S1,S2 noted.  No murmur, rubs or gallops noted.  Pulmonary/Chest: Normal effort and positive vesicular Davis sounds. No respiratory distress. No wheezes, rales or ronchi noted.  Neurological: Alert and oriented.  Psychiatric: Mood and affect normal. Behavior is normal. Judgment and thought content normal.     BMET    Component Value Date/Time   NA 141 07/09/2018 0836   K 5.0 07/09/2018 0836   CL 102 07/09/2018 0836   CO2 25 07/09/2018 0836   GLUCOSE 98 07/09/2018 0836   GLUCOSE 97 04/24/2015 1503   BUN 17 07/09/2018 0836   CREATININE 1.15 07/09/2018 0836   CALCIUM 9.9 07/09/2018 0836   GFRNONAA 77 07/09/2018 0836   GFRAA 89 07/09/2018 0836    Lipid Panel     Component Value Date/Time   CHOL 291 (H) 07/09/2018 0836   TRIG 130 07/09/2018 0836   HDL 47 07/09/2018 0836   LDLCALC 218 (H) 07/09/2018 0836    CBC    Component Value Date/Time   WBC 3.3 (L) 07/09/2018 0836   WBC 10.6 04/24/2015 1503   RBC 4.52 07/09/2018 0836   RBC 4.17 (L) 04/24/2015 1503   HGB 14.0 07/09/2018 0836   HCT 41.0 07/09/2018 0836   PLT 174 07/09/2018 0836   MCV 91  07/09/2018 0836   MCH 31.0 07/09/2018 0836   MCH 31.4 04/24/2015 1503   MCHC 34.1 07/09/2018 0836   MCHC 35.4 04/24/2015 1503   RDW 13.2 07/09/2018 0836   LYMPHSABS 0.8 07/09/2018 0836   MONOABS 0.8 04/18/2015 1930   EOSABS 0.1 07/09/2018 0836   BASOSABS 0.0 07/09/2018 0836    Hgb A1C Lab Results  Component Value Date   HGBA1C 5.3 07/09/2018            Assessment & Plan:  Fatigue, Insomnia, Anxiety and Depression, Bipolar, Mood Changes, Decreased Libido:  Will check testosterone, TSH, Vit D an B12  Will follow up after labs, return precautions discussed [name], NP This visit occurred during the SARS-CoV-2 public health emergency.  Safety protocols were in place, including screening questions prior to the visit, additional usage of staff PPE, and extensive cleaning of exam room while observing appropriate contact time as indicated for disinfecting solutions.

## 2019-06-24 NOTE — Patient Instructions (Signed)

## 2019-06-25 ENCOUNTER — Encounter: Payer: Self-pay | Admitting: Internal Medicine

## 2019-06-25 ENCOUNTER — Telehealth: Payer: Self-pay

## 2019-06-25 DIAGNOSIS — R7989 Other specified abnormal findings of blood chemistry: Secondary | ICD-10-CM

## 2019-06-25 DIAGNOSIS — R4586 Emotional lability: Secondary | ICD-10-CM

## 2019-06-25 DIAGNOSIS — R6882 Decreased libido: Secondary | ICD-10-CM

## 2019-06-25 DIAGNOSIS — R5383 Other fatigue: Secondary | ICD-10-CM

## 2019-06-25 NOTE — Telephone Encounter (Signed)
Pt saw lab results on mychart and pt request cb with how to proceed. Pt said Testosterone and Vit D low. [facility] ([address] in [place]).

## 2019-06-25 NOTE — Telephone Encounter (Signed)
Labs have not been interpreted yet, pt will be contacted once this has been done

## 2019-07-14 NOTE — Progress Notes (Signed)
07/15/19 9:18 AM   Dale Davis 04-21-1975 [id number]  Referring provider: Jearld Fenton, NP [address] [place],  [place] [address] Chief Complaint  Patient presents with  . Other    testosterone    HPI: Dale Davis is a 44 y.o. M who presents today for the evaluation and management of hypogonadism.  -decreased libido onset 1 year  -tired all the time -erections not like it used to be -no worsening of depression -on depressive medications  -Testosterone 154.23, 06/24/19 -7/10 positive responses ADAM questionnaire  -previous hx of kidney stones -passed stones in past  -no stone episodes since previous visit 2017  PMH: Past Medical History:  Diagnosis Date  . Anxiety   . Depression   . Kidney stones   . Kidney stones     Surgical History: Past Surgical History:  Procedure Laterality Date  . 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 If you have any questions, ask your nurse or doctor.        Armodafinil 250 MG tablet Take 0.5 tablets (125 mg total) by mouth every morning.   OLANZapine-FLUoxetine 3-25 MG capsule Commonly known as: Symbyax Take 1 capsule by mouth every evening.   zolpidem 5 MG tablet Commonly known as: AMBIEN Take 1.5 tablets (7.5 mg total) by mouth at bedtime as needed for sleep.       Allergies:  Allergies  Allergen Reactions  . Flomax [Tamsulosin Hcl] Rash    Family History: Family History  Problem Relation Age of Onset  . Kidney failure Father     Social History:  reports that he has never smoked. His smokeless tobacco use includes chew. He reports current alcohol use of about 1.0 standard drinks of alcohol per week. He reports that he does not use drugs.   Physical Exam: BP (!) 156/105   Pulse 64   Ht 5\' 11"  (1.803 m)   Wt 220 lb  (99.8 kg)   BMI 30.68 kg/m   Constitutional:  Alert and oriented, No acute distress. HEENT: Anna AT, moist mucus membranes.  Trachea midline, no masses. Cardiovascular: No clubbing, cyanosis, or edema. Respiratory: Normal respiratory effort, no increased work of breathing. GU: phallus without lesions, testes descended bilaterally without masses or tenderness; normal size.  Spermatic cord/epididymis palpably normal bilaterally Rectal: Tight anal canal, 30 g prostate w/ no nodules or tenderness  Skin: No rashes, bruises or suspicious lesions. Neurologic: Grossly intact, no focal deficits, moving all 4 extremities. Psychiatric: Normal mood and affect.  Laboratory Data:  Lab Results  Component Value Date   TESTOSTERONE 154.23 (L) 06/24/2019   Assessment & Plan:    1. Hypogonadism  Testosterone 154.23, 06/24/19  Blood work today for testosterone, LH, baseline PSA TRT options discussed including topicals, intramuscular and subcu injections  Potential side effects of testosterone replacement were discussed including stimulation of benign prostatic growth with lower urinary tract symptoms; erythrocytosis; edema; gynecomastia; worsening sleep apnea; venous thromboembolism; testicular atrophy and infertility. Recent studies suggesting an increased incidence of heart attack and stroke in patients taking testosterone was discussed. He was informed there is conflicting evidence regarding the impact of testosterone therapy on cardiovascular risk. The theoretical risk of growth stimulation of an undetected prostate cancer  was also discussed.  He was informed that current evidence does not provide any definitive answers regarding the risks of testosterone therapy on prostate cancer and cardiovascular disease. The need for periodic monitoring of his testosterone level, PSA, hematocrit and DRE was discussed.  Primarily interested in [facility] [address], [place], [place] [address] [phone]  I, Lucas Mallow, am acting as a scribe for Dr. Nicki Reaper C. Stoioff,  I have reviewed the above documentation for accuracy and completeness, and I agree with the above.   Abbie Sons, MD

## 2019-07-15 ENCOUNTER — Encounter: Payer: Self-pay | Admitting: Urology

## 2019-07-15 ENCOUNTER — Other Ambulatory Visit: Payer: Self-pay

## 2019-07-15 ENCOUNTER — Ambulatory Visit: Payer: Managed Care, Other (non HMO) | Admitting: Urology

## 2019-07-15 VITALS — BP 156/105 | HR 64 | Ht 71.0 in | Wt 220.0 lb

## 2019-07-15 DIAGNOSIS — E291 Testicular hypofunction: Secondary | ICD-10-CM | POA: Insufficient documentation

## 2019-07-15 NOTE — Addendum Note (Signed)
Addended by: Chrystie Nose on: 07/15/2019 10:49 AM   Modules accepted: Orders

## 2019-07-16 ENCOUNTER — Encounter: Payer: Self-pay | Admitting: Urology

## 2019-07-16 LAB — PROLACTIN: Prolactin: 6.5 ng/mL (ref 4.0–15.2)

## 2019-07-16 LAB — LUTEINIZING HORMONE: LH: 5.9 m[IU]/mL (ref 1.7–8.6)

## 2019-07-16 LAB — TESTOSTERONE: Testosterone: 272 ng/dL (ref 264–916)

## 2019-07-16 LAB — PSA: Prostate Specific Ag, Serum: 0.5 ng/mL (ref 0.0–4.0)

## 2019-07-19 ENCOUNTER — Telehealth: Payer: Self-pay | Admitting: *Deleted

## 2019-07-19 NOTE — Telephone Encounter (Signed)
-----   Message from Abbie Sons, MD sent at 07/19/2019  8:01 AM EDT ----- LH and prolactin were normal.  Repeat PSA was low normal.  Insurance will not cover unless there are 2 low testosterone levels.  Repeat free/total testosterone 1 week

## 2019-07-19 NOTE — Telephone Encounter (Signed)
Notified patient as instructed, patient pleased. Discussed follow-up appointments, patient agrees  

## 2019-07-20 ENCOUNTER — Other Ambulatory Visit: Payer: Self-pay

## 2019-07-20 ENCOUNTER — Telehealth (INDEPENDENT_AMBULATORY_CARE_PROVIDER_SITE_OTHER): Payer: Managed Care, Other (non HMO) | Admitting: Psychiatry

## 2019-07-20 ENCOUNTER — Encounter: Payer: Self-pay | Admitting: Psychiatry

## 2019-07-20 DIAGNOSIS — F1321 Sedative, hypnotic or anxiolytic dependence, in remission: Secondary | ICD-10-CM

## 2019-07-20 DIAGNOSIS — G4700 Insomnia, unspecified: Secondary | ICD-10-CM

## 2019-07-20 DIAGNOSIS — G4726 Circadian rhythm sleep disorder, shift work type: Secondary | ICD-10-CM

## 2019-07-20 DIAGNOSIS — F3178 Bipolar disorder, in full remission, most recent episode mixed: Secondary | ICD-10-CM | POA: Diagnosis not present

## 2019-07-20 DIAGNOSIS — R251 Tremor, unspecified: Secondary | ICD-10-CM

## 2019-07-20 MED ORDER — ZOLPIDEM TARTRATE 5 MG PO TABS: 7.5000 mg | ORAL_TABLET | Freq: Every evening | ORAL | 2 refills | Status: DC | PRN

## 2019-07-20 MED ORDER — ARMODAFINIL 150 MG PO TABS: 75.0000 mg | ORAL_TABLET | Freq: Every day | ORAL | 1 refills | Status: DC

## 2019-07-20 MED ORDER — OLANZAPINE-FLUOXETINE HCL 3-25 MG PO CAPS: 1.0000 | ORAL_CAPSULE | Freq: Every evening | ORAL | 0 refills | Status: DC

## 2019-07-20 NOTE — Progress Notes (Signed)
Provider Location : [facility] Patient Location : [facility] Visit via Video Note  I connected with Dale Davis on 07/20/19 at 11:30 AM EDT by a video enabled telemedicine application and verified that I am speaking with the correct person using two identifiers.   I discussed the limitations of evaluation and management by telemedicine and the availability of in person appointments. The patient expressed understanding and agreed to proceed.    I discussed the assessment and treatment plan with the patient. The patient was provided an opportunity to ask questions and all were answered. The patient agreed with the plan and demonstrated an understanding of the instructions.   The patient was advised to call back or seek an in-person evaluation if the symptoms worsen or if the condition fails to improve as anticipated.  [place] MD OP Progress Note  07/20/2019 12:02 PM Dale Davis  MRN:  [id number]  Chief Complaint:  Chief Complaint    Follow-up     HPI: Dale Davis is a 44 year old Caucasian male, married, employed, lives in [place], has a history of bipolar disorder, shiftwork disorder, benzodiazepine dependence, insomnia was evaluated by telemedicine today.  Patient today reports he is currently doing well with regards to his mood.  He is compliant on the Symbyax.  Tolerating it well.  He does have tremors which are chronic however it does not bother him much.  He reports he is able to function well at work.  He denies any anxiety symptoms.  He denies any suicidality, homicidality or perceptual disturbances.  He reports sleep is improving.  He reports the Ambien 7.5 mg made him too groggy and hence he is currently taking 5 mg most nights.  That does seem to work.  He is not following a good sleep hygiene.  He agrees to do so.  Patient reports he had an appointment with urology for low testosterone level and has upcoming appointment for management of the same.  Patient  reports he would like to gradually come off of the Nuvigil if possible.  He is hopeful that that may also help him with his tremors.  Patient denies any other concerns today.  Visit Diagnosis:    ICD-10-CM   1. Bipolar disorder, in full remission, most recent episode mixed (HCC)  F31.78 OLANZapine-FLUoxetine (SYMBYAX) 3-25 MG capsule  2. 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 Insomnia, unspecified type  G47.00 zolpidem (AMBIEN) 5 MG tablet  4. Benzodiazepine dependence in remission ([facility])  F13.21   5. Tremor  R25.1     Past Psychiatric History: I have reviewed past psychiatric history from my progress note on 06/26/2018.  Past trials of Symbyax, trazodone, Klonopin, Lunesta, Adderall extended release, Wellbutrin, Chantix, propranolol, Cogentin, Prozac, Zyprexa  Past Medical History:  Past Medical History:  Diagnosis Date  . Anxiety   . Depression   . Kidney stones   . Kidney stones     Past Surgical History:  Procedure Laterality Date  . 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 Kidney failure Father     Social History: I have reviewed social history from my progress note on 06/26/2018 Social History   Socioeconomic History  . Marital status: Married    Spouse name: [name]  . Number of children: 0  .  Years of education: Not on file  . Highest education level: Not on file  Occupational History  . 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 Smoking status: Never Smoker  . Smokeless tobacco: Current User    Types: Chew  Substance and Sexual Activity  . Alcohol use: Yes    Alcohol/week: 1.0 standard drinks    Types: 1 Standard drinks or equivalent per week    Comment: occassional  . Drug use: No  . Sexual activity: Yes  Other  Topics Concern  . Not on file  Social History Narrative  . Not on file   Social Determinants of Health   Financial Resource Strain:   . Difficulty of Paying Living Expenses:   Food Insecurity:   . Worried About Charity fundraiser in the Last Year:   . Arboriculturist in the Last Year:   Transportation Needs:   . Film/video editor (Medical):   Marland Kitchen Lack of Transportation (Non-Medical):   Physical Activity:   . Days of Exercise per Week:   . Minutes of Exercise per Session:   Stress:   . Feeling of Stress :   Social Connections:   . Frequency of Communication with Friends and Family:   . Frequency of Social Gatherings with Friends and Family:   . Attends Religious Services:   . Active Member of Clubs or Organizations:   . Attends Archivist Meetings:   Marland Kitchen Marital Status:     Allergies:  Allergies  Allergen Reactions  . Flomax [Tamsulosin Hcl] Rash    Metabolic Disorder Labs: Lab Results  Component Value Date   HGBA1C 5.3 07/09/2018   Lab Results  Component Value Date   PROLACTIN 6.5 07/15/2019   PROLACTIN 13.7 07/09/2018   Lab Results  Component Value Date   CHOL 291 (H) 07/09/2018   TRIG 130 07/09/2018   HDL 47 07/09/2018   LDLCALC 218 (H) 07/09/2018   Lab Results  Component Value Date   TSH 1.49 06/24/2019   TSH 1.830 07/09/2018    Therapeutic Level Labs: No results found for: LITHIUM No results found for: VALPROATE No components found for:  CBMZ  Current Medications: Current Outpatient Medications  Medication Sig Dispense Refill  . OLANZapine-FLUoxetine (SYMBYAX) 3-25 MG capsule Take 1 capsule by mouth every evening. 90 capsule 0  . zolpidem (AMBIEN) 5 MG tablet Take 1.5 tablets (7.5 mg total) by mouth at bedtime as needed for sleep. 45 tablet 2  . Armodafinil 150 MG tablet Take 0.5 tablets (75 mg total) by mouth daily. 7 tablet 1   No current facility-administered medications for this visit.     Musculoskeletal: Strength &  Muscle Tone: UTA Gait & Station: normal Patient leans: N/A  Psychiatric Specialty Exam: Review of Systems  Neurological: Positive for tremors (Chronic).  Psychiatric/Behavioral: Positive for sleep disturbance (Improving). Negative for agitation, behavioral problems, confusion, decreased concentration, dysphoric mood, hallucinations, self-injury and suicidal ideas. The patient is not nervous/anxious and is not hyperactive.   All other systems reviewed and are negative.   There were no vitals taken for this visit.There is no height or weight on file to calculate BMI.  General Appearance: Casual  Eye Contact:  Fair  Speech:  Clear and Coherent  Volume:  Normal  Mood:  Euthymic  Affect:  Congruent  Thought Process:  Goal Directed and Descriptions of Associations: Intact  Orientation:  Full (Time, Place, and Person)  Thought Content:  Logical   Suicidal Thoughts:  No  Homicidal Thoughts:  No  Memory:  Immediate;   Fair Recent;   Fair Remote;   Fair  Judgement:  Fair  Insight:  Fair  Psychomotor Activity:  Normal  Concentration:  Concentration: Fair and Attention Span: Fair  Recall:  [employer] of Knowledge: Fair  Language: Fair  Akathisia:  No  Handed:  Right  AIMS (if indicated): UTA  Assets:  Communication Skills Desire for Improvement Housing Intimacy Resilience Social Support Talents/Skills Transportation Vocational/Educational  ADL's:  Intact  Cognition: WNL  Sleep:  Improving   Screenings:   Assessment and Plan: Dale Davis is a 44 year old Caucasian male, married, employed, lives in [place], has a history of bipolar disorder, shiftwork disorder was evaluated by telemedicine today.  Patient is biologically predisposed given his history of mental health problems in his family.  Patient with psychosocial stressors of the pandemic as well as his own health issues.  Patient is currently struggling with low testosterone level which is currently being managed.   Patient will continue to benefit from medication readjustment.  Plan as noted below.  Plan Bipolar disorder in remission Symbyax 3-25 mg p.o. daily  Benzodiazepine dependence in early remission Will monitor closely  Shiftwork disorder-stable Reduce Nuvigil to 75 mg p.o. daily in the morning I have reviewed [place] controlled substance database. Patient advised to monitor his symptoms closely and let writer know if he does well on this dosage.  Tremors-chronic Propranolol 10 mg p.o. daily as needed Patient was referred to neurology-will continue to follow-up with neurology as needed  Insomnia-improving Ambien 5 to 7.5 mg p.o. nightly as needed Continue sleep hygiene techniques  Follow-up in clinic in 2 to 3 months or sooner if needed.  I have spent atleast 20 minutes non face to face with patient today. More than 50 % of the time was spent for preparing to see the patient ( e.g., review of test, records ),  ordering medications and test ,psychoeducation and supportive psychotherapy and care coordination,as well as documenting clinical information in electronic health record.This note was generated in part or whole with voice recognition software. Voice recognition is usually quite accurate but there are transcription errors that can and very often do occur. I apologize for any typographical errors that were not detected and corrected.        Ursula Alert, MD 07/20/2019, 12:02 PM

## 2019-07-22 ENCOUNTER — Other Ambulatory Visit: Payer: Self-pay | Admitting: Family Medicine

## 2019-07-22 DIAGNOSIS — E291 Testicular hypofunction: Secondary | ICD-10-CM

## 2019-07-27 ENCOUNTER — Other Ambulatory Visit: Payer: Managed Care, Other (non HMO)

## 2019-07-27 ENCOUNTER — Other Ambulatory Visit: Payer: Self-pay

## 2019-07-27 DIAGNOSIS — E291 Testicular hypofunction: Secondary | ICD-10-CM

## 2019-07-28 ENCOUNTER — Encounter: Payer: Self-pay | Admitting: Urology

## 2019-07-29 ENCOUNTER — Telehealth: Payer: Self-pay

## 2019-07-29 LAB — TESTOSTERONE,FREE AND TOTAL
Testosterone, Free: 5.3 pg/mL — ABNORMAL LOW (ref 6.8–21.5)
Testosterone: 235 ng/dL — ABNORMAL LOW (ref 264–916)

## 2019-07-29 NOTE — Telephone Encounter (Signed)
Patient called wanting test results and how to proceed. Please advise

## 2019-07-30 ENCOUNTER — Encounter: Payer: Self-pay | Admitting: Urology

## 2019-07-30 ENCOUNTER — Other Ambulatory Visit: Payer: Self-pay | Admitting: *Deleted

## 2019-07-30 MED ORDER — XYOSTED 75 MG/0.5ML ~~LOC~~ SOAJ
75.0000 mg | SUBCUTANEOUS | 3 refills | Status: DC
Start: 2019-07-30 — End: 2019-11-02

## 2019-07-30 NOTE — Telephone Encounter (Signed)
Notified patient as instructed, patient pleased. Discussed follow-up appointments, patient agrees He wants his testosterone sent to   [facility]

## 2019-07-30 NOTE — Telephone Encounter (Signed)
Repeat testosterone level was low at 235.  He had indicated he was interested in [facility].  I sent in an Rx to [facility] to see if it will be approved by insurance.

## 2019-08-02 NOTE — Telephone Encounter (Signed)
Notified patient as instructed, patient pleased. Dale Davis state is okay .

## 2019-08-18 ENCOUNTER — Other Ambulatory Visit: Payer: Self-pay | Admitting: Psychiatry

## 2019-08-18 DIAGNOSIS — G4726 Circadian rhythm sleep disorder, shift work type: Secondary | ICD-10-CM

## 2019-08-19 NOTE — Telephone Encounter (Signed)
I have sent his medication to pharmacy-armodafinil.

## 2019-09-21 ENCOUNTER — Encounter: Payer: Self-pay | Admitting: Internal Medicine

## 2019-09-22 ENCOUNTER — Encounter: Payer: Self-pay | Admitting: Internal Medicine

## 2019-09-22 ENCOUNTER — Other Ambulatory Visit: Payer: Self-pay

## 2019-09-22 ENCOUNTER — Telehealth (INDEPENDENT_AMBULATORY_CARE_PROVIDER_SITE_OTHER): Payer: Managed Care, Other (non HMO) | Admitting: Internal Medicine

## 2019-09-22 DIAGNOSIS — J069 Acute upper respiratory infection, unspecified: Secondary | ICD-10-CM

## 2019-09-22 MED ORDER — PREDNISONE 10 MG PO TABS: ORAL_TABLET | ORAL | 0 refills | Status: DC

## 2019-09-22 MED ORDER — HYDROCOD POLST-CPM POLST ER 10-8 MG/5ML PO SUER: 5.0000 mL | Freq: Every evening | ORAL | 0 refills | Status: DC | PRN

## 2019-09-22 NOTE — Progress Notes (Signed)
Virtual Visit via Video Note  I connected with Dale Davis on 09/22/19 at  4:15 PM EDT by a video enabled telemedicine application and verified that I am speaking with the correct person using two identifiers.  Location: Patient: Home Provider: office   I discussed the limitations of evaluation and management by telemedicine and the availability of in person appointments. The patient expressed understanding and agreed to proceed.  History of Present Illness:  Pt reports sinus pressure, runny nose, scratchy throat and cough. This started 3 days ago. He is blowing clear mucous out of his nose. He denies difficulty swallowing. The cough is nonproductive. He denies headache, dizziness, visual changes, eye pain, eye redness, nasal congestion, ear pain, loss of taste or smell, or SOB. He denies fever, chills or body aches. He has tried sinus relief and Flonase with some relief. He has not had sick contacts or exposure to Covid that he is aware of. He did not get a Covid vaccines.   Past Medical History:  Diagnosis Date  . Anxiety   . Depression   . Kidney stones   . Kidney stones     Current Outpatient Medications  Medication Sig Dispense Refill  . Armodafinil 150 MG tablet TAKE 1/2 TABLET (75 MG TOTAL) BY MOUTH DAILY. 15 tablet 2  . OLANZapine-FLUoxetine (SYMBYAX) 3-25 MG capsule Take 1 capsule by mouth every evening. 90 capsule 0  . Testosterone Enanthate (XYOSTED) 75 MG/0.5ML SOAJ Inject 75 mg into the skin once a week. 4 pen 3  . zolpidem (AMBIEN) 5 MG tablet Take 1.5 tablets (7.5 mg total) by mouth at bedtime as needed for sleep. 45 tablet 2   No current facility-administered medications for this visit.    Allergies  Allergen Reactions  . Flomax [Tamsulosin Hcl] Rash    Family History  Problem Relation Age of Onset  . Kidney failure Father     Social History   Socioeconomic History  . Marital status: Married    Spouse name: [name]  . Number of children: 0  . Years  of education: Not on file  . Highest education level: Not on file  Occupational History  . 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 Smoking status: Never Smoker  . Smokeless tobacco: Current User    Types: Chew  Vaping Use  . Vaping Use: Never used  Substance and Sexual Activity  . Alcohol use: Yes    Alcohol/week: 1.0 standard drink    Types: 1 Standard drinks or equivalent per week    Comment: occassional  . Drug use: No  . Sexual activity: Yes  Other Topics Concern  . Not on file  Social History Narrative  . Not on file   Social Determinants of Health   Financial Resource Strain:   . Difficulty of Paying Living Expenses:   Food Insecurity:   . Worried About Charity fundraiser in the Last Year:   . Arboriculturist in the Last Year:   Transportation Needs:   . Film/video editor (Medical):   Marland Kitchen Lack of Transportation (Non-Medical):   Physical Activity:   . Days of Exercise per Week:   . Minutes of Exercise per Session:   Stress:   . Feeling of Stress :   Social Connections:   . Frequency of Communication with Friends and Family:   . Frequency of Social Gatherings with Friends and Family:   . Attends Religious Services:   .  Active Member of Clubs or Organizations:   . Attends Archivist Meetings:   Marland Kitchen Marital Status:   Intimate Partner Violence:   . Fear of Current or Ex-Partner:   . Emotionally Abused:   Marland Kitchen Physically Abused:   . Sexually Abused:      Constitutional: Denies fever, malaise, fatigue, headache or abrupt weight changes.  HEENT: Pt reports sinus pressure, runny nose, scratchy throat. Denies eye pain, eye redness, ear pain, ringing in the ears, wax buildup, nasal congestion, bloody nose. Respiratory: Pt reports cough. Denies difficulty breathing, shortness of Davis, or sputum production.   Cardiovascular: Denies chest pain, chest tightness, palpitations or swelling in the hands or feet.    No other specific complaints in a complete review of systems (except as listed in HPI above).  Observations/Objective:  Wt Readings from Last 3 Encounters:  07/15/19 220 lb (99.8 kg)  06/24/19 230 lb (104.3 kg)  05/11/18 210 lb (95.3 kg)    General: Appears his stated age, obese, in NAD. HEENT: Head: normal shape and size; Nose:no congestion noted; Throat/Mouth: hoarseness noted.  Pulmonary/Chest: Normal effort. Dry cough noted. No respiratory distress.  Neurological: Alert and oriented.   BMET    Component Value Date/Time   NA 141 07/09/2018 0836   K 5.0 07/09/2018 0836   CL 102 07/09/2018 0836   CO2 25 07/09/2018 0836   GLUCOSE 98 07/09/2018 0836   GLUCOSE 97 04/24/2015 1503   BUN 17 07/09/2018 0836   CREATININE 1.15 07/09/2018 0836   CALCIUM 9.9 07/09/2018 0836   GFRNONAA 77 07/09/2018 0836   GFRAA 89 07/09/2018 0836    Lipid Panel     Component Value Date/Time   CHOL 291 (H) 07/09/2018 0836   TRIG 130 07/09/2018 0836   HDL 47 07/09/2018 0836   LDLCALC 218 (H) 07/09/2018 0836    CBC    Component Value Date/Time   WBC 3.3 (L) 07/09/2018 0836   WBC 10.6 04/24/2015 1503   RBC 4.52 07/09/2018 0836   RBC 4.17 (L) 04/24/2015 1503   HGB 14.0 07/09/2018 0836   HCT 41.0 07/09/2018 0836   PLT 174 07/09/2018 0836   MCV 91 07/09/2018 0836   MCH 31.0 07/09/2018 0836   MCH 31.4 04/24/2015 1503   MCHC 34.1 07/09/2018 0836   MCHC 35.4 04/24/2015 1503   RDW 13.2 07/09/2018 0836   LYMPHSABS 0.8 07/09/2018 0836   MONOABS 0.8 04/18/2015 1930   EOSABS 0.1 07/09/2018 0836   BASOSABS 0.0 07/09/2018 0836    Hgb A1C Lab Results  Component Value Date   HGBA1C 5.3 07/09/2018        Assessment and Plan:  Viral URI with Cough:  Advised him abx not indicated at this time RX for Pred Taper x 6 days RX for Tussionex cough syrup Continue Flonase OTC  Advised him if not better by Monday, would try Zpack  Follow Up Instructions:    I discussed the assessment  and treatment plan with the patient. The patient was provided an opportunity to ask questions and all were answered. The patient agreed with the plan and demonstrated an understanding of the instructions.   The patient was advised to call back or seek an in-person evaluation if the symptoms worsen or if the condition fails to improve as anticipated.    [name], NP

## 2019-09-22 NOTE — Patient Instructions (Signed)

## 2019-09-30 ENCOUNTER — Other Ambulatory Visit: Payer: Self-pay

## 2019-09-30 ENCOUNTER — Telehealth (INDEPENDENT_AMBULATORY_CARE_PROVIDER_SITE_OTHER): Payer: Managed Care, Other (non HMO) | Admitting: Psychiatry

## 2019-09-30 ENCOUNTER — Encounter: Payer: Self-pay | Admitting: Psychiatry

## 2019-09-30 DIAGNOSIS — F3178 Bipolar disorder, in full remission, most recent episode mixed: Secondary | ICD-10-CM | POA: Diagnosis not present

## 2019-09-30 DIAGNOSIS — G4726 Circadian rhythm sleep disorder, shift work type: Secondary | ICD-10-CM | POA: Diagnosis not present

## 2019-09-30 DIAGNOSIS — F5101 Primary insomnia: Secondary | ICD-10-CM | POA: Diagnosis not present

## 2019-09-30 DIAGNOSIS — G4700 Insomnia, unspecified: Secondary | ICD-10-CM

## 2019-09-30 DIAGNOSIS — F1321 Sedative, hypnotic or anxiolytic dependence, in remission: Secondary | ICD-10-CM | POA: Diagnosis not present

## 2019-09-30 DIAGNOSIS — R251 Tremor, unspecified: Secondary | ICD-10-CM

## 2019-09-30 MED ORDER — ARMODAFINIL 150 MG PO TABS: ORAL_TABLET | ORAL | 2 refills | Status: DC

## 2019-09-30 MED ORDER — OLANZAPINE-FLUOXETINE HCL 3-25 MG PO CAPS: 1.0000 | ORAL_CAPSULE | Freq: Every evening | ORAL | 1 refills | Status: DC

## 2019-09-30 MED ORDER — ZOLPIDEM TARTRATE 5 MG PO TABS: 5.0000 mg | ORAL_TABLET | Freq: Every evening | ORAL | 2 refills | Status: DC | PRN

## 2019-09-30 NOTE — Progress Notes (Signed)
Provider Location : [facility] Patient Location : [place]  Virtual Visit via Video Note  I connected with Dale Davis on 09/30/19 at 11:30 AM EDT by a video enabled telemedicine application and verified that I am speaking with the correct person using two identifiers.   I discussed the limitations of evaluation and management by telemedicine and the availability of in person appointments. The patient expressed understanding and agreed to proceed.    I discussed the assessment and treatment plan with the patient. The patient was provided an opportunity to ask questions and all were answered. The patient agreed with the plan and demonstrated an understanding of the instructions.   The patient was advised to call back or seek an in-person evaluation if the symptoms worsen or if the condition fails to improve as anticipated.  [place] MD OP Progress Note  09/30/2019 12:40 PM Dale Davis  MRN:  [id number]  Chief Complaint:  Chief Complaint    Follow-up     HPI: Dale Davis is a 44 year old Caucasian male, married, employed, lives in [place], has a history of bipolar disorder, shiftwork disorder, benzodiazepine dependence, insomnia was evaluated by telemedicine today.  Patient today reports he is currently stable on current medication regimen.  He continues to be compliant on his medications like Symbyax and Nuvigil.  He denies any side effects.  He reports he is currently tolerating the lower dosage of Nuvigil.  He is currently taking testosterone replacement and that also seems to be helpful.  He reports he has noticed a difference in his energy level and sex drive.  He reports he is currently taking only 1 tablet of the Ambien as opposed to 1-1/2 tablet.  He is able to sleep well.  Patient denies any suicidality, homicidality or perceptual disturbances.  Patient denies any other concerns today.  Visit Diagnosis:    ICD-10-CM   1. Bipolar disorder, in full remission, most  recent episode mixed (HCC)  F31.78 OLANZapine-FLUoxetine (SYMBYAX) 3-25 MG capsule  2. 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 Primary insomnia  F51.01   4. Benzodiazepine dependence in remission ([place])  F13.21   5. Tremor  R25.1     Past Psychiatric History: I have reviewed past psychiatric history from my progress note on 06/26/2018.  Past trials of Symbyax, trazodone, Klonopin, Lunesta, Adderall extended release, Wellbutrin, Chantix, propranolol, Cogentin, Prozac, Zyprexa  Past Medical History:  Past Medical History:  Diagnosis Date  . Anxiety   . Depression   . Kidney stones   . Kidney stones     Past Surgical History:  Procedure Laterality Date  . 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 Kidney failure Father     Social History: I have reviewed social history from my progress note on 06/26/2018 Social History   Socioeconomic History  . Marital status: Married    Spouse name: [name]  . Number of children: 0  . Years of education: Not on file  . Highest education level: Not on file  Occupational History  . 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 Smoking status: Never Smoker  . Smokeless tobacco: Current User    Types: Chew  Vaping Use  . Vaping Use: Never used  Substance and Sexual  Activity  . Alcohol use: Yes    Alcohol/week: 1.0 standard drink    Types: 1 Standard drinks or equivalent per week    Comment: occassional  . Drug use: No  . Sexual activity: Yes  Other Topics Concern  . Not on file  Social History Narrative  . Not on file   Social Determinants of Health   Financial Resource Strain:   . Difficulty of Paying Living Expenses:   Food Insecurity:   . Worried About Charity fundraiser in the Last Year:    . Arboriculturist in the Last Year:   Transportation Needs:   . Film/video editor (Medical):   Marland Kitchen Lack of Transportation (Non-Medical):   Physical Activity:   . Days of Exercise per Week:   . Minutes of Exercise per Session:   Stress:   . Feeling of Stress :   Social Connections:   . Frequency of Communication with Friends and Family:   . Frequency of Social Gatherings with Friends and Family:   . Attends Religious Services:   . Active Member of Clubs or Organizations:   . Attends Archivist Meetings:   Marland Kitchen Marital Status:     Allergies:  Allergies  Allergen Reactions  . Flomax [Tamsulosin Hcl] Rash    Metabolic Disorder Labs: Lab Results  Component Value Date   HGBA1C 5.3 07/09/2018   Lab Results  Component Value Date   PROLACTIN 6.5 07/15/2019   PROLACTIN 13.7 07/09/2018   Lab Results  Component Value Date   CHOL 291 (H) 07/09/2018   TRIG 130 07/09/2018   HDL 47 07/09/2018   LDLCALC 218 (H) 07/09/2018   Lab Results  Component Value Date   TSH 1.49 06/24/2019   TSH 1.830 07/09/2018    Therapeutic Level Labs: No results found for: LITHIUM No results found for: VALPROATE No components found for:  CBMZ  Current Medications: Current Outpatient Medications  Medication Sig Dispense Refill  . [START ON 10/20/2019] Armodafinil 150 MG tablet TAKE 1/2 TABLET (75 MG TOTAL) BY MOUTH DAILY. 15 tablet 2  . chlorpheniramine-HYDROcodone (TUSSIONEX PENNKINETIC ER) 10-8 MG/5ML SUER Take 5 mLs by mouth at bedtime as needed. 140 mL 0  . OLANZapine-FLUoxetine (SYMBYAX) 3-25 MG capsule Take 1 capsule by mouth every evening. 90 capsule 1  . predniSONE (DELTASONE) 10 MG tablet Take 6 tabs day 1, 5 tabs day 2, 4 tabs day 3, 3 tabs day 4, 2 tabs day 5, 1 tab day 6 21 tablet 0  . Testosterone Enanthate (XYOSTED) 75 MG/0.5ML SOAJ Inject 75 mg into the skin once a week. 4 pen 3  . zolpidem (AMBIEN) 5 MG tablet Take 1.5 tablets (7.5 mg total) by mouth at bedtime as needed  for sleep. 45 tablet 2   No current facility-administered medications for this visit.     Musculoskeletal: Strength & Muscle Tone: UTA Gait & Station: normal Patient leans: N/A  Psychiatric Specialty Exam: Review of Systems  Psychiatric/Behavioral: Negative for agitation, behavioral problems, confusion, decreased concentration, dysphoric mood, hallucinations, self-injury, sleep disturbance and suicidal ideas. The patient is not nervous/anxious and is not hyperactive.   All other systems reviewed and are negative.   There were no vitals taken for this visit.There is no height or weight on file to calculate BMI.  General Appearance: Casual  Eye Contact:  Fair  Speech:  Clear and Coherent  Volume:  Normal  Mood:  Euthymic  Affect:  Congruent  Thought Process:  Goal Directed  and Descriptions of Associations: Intact  Orientation:  Full (Time, Place, and Person)  Thought Content: Logical   Suicidal Thoughts:  No  Homicidal Thoughts:  No  Memory:  Immediate;   Fair Recent;   Fair Remote;   Fair  Judgement:  Fair  Insight:  Fair  Psychomotor Activity:  Normal  Concentration:  Concentration: Fair and Attention Span: Fair  Recall:  [employer] of Knowledge: Fair  Language: Fair  Akathisia:  No  Handed:  Right  AIMS (if indicated): UTA  Assets:  Communication Skills Desire for Improvement Housing Social Support  ADL's:  Intact  Cognition: WNL  Sleep:  Fair   Screenings:   Assessment and Plan: Dale Davis is a 44 year old Caucasian male, married, employed, lives in [place], has a history of bipolar disorder, shiftwork disorder was evaluated by telemedicine today.  Patient is biologically predisposed given his history of mental health problems and family.  Patient with psychosocial stressors of the pandemic as well as his own health issues.  Patient is currently stable on current medication regimen.  Plan as noted below.  Plan Bipolar disorder in remission Symbyax 3-25  mg p.o. daily  Benzodiazepine dependence in early remission We will monitor closely  Shiftwork disorder-stable Nuvigil 75 mg p.o. daily in the morning I have reviewed [place] controlled substance database.  Tremors chronic Propranolol 10 mg p.o. daily as needed Patient was referred for neurology evaluation-we will continue to follow-up with neurology as needed  Insomnia-stable Ambien 5 mg p.o. nightly as needed I have reviewed [place] controlled substance database.  Patient with history of elevated lipid panel, discussed with patient the need for repeating labs like lipid panel, hemoglobin A1c since he is on Symbyax.  He has upcoming appointment with his provider for the same.  Follow-up in clinic in 3 to 4 months or sooner if needed.  I have spent atleast 20 minutes non face to face with patient today. More than 50 % of the time was spent for preparing to see the patient ( e.g., review of test, records ), obtaining and to review and separately obtained history , ordering medications and test ,psychoeducation and supportive psychotherapy and care coordination,as well as documenting clinical information in electronic health record. This note was generated in part or whole with voice recognition software. Voice recognition is usually quite accurate but there are transcription errors that can and very often do occur. I apologize for any typographical errors that were not detected and corrected.       Ursula Alert, MD 09/30/2019, 12:40 PM

## 2019-10-21 ENCOUNTER — Telehealth: Payer: Self-pay

## 2019-10-21 NOTE — Telephone Encounter (Signed)
prior Josem Kaufmann is needed for the armodafinil 150mg 

## 2019-10-21 NOTE — Telephone Encounter (Signed)
went online to [email or web address] and submitted the prior auth for the armodafinil 150mg - pending

## 2019-10-29 NOTE — Telephone Encounter (Signed)
armodafinil 150mg  was approved until 04-22-2020

## 2019-11-01 ENCOUNTER — Encounter: Payer: Self-pay | Admitting: Urology

## 2019-11-02 ENCOUNTER — Telehealth: Payer: Self-pay

## 2019-11-02 ENCOUNTER — Other Ambulatory Visit: Payer: Self-pay | Admitting: *Deleted

## 2019-11-02 DIAGNOSIS — G4726 Circadian rhythm sleep disorder, shift work type: Secondary | ICD-10-CM

## 2019-11-02 MED ORDER — ARMODAFINIL 150 MG PO TABS: 150.0000 mg | ORAL_TABLET | Freq: Every day | ORAL | 2 refills | Status: DC

## 2019-11-02 NOTE — Telephone Encounter (Signed)
pt called left message states that he believes that he needs a dosage increase.

## 2019-11-02 NOTE — Telephone Encounter (Signed)
Returned call to patient.  He reports he is having trouble waking up in the morning like he used to in the past.  He hence wanted to go back on the Nuvigil 150 mg which helped him in the past.  He is currently taking 75 mg.  He reports the Ambien 5 mg does help him to sleep through the night.  Will increase the Nuvigil to 150 mg p.o. daily

## 2019-11-08 MED ORDER — XYOSTED 75 MG/0.5ML ~~LOC~~ SOAJ: 75.0000 mg | SUBCUTANEOUS | 3 refills | Status: DC

## 2019-11-09 ENCOUNTER — Encounter: Payer: Self-pay | Admitting: Internal Medicine

## 2019-11-09 ENCOUNTER — Other Ambulatory Visit: Payer: Self-pay | Admitting: *Deleted

## 2019-11-09 MED ORDER — XYOSTED 75 MG/0.5ML ~~LOC~~ SOAJ: 75.0000 mg | SUBCUTANEOUS | 3 refills | Status: DC

## 2019-11-11 NOTE — Telephone Encounter (Signed)
Form completed and signed and placed in MYD box

## 2019-12-09 ENCOUNTER — Telehealth: Payer: Self-pay

## 2019-12-09 DIAGNOSIS — G4700 Insomnia, unspecified: Secondary | ICD-10-CM

## 2019-12-09 MED ORDER — ZOLPIDEM TARTRATE 5 MG PO TABS: 5.0000 mg | ORAL_TABLET | Freq: Every evening | ORAL | 2 refills | Status: DC | PRN

## 2019-12-09 NOTE — Telephone Encounter (Signed)
pt needs refill on [place]

## 2019-12-09 NOTE — Telephone Encounter (Signed)
I have sent Ambien to pharmacy.

## 2019-12-13 ENCOUNTER — Other Ambulatory Visit: Payer: Self-pay

## 2019-12-13 ENCOUNTER — Ambulatory Visit: Payer: Managed Care, Other (non HMO) | Admitting: Internal Medicine

## 2019-12-13 ENCOUNTER — Encounter: Payer: Self-pay | Admitting: Internal Medicine

## 2019-12-13 VITALS — BP 140/88 | HR 83 | Temp 98.3°F | Wt 237.0 lb

## 2019-12-13 DIAGNOSIS — R109 Unspecified abdominal pain: Secondary | ICD-10-CM | POA: Diagnosis not present

## 2019-12-13 DIAGNOSIS — M545 Low back pain, unspecified: Secondary | ICD-10-CM

## 2019-12-13 LAB — POC URINALSYSI DIPSTICK (AUTOMATED)
Bilirubin, UA: NEGATIVE
Blood, UA: NEGATIVE
Glucose, UA: NEGATIVE
Ketones, UA: NEGATIVE
Leukocytes, UA: NEGATIVE
Nitrite, UA: NEGATIVE
Protein, UA: NEGATIVE
Spec Grav, UA: >=1.03 — AB (ref 1.010–1.025)
Urobilinogen, UA: 0.2 E.U./dL
pH, UA: 6 (ref 5.0–8.0)

## 2019-12-13 MED ORDER — KETOROLAC TROMETHAMINE 30 MG/ML IJ SOLN
30.0000 mg | Freq: Once | INTRAMUSCULAR | Status: AC
  Administered 2019-12-13: 30 mg via INTRAMUSCULAR

## 2019-12-13 MED ORDER — CYCLOBENZAPRINE HCL 10 MG PO TABS: 10.0000 mg | ORAL_TABLET | Freq: Three times a day (TID) | ORAL | 0 refills | Status: DC | PRN

## 2019-12-13 MED ORDER — HYDROCODONE-ACETAMINOPHEN 5-325 MG PO TABS: 1.0000 | ORAL_TABLET | Freq: Three times a day (TID) | ORAL | 0 refills | Status: DC | PRN

## 2019-12-13 NOTE — Progress Notes (Signed)
Subjective:    Patient ID: Dale Davis, male    DOB: Apr 10, 1975, 44 y.o.   MRN: [id number]  HPI  Pt presents to the clinic today with c/o left low back pain. He reports this started 2 weeks ago. He describes the pain as sore and sharp. The pain does not radiate. The pain is worse with position changes and movement. The area is not tender to touch. He denies numbness, tingling or weakness in his left leg. He denies loss of bowel or bladder control. He denies urinary or GI symptoms. He denies any injury to the area. He has taken Ibuprofen OTC without any relief. He has a history of kidney stones but reports this does not feel the same.   Review of Systems      Past Medical History:  Diagnosis Date  . Anxiety   . Depression   . Kidney stones   . Kidney stones     Current Outpatient Medications  Medication Sig Dispense Refill  . Armodafinil 150 MG tablet Take 1 tablet (150 mg total) by mouth daily. 30 tablet 2  . chlorpheniramine-HYDROcodone (TUSSIONEX PENNKINETIC ER) 10-8 MG/5ML SUER Take 5 mLs by mouth at bedtime as needed. 140 mL 0  . OLANZapine-FLUoxetine (SYMBYAX) 3-25 MG capsule Take 1 capsule by mouth every evening. 90 capsule 1  . predniSONE (DELTASONE) 10 MG tablet Take 6 tabs day 1, 5 tabs day 2, 4 tabs day 3, 3 tabs day 4, 2 tabs day 5, 1 tab day 6 21 tablet 0  . Testosterone Enanthate (XYOSTED) 75 MG/0.5ML SOAJ Inject 75 mg into the skin once a week. 2 mL 3  . zolpidem (AMBIEN) 5 MG tablet Take 1 tablet (5 mg total) by mouth at bedtime as needed for sleep. 30 tablet 2   No current facility-administered medications for this visit.    Allergies  Allergen Reactions  . Flomax [Tamsulosin Hcl] Rash    Family History  Problem Relation Age of Onset  . Kidney failure Father     Social History   Socioeconomic History  . Marital status: Married    Spouse name: [name]  . Number of children: 0  . Years of education: Not on file  . Highest education level: Not on  file  Occupational History  . 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 Smoking status: Never Smoker  . Smokeless tobacco: Current User    Types: Chew  Vaping Use  . Vaping Use: Never used  Substance and Sexual Activity  . Alcohol use: Yes    Alcohol/week: 1.0 standard drink    Types: 1 Standard drinks or equivalent per week    Comment: occassional  . Drug use: No  . Sexual activity: Yes  Other Topics Concern  . Not on file  Social History Narrative  . Not on file   Social Determinants of Health   Financial Resource Strain:   . Difficulty of Paying Living Expenses: Not on file  Food Insecurity:   . Worried About Charity fundraiser in the Last Year: Not on file  . Ran Out of Food in the Last Year: Not on file  Transportation Needs:   . Lack of Transportation (Medical): Not on file  . Lack of Transportation (Non-Medical): Not on file  Physical Activity:   . Days of Exercise per Week: Not on file  . Minutes of Exercise per Session: Not on file  Stress:   .  Feeling of Stress : Not on file  Social Connections:   . Frequency of Communication with Friends and Family: Not on file  . Frequency of Social Gatherings with Friends and Family: Not on file  . Attends Religious Services: Not on file  . Active Member of Clubs or Organizations: Not on file  . Attends Archivist Meetings: Not on file  . Marital Status: Not on file  Intimate Partner Violence:   . Fear of Current or Ex-Partner: Not on file  . Emotionally Abused: Not on file  . Physically Abused: Not on file  . Sexually Abused: Not on file     Constitutional: Denies fever, malaise, fatigue, headache or abrupt weight changes.  Respiratory: Denies difficulty breathing, shortness of Davis, cough or sputum production.   Cardiovascular: Denies chest pain, chest tightness, palpitations or swelling in the hands or feet.  Gastrointestinal: Denies abdominal pain,  bloating, constipation, diarrhea or blood in the stool.  GU: Pt reports left flank pain. Denies urgency, frequency, pain with urination, burning sensation, blood in urine, odor or discharge. Musculoskeletal: Pt reports left low back pain. Denies decrease in range of motion, difficulty with gait, or joint swelling.  Skin: Denies redness, rashes, lesions or ulcercations.  Neurological: Denies numbness, tingling, weakness or problems with balance and coordination.    No other specific complaints in a complete review of systems (except as listed in HPI above).  Objective:   Physical Exam  BP 140/88   Pulse 83   Temp 98.3 F (36.8 C) (Temporal)   Wt 237 lb (107.5 kg)   SpO2 96%   BMI 33.05 kg/m   Wt Readings from Last 3 Encounters:  07/15/19 220 lb (99.8 kg)  06/24/19 230 lb (104.3 kg)  05/11/18 210 lb (95.3 kg)    General: Appears his stated age, obese, in NAD. Skin: Warm, dry and intact. No rashes noted. HEENT: Head: normal shape and size; Eyes: sclera white, no icterus, conjunctiva pink, PERRLA and EOMs intact;  Cardiovascular: Normal rate and rhythm. S1,S2 noted.  No murmur, rubs or gallops noted.  Pulmonary/Chest: Normal effort and positive vesicular Davis sounds. No respiratory distress. No wheezes, rales or ronchi noted.  Abdomen: Soft and nontender. Normal bowel sounds. No distention or masses noted. + CVA tenderness noted on the left. Musculoskeletal: Normal flexion, rotation and lateral bending to the right. Decreased extension and lateral bending to the left. No pain with palpation over the spine. No pain with palpation over the left para thoracic/lumbar region. Strength 4/5 LLE, 5/5 RLE. Difficulty getting from sitting to a standing position.. Limping gait.  Neurological: Alert and oriented. Negative SLR on the left.   BMET    Component Value Date/Time   NA 141 07/09/2018 0836   K 5.0 07/09/2018 0836   CL 102 07/09/2018 0836   CO2 25 07/09/2018 0836   GLUCOSE 98  07/09/2018 0836   GLUCOSE 97 04/24/2015 1503   BUN 17 07/09/2018 0836   CREATININE 1.15 07/09/2018 0836   CALCIUM 9.9 07/09/2018 0836   GFRNONAA 77 07/09/2018 0836   GFRAA 89 07/09/2018 0836    Lipid Panel     Component Value Date/Time   CHOL 291 (H) 07/09/2018 0836   TRIG 130 07/09/2018 0836   HDL 47 07/09/2018 0836   LDLCALC 218 (H) 07/09/2018 0836    CBC    Component Value Date/Time   WBC 3.3 (L) 07/09/2018 0836   WBC 10.6 04/24/2015 1503   RBC 4.52 07/09/2018 0836  RBC 4.17 (L) 04/24/2015 1503   HGB 14.0 07/09/2018 0836   HCT 41.0 07/09/2018 0836   PLT 174 07/09/2018 0836   MCV 91 07/09/2018 0836   MCH 31.0 07/09/2018 0836   MCH 31.4 04/24/2015 1503   MCHC 34.1 07/09/2018 0836   MCHC 35.4 04/24/2015 1503   RDW 13.2 07/09/2018 0836   LYMPHSABS 0.8 07/09/2018 0836   MONOABS 0.8 04/18/2015 1930   EOSABS 0.1 07/09/2018 0836   BASOSABS 0.0 07/09/2018 0836    Hgb A1C Lab Results  Component Value Date   HGBA1C 5.3 07/09/2018            Assessment & Plan:   Left Flank Pain, Left Low Back Pain:  Urinalysis: normal Will hold off on urine culture/CT scan 30 mg Toradol IM today RX for Flexeril 10 mg TID prn-sedation caution given RX for Hydrocodone 5-325 mg TID prn- addiction caution given Encouraged heat, massage and stretching If worse, consider CT renal stone study  Return precautions discussed  [name], NP This visit occurred during the SARS-CoV-2 public health emergency.  Safety protocols were in place, including screening questions prior to the visit, additional usage of staff PPE, and extensive cleaning of exam room while observing appropriate contact time as indicated for disinfecting solutions.

## 2019-12-13 NOTE — Addendum Note (Signed)
Addended by: Lurlean Nanny on: 12/13/2019 05:14 PM   Modules accepted: Orders

## 2019-12-13 NOTE — Patient Instructions (Signed)

## 2019-12-15 ENCOUNTER — Encounter: Payer: Self-pay | Admitting: Internal Medicine

## 2019-12-15 ENCOUNTER — Other Ambulatory Visit: Payer: Self-pay

## 2019-12-15 ENCOUNTER — Other Ambulatory Visit (INDEPENDENT_AMBULATORY_CARE_PROVIDER_SITE_OTHER): Payer: Managed Care, Other (non HMO)

## 2019-12-15 DIAGNOSIS — R109 Unspecified abdominal pain: Secondary | ICD-10-CM

## 2019-12-15 LAB — BASIC METABOLIC PANEL
BUN: 15 mg/dL (ref 6–23)
CO2: 32 mEq/L (ref 19–32)
Calcium: 9.1 mg/dL (ref 8.4–10.5)
Chloride: 99 mEq/L (ref 96–112)
Creatinine, Ser: 1.08 mg/dL (ref 0.40–1.50)
GFR: 82.16 mL/min (ref >60.00)
Glucose, Bld: 82 mg/dL (ref 70–99)
Potassium: 3.9 mEq/L (ref 3.5–5.1)
Sodium: 137 mEq/L (ref 135–145)

## 2019-12-16 ENCOUNTER — Ambulatory Visit
Admission: RE | Admit: 2019-12-16 | Discharge: 2019-12-16 | Disposition: A | Discharge status: Home / Self Care | Payer: Managed Care, Other (non HMO) | Source: Ambulatory Visit | Attending: Internal Medicine | Admitting: Internal Medicine

## 2019-12-16 DIAGNOSIS — R109 Unspecified abdominal pain: Secondary | ICD-10-CM | POA: Insufficient documentation

## 2020-01-07 ENCOUNTER — Other Ambulatory Visit: Payer: Self-pay | Admitting: *Deleted

## 2020-01-07 DIAGNOSIS — E291 Testicular hypofunction: Secondary | ICD-10-CM

## 2020-01-10 ENCOUNTER — Other Ambulatory Visit: Payer: Managed Care, Other (non HMO)

## 2020-01-17 ENCOUNTER — Other Ambulatory Visit: Payer: Self-pay | Admitting: Family Medicine

## 2020-01-19 MED ORDER — XYOSTED 75 MG/0.5ML ~~LOC~~ SOAJ: 75.0000 mg | SUBCUTANEOUS | 3 refills | Status: DC

## 2020-01-27 ENCOUNTER — Encounter: Payer: Self-pay | Admitting: Psychiatry

## 2020-01-27 ENCOUNTER — Telehealth (INDEPENDENT_AMBULATORY_CARE_PROVIDER_SITE_OTHER): Payer: 59 | Admitting: Psychiatry

## 2020-01-27 ENCOUNTER — Other Ambulatory Visit: Payer: Self-pay

## 2020-01-27 DIAGNOSIS — F1321 Sedative, hypnotic or anxiolytic dependence, in remission: Secondary | ICD-10-CM

## 2020-01-27 DIAGNOSIS — F5101 Primary insomnia: Secondary | ICD-10-CM

## 2020-01-27 DIAGNOSIS — G4726 Circadian rhythm sleep disorder, shift work type: Secondary | ICD-10-CM

## 2020-01-27 DIAGNOSIS — F3178 Bipolar disorder, in full remission, most recent episode mixed: Secondary | ICD-10-CM

## 2020-01-27 MED ORDER — ARMODAFINIL 150 MG PO TABS: 150.0000 mg | ORAL_TABLET | Freq: Every day | ORAL | 2 refills | Status: DC

## 2020-01-27 NOTE — Progress Notes (Signed)
Virtual Visit via Video Note  I connected with Dale Davis on 01/27/20 at 10:30 AM EST by a video enabled telemedicine application and verified that I am speaking with the correct person using two identifiers.  Location Provider Location : [facility] Patient Location : Car  Participants: Patient , Provider   I discussed the limitations of evaluation and management by telemedicine and the availability of in person appointments. The patient expressed understanding and agreed to proceed.   I discussed the assessment and treatment plan with the patient. The patient was provided an opportunity to ask questions and all were answered. The patient agreed with the plan and demonstrated an understanding of the instructions.   The patient was advised to call back or seek an in-person evaluation if the symptoms worsen or if the condition fails to improve as anticipated.  Dale Davis OP Progress Note  01/27/2020 10:56 AM Dale Davis  MRN:  [id number]  Chief Complaint:  Chief Complaint    Follow-up     HPI: Dale Davis is a 44 year old Caucasian male, married, employed, lives in [place], has a history of bipolar disorder, shiftwork disorder, benzodiazepine dependence, insomnia was evaluated by telemedicine today.  Patient today reports he is currently doing well.  Denies any mood swings.  Denies any anxiety attacks.  He reports sleep as good.  He reports his appetite is fair.  He denies any suicidality, homicidality or perceptual disturbances.  Patient reports he had a good Thanksgiving holiday.  He reports work is going well.  Patient is compliant on medications as prescribed.  Denies side effects.  He continues to have problems which are chronic for which he was referred to neurology in the past.  His tremors does not bother him much.  Patient denies any other concerns today.  Visit Diagnosis:    ICD-10-CM   1. Bipolar disorder, in full remission, most recent episode mixed  ([place])  F31.78   2. 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 Primary insomnia  F51.01   4. Benzodiazepine dependence in remission ([place])  F13.21     Past Psychiatric History: I have reviewed past psychiatric history from my progress note from 06/26/2018.  Past trials of Symbyax, trazodone, Klonopin, Lunesta, Adderall, Wellbutrin, Chantix, propranolol, Cogentin, Prozac, Zyprexa  Past Medical History:  Past Medical History:  Diagnosis Date  . Anxiety   . Depression   . Kidney stones   . Kidney stones     Past Surgical History:  Procedure Laterality Date  . 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 Kidney failure Father     Social History: Reviewed social history from my progress note on 06/26/2018 Social History   Socioeconomic History  . Marital status: Married    Spouse name: [name]  . Number of children: 0  . Years of education: Not on file  . Highest education level: Not on file  Occupational History  . 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 Smoking status: Never Smoker  . Smokeless tobacco: Current User    Types: Chew  Vaping Use  . Vaping Use: Never used  Substance and Sexual Activity  . Alcohol use: Yes    Alcohol/week: 1.0 standard drink    Types: 1 Standard  drinks or equivalent per week    Comment: occassional  . Drug use: No  . Sexual activity: Yes  Other Topics Concern  . Not on file  Social History Narrative  . Not on file   Social Determinants of Health   Financial Resource Strain:   . Difficulty of Paying Living Expenses: Not on file  Food Insecurity:   . Worried About Charity fundraiser in the Last Year: Not on file  . Ran Out of Food in the Last Year: Not on file  Transportation Needs:   . Lack of  Transportation (Medical): Not on file  . Lack of Transportation (Non-Medical): Not on file  Physical Activity:   . Days of Exercise per Week: Not on file  . Minutes of Exercise per Session: Not on file  Stress:   . Feeling of Stress : Not on file  Social Connections:   . Frequency of Communication with Friends and Family: Not on file  . Frequency of Social Gatherings with Friends and Family: Not on file  . Attends Religious Services: Not on file  . Active Member of Clubs or Organizations: Not on file  . Attends Archivist Meetings: Not on file  . Marital Status: Not on file    Allergies:  Allergies  Allergen Reactions  . Flomax [Tamsulosin Hcl] Rash    Metabolic Disorder Labs: Lab Results  Component Value Date   HGBA1C 5.3 07/09/2018   Lab Results  Component Value Date   PROLACTIN 6.5 07/15/2019   PROLACTIN 13.7 07/09/2018   Lab Results  Component Value Date   CHOL 291 (H) 07/09/2018   TRIG 130 07/09/2018   HDL 47 07/09/2018   LDLCALC 218 (H) 07/09/2018   Lab Results  Component Value Date   TSH 1.49 06/24/2019   TSH 1.830 07/09/2018    Therapeutic Level Labs: No results found for: LITHIUM No results found for: VALPROATE No components found for:  CBMZ  Current Medications: Current Outpatient Medications  Medication Sig Dispense Refill  . [START ON 02/13/2020] Armodafinil 150 MG tablet Take 1 tablet (150 mg total) by mouth daily. 30 tablet 2  . cyclobenzaprine (FLEXERIL) 10 MG tablet Take 1 tablet (10 mg total) by mouth 3 (three) times daily as needed for muscle spasms. 20 tablet 0  . HYDROcodone-acetaminophen (NORCO/VICODIN) 5-325 MG tablet Take 1 tablet by mouth every 8 (eight) hours as needed for moderate pain. 15 tablet 0  . OLANZapine-FLUoxetine (SYMBYAX) 3-25 MG capsule Take 1 capsule by mouth every evening. 90 capsule 1  . zolpidem (AMBIEN) 5 MG tablet Take 1 tablet (5 mg total) by mouth at bedtime as needed for sleep. 30 tablet 2   No current  facility-administered medications for this visit.     Musculoskeletal: Strength & Muscle Tone: UTA Gait & Station: UTA Patient leans: N/A  Psychiatric Specialty Exam: Review of Systems  Neurological: Positive for tremors.  Psychiatric/Behavioral: Negative for agitation, behavioral problems, confusion, decreased concentration, dysphoric mood, hallucinations, self-injury, sleep disturbance and suicidal ideas. The patient is not nervous/anxious and is not hyperactive.   All other systems reviewed and are negative.   There were no vitals taken for this visit.There is no height or weight on file to calculate BMI.  General Appearance: Casual  Eye Contact:  Fair  Speech:  Clear and Coherent  Volume:  Normal  Mood:  Euthymic  Affect:  Congruent  Thought Process:  Goal Directed and Descriptions of Associations: Intact  Orientation:  Full (Time, Place,  and Person)  Thought Content: Logical   Suicidal Thoughts:  No  Homicidal Thoughts:  No  Memory:  Immediate;   Fair Recent;   Fair Remote;   Fair  Judgement:  Fair  Insight:  Fair  Psychomotor Activity:  Tremor chronic -does not bother him much  Concentration:  Concentration: Fair and Attention Span: Fair  Recall:  [employer] of Knowledge: Fair  Language: Fair  Akathisia:  No  Handed:  Right  AIMS (if indicated): UTA  Assets:  Communication Skills Desire for Improvement Housing Intimacy Social Support Talents/Skills Transportation Vocational/Educational  ADL's:  Intact  Cognition: WNL  Sleep:  Fair   Screenings:   Assessment and Plan: Dale Davis is a 44 year old Caucasian male, married, employed, lives in [place], has a history of bipolar disorder in remission, shiftwork disorder was evaluated by telemedicine today.  Patient is biologically predisposed given his history of mental health problems in his family.  Patient with psychosocial stressors of pandemic.  Patient however is currently stable on current  medication regimen.  Plan as noted below.  Plan Bipolar disorder in remission Symbyax 3 -25 mg p.o. daily  Shiftwork disorder-stable Nuvigil 150 mg p.o. daily in the morning I have reviewed [place] controlled substance database  Insomnia-stable Ambien 5 mg p.o. nightly as needed I have reviewed [place] controlled substance database.  Follow-up in clinic in 3 to 4 months or sooner if needed.  I have spent atleast 20 minutes face to face by video with patient today. More than 50 % of the time was spent for preparing to see the patient ( e.g., review of test, records ), ordering medications and test ,psychoeducation and supportive psychotherapy and care coordination,as well as documenting clinical information in electronic health record. This note was generated in part or whole with voice recognition software. Voice recognition is usually quite accurate but there are transcription errors that can and very often do occur. I apologize for any typographical errors that were not detected and corrected.      Ursula Alert, Davis 01/27/2020, 10:56 AM

## 2020-01-31 ENCOUNTER — Encounter: Payer: Managed Care, Other (non HMO) | Admitting: Internal Medicine

## 2020-02-11 ENCOUNTER — Encounter: Payer: Self-pay | Admitting: Internal Medicine

## 2020-02-11 ENCOUNTER — Other Ambulatory Visit: Payer: Self-pay

## 2020-02-11 ENCOUNTER — Ambulatory Visit (INDEPENDENT_AMBULATORY_CARE_PROVIDER_SITE_OTHER): Payer: Managed Care, Other (non HMO) | Admitting: Internal Medicine

## 2020-02-11 VITALS — BP 134/84 | HR 81 | Temp 97.8°F | Ht 71.0 in | Wt 229.0 lb

## 2020-02-11 DIAGNOSIS — Z114 Encounter for screening for human immunodeficiency virus [HIV]: Secondary | ICD-10-CM | POA: Diagnosis not present

## 2020-02-11 DIAGNOSIS — F3178 Bipolar disorder, in full remission, most recent episode mixed: Secondary | ICD-10-CM | POA: Diagnosis not present

## 2020-02-11 DIAGNOSIS — Z0001 Encounter for general adult medical examination with abnormal findings: Secondary | ICD-10-CM | POA: Diagnosis not present

## 2020-02-11 DIAGNOSIS — Z1159 Encounter for screening for other viral diseases: Secondary | ICD-10-CM | POA: Diagnosis not present

## 2020-02-11 DIAGNOSIS — E291 Testicular hypofunction: Secondary | ICD-10-CM | POA: Diagnosis not present

## 2020-02-11 DIAGNOSIS — F5101 Primary insomnia: Secondary | ICD-10-CM

## 2020-02-11 NOTE — Progress Notes (Signed)
Subjective:    Patient ID: Dale Davis, male    DOB: Jun 29, 1975, 45 y.o.   MRN: [id number]  HPI  Pt presents to the clinic today for his annual exam. He is also due to follow up chronic conditions.  Bipolar Depression: Persistent, managed on Symbax and Nuvigil. He follows with psychiatry. He is not seeing a therapist. He denies SI/HI.  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 He takes Ambien as needed with good relief of symptoms. There is no sleep study on file.  Hypogonadism: No longer doing testosterone replacement.   Flu: never Tetanus: 10/2015 Covid: Pfizer Colon Screening: never Vision Screening: as needed Dentist: biannually  Diet: He does eat meat. He consumes some veggies, not as much fruits. He tries to avoid fried foods. He drinks mostly Pepsi. Exercise: None  Review of Systems      Past Medical History:  Diagnosis Date  . Anxiety   . Depression   . Kidney stones   . Kidney stones     Current Outpatient Medications  Medication Sig Dispense Refill  . [START ON 02/13/2020] Armodafinil 150 MG tablet Take 1 tablet (150 mg total) by mouth daily. 30 tablet 2  . cyclobenzaprine (FLEXERIL) 10 MG tablet Take 1 tablet (10 mg total) by mouth 3 (three) times daily as needed for muscle spasms. 20 tablet 0  . HYDROcodone-acetaminophen (NORCO/VICODIN) 5-325 MG tablet Take 1 tablet by mouth every 8 (eight) hours as needed for moderate pain. 15 tablet 0  . OLANZapine-FLUoxetine (SYMBYAX) 3-25 MG capsule Take 1 capsule by mouth every evening. 90 capsule 1  . zolpidem (AMBIEN) 5 MG tablet Take 1 tablet (5 mg total) by mouth at bedtime as needed for sleep. 30 tablet 2   No current facility-administered medications for this visit.    Allergies  Allergen Reactions  . Flomax [Tamsulosin Hcl] Rash    Family History  Problem Relation Age of Onset  . Kidney failure Father     Social History   Socioeconomic History  . Marital status: Married     Spouse name: [name]  . Number of children: 0  . Years of education: Not on file  . Highest education level: Not on file  Occupational History  . 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 Smoking status: Never Smoker  . Smokeless tobacco: Current User    Types: Chew  Vaping Use  . Vaping Use: Never used  Substance and Sexual Activity  . Alcohol use: Yes    Alcohol/week: 1.0 standard drink    Types: 1 Standard drinks or equivalent per week    Comment: occassional  . Drug use: No  . Sexual activity: Yes  Other Topics Concern  . Not on file  Social History Narrative  . Not on file   Social Determinants of Health   Financial Resource Strain: Not on file  Food Insecurity: Not on file  Transportation Needs: Not on file  Physical Activity: Not on file  Stress: Not on file  Social Connections: Not on file  Intimate Partner Violence: Not on file     Constitutional: Denies fever, malaise, fatigue, headache or abrupt weight changes.  HEENT: Denies eye pain, eye redness, ear pain, ringing in the ears, wax buildup, runny nose, nasal congestion, bloody nose, or sore throat. Respiratory: Denies difficulty breathing, shortness of Davis, cough or sputum production.   Cardiovascular: Denies chest pain, chest tightness, palpitations or swelling in the  hands or feet.  Gastrointestinal: Denies abdominal pain, bloating, constipation, diarrhea or blood in the stool.  GU: Denies urgency, frequency, pain with urination, burning sensation, blood in urine, odor or discharge. Musculoskeletal: Denies decrease in range of motion, difficulty with gait, muscle pain or joint pain and swelling.  Skin: Denies redness, rashes, lesions or ulcercations.  Neurological: Pt reports insomnia. Denies dizziness, difficulty with memory, difficulty with speech or problems with balance and coordination.  Psych: Pt has a history of depression. Denies anxiety, SI/HI.  No  other specific complaints in a complete review of systems (except as listed in HPI above).  Objective:   Physical Exam   BP 134/84   Pulse 81   Temp 97.8 F (36.6 C) (Temporal)   Ht 5\' 11"  (1.803 m)   Wt 229 lb (103.9 kg)   SpO2 97%   BMI 31.94 kg/m   Wt Readings from Last 3 Encounters:  12/13/19 237 lb (107.5 kg)  07/15/19 220 lb (99.8 kg)  06/24/19 230 lb (104.3 kg)    General: Appears his stated age, obese, in NAD. Skin: Warm, dry and intact. No rashes noted. HEENT: Head: normal shape and size; Eyes: sclera white, no icterus, conjunctiva pink, PERRLA and EOMs intact;  Neck:  Neck supple, trachea midline. No masses, lumps or thyromegaly present.  Cardiovascular: Normal rate and rhythm. S1,S2 noted.  No murmur, rubs or gallops noted. No JVD or BLE edema No carotid bruits noted. Pulmonary/Chest: Normal effort and positive vesicular Davis sounds. No respiratory distress. No wheezes, rales or ronchi noted.  Abdomen: Soft and nontender. Normal bowel sounds. No distention or masses noted. Liver, spleen and kidneys non palpable. Musculoskeletal: Strength 5/5 BUE/BLE. No difficulty with gait.  Neurological: Alert and oriented. Cranial nerves II-XII grossly intact. Coordination normal.  Psychiatric: Mood and affect normal. Behavior is normal. Judgment and thought content normal.     BMET    Component Value Date/Time   NA 137 12/15/2019 1426   NA 141 07/09/2018 0836   K 3.9 12/15/2019 1426   CL 99 12/15/2019 1426   CO2 32 12/15/2019 1426   GLUCOSE 82 12/15/2019 1426   BUN 15 12/15/2019 1426   BUN 17 07/09/2018 0836   CREATININE 1.08 12/15/2019 1426   CALCIUM 9.1 12/15/2019 1426   GFRNONAA 77 07/09/2018 0836   GFRAA 89 07/09/2018 0836    Lipid Panel     Component Value Date/Time   CHOL 291 (H) 07/09/2018 0836   TRIG 130 07/09/2018 0836   HDL 47 07/09/2018 0836   LDLCALC 218 (H) 07/09/2018 0836    CBC    Component Value Date/Time   WBC 3.3 (L) 07/09/2018 0836    WBC 10.6 04/24/2015 1503   RBC 4.52 07/09/2018 0836   RBC 4.17 (L) 04/24/2015 1503   HGB 14.0 07/09/2018 0836   HCT 41.0 07/09/2018 0836   PLT 174 07/09/2018 0836   MCV 91 07/09/2018 0836   MCH 31.0 07/09/2018 0836   MCH 31.4 04/24/2015 1503   MCHC 34.1 07/09/2018 0836   MCHC 35.4 04/24/2015 1503   RDW 13.2 07/09/2018 0836   LYMPHSABS 0.8 07/09/2018 0836   MONOABS 0.8 04/18/2015 1930   EOSABS 0.1 07/09/2018 0836   BASOSABS 0.0 07/09/2018 0836    Hgb A1C Lab Results  Component Value Date   HGBA1C 5.3 07/09/2018           Assessment & Plan:   Preventative Health Maintenance:  He declines flu shot Tetanus UTD Covid UTD He declines  referral to GI for screening colonoscopy Encouraged him to consume a balanced diet and exercise regimen Advised him to see an eye doctor and dentist annually Will check CBC, CMET, Lipid, A1C, HIV and Hep C today  RTC in 1 year, sooner if needed [name], NP This visit occurred during the SARS-CoV-2 public health emergency.  Safety protocols were in place, including screening questions prior to the visit, additional usage of staff PPE, and extensive cleaning of exam room while observing appropriate contact time as indicated for disinfecting solutions.

## 2020-02-14 ENCOUNTER — Encounter: Payer: Self-pay | Admitting: Internal Medicine

## 2020-02-14 LAB — COMPREHENSIVE METABOLIC PANEL
AG Ratio: 1.7 (calc) (ref 1.0–2.5)
ALT: 49 U/L — ABNORMAL HIGH (ref 9–46)
AST: 26 U/L (ref 10–40)
Albumin: 4.3 g/dL (ref 3.6–5.1)
Alkaline phosphatase (APISO): 62 U/L (ref 36–130)
BUN: 21 mg/dL (ref 7–25)
CO2: 26 mmol/L (ref 20–32)
Calcium: 9.4 mg/dL (ref 8.6–10.3)
Chloride: 103 mmol/L (ref 98–110)
Creat: 1.08 mg/dL (ref 0.60–1.35)
Globulin: 2.6 g/dL (calc) (ref 1.9–3.7)
Glucose, Bld: 70 mg/dL (ref 65–99)
Potassium: 4 mmol/L (ref 3.5–5.3)
Sodium: 140 mmol/L (ref 135–146)
Total Bilirubin: 0.5 mg/dL (ref 0.2–1.2)
Total Protein: 6.9 g/dL (ref 6.1–8.1)

## 2020-02-14 LAB — CBC
HCT: 41.5 % (ref 38.5–50.0)
Hemoglobin: 14.7 g/dL (ref 13.2–17.1)
MCH: 31.5 pg (ref 27.0–33.0)
MCHC: 35.4 g/dL (ref 32.0–36.0)
MCV: 88.9 fL (ref 80.0–100.0)
MPV: 11.2 fL (ref 7.5–12.5)
Platelets: 203 10*3/uL (ref 140–400)
RBC: 4.67 10*6/uL (ref 4.20–5.80)
RDW: 13.7 % (ref 11.0–15.0)
WBC: 5.4 10*3/uL (ref 3.8–10.8)

## 2020-02-14 LAB — HEMOGLOBIN A1C
Hgb A1c MFr Bld: 5.4 % of total Hgb (ref <5.7)
Mean Plasma Glucose: 108 mg/dL
eAG (mmol/L): 6 mmol/L

## 2020-02-14 LAB — HEPATITIS C ANTIBODY
Hepatitis C Ab: NONREACTIVE
SIGNAL TO CUT-OFF: 0.01 (ref <1.00)

## 2020-02-14 LAB — LIPID PANEL
Cholesterol: 285 mg/dL — ABNORMAL HIGH (ref <200)
HDL: 37 mg/dL — ABNORMAL LOW (ref >=40)
LDL Cholesterol (Calc): 196 mg/dL (calc) — ABNORMAL HIGH
Non-HDL Cholesterol (Calc): 248 mg/dL (calc) — ABNORMAL HIGH (ref <130)
Total CHOL/HDL Ratio: 7.7 (calc) — ABNORMAL HIGH (ref <5.0)
Triglycerides: 309 mg/dL — ABNORMAL HIGH (ref <150)

## 2020-02-14 LAB — HIV ANTIBODY (ROUTINE TESTING W REFLEX): HIV 1&2 Ab, 4th Generation: NONREACTIVE

## 2020-02-14 NOTE — Patient Instructions (Signed)

## 2020-02-17 ENCOUNTER — Encounter: Payer: Self-pay | Admitting: Internal Medicine

## 2020-02-17 DIAGNOSIS — E782 Mixed hyperlipidemia: Secondary | ICD-10-CM

## 2020-02-21 MED ORDER — ROSUVASTATIN CALCIUM 5 MG PO TABS: 5.0000 mg | ORAL_TABLET | Freq: Every day | ORAL | 2 refills | Status: DC

## 2020-03-06 ENCOUNTER — Encounter: Payer: Self-pay | Admitting: Internal Medicine

## 2020-03-11 ENCOUNTER — Other Ambulatory Visit: Payer: Self-pay | Admitting: Psychiatry

## 2020-03-11 DIAGNOSIS — G4700 Insomnia, unspecified: Secondary | ICD-10-CM

## 2020-03-20 ENCOUNTER — Other Ambulatory Visit: Payer: Self-pay | Admitting: Psychiatry

## 2020-03-20 DIAGNOSIS — F3178 Bipolar disorder, in full remission, most recent episode mixed: Secondary | ICD-10-CM

## 2020-04-14 IMAGING — DX LEFT SHOULDER - 2+ VIEW
3 series · 3 of 3 positions shown · non-contrast
Comparison: None.

CLINICAL DATA: 42-year-old male with a history of left shoulder
pain

EXAM:
LEFT SHOULDER - 2+ VIEW

[shoulder axial]
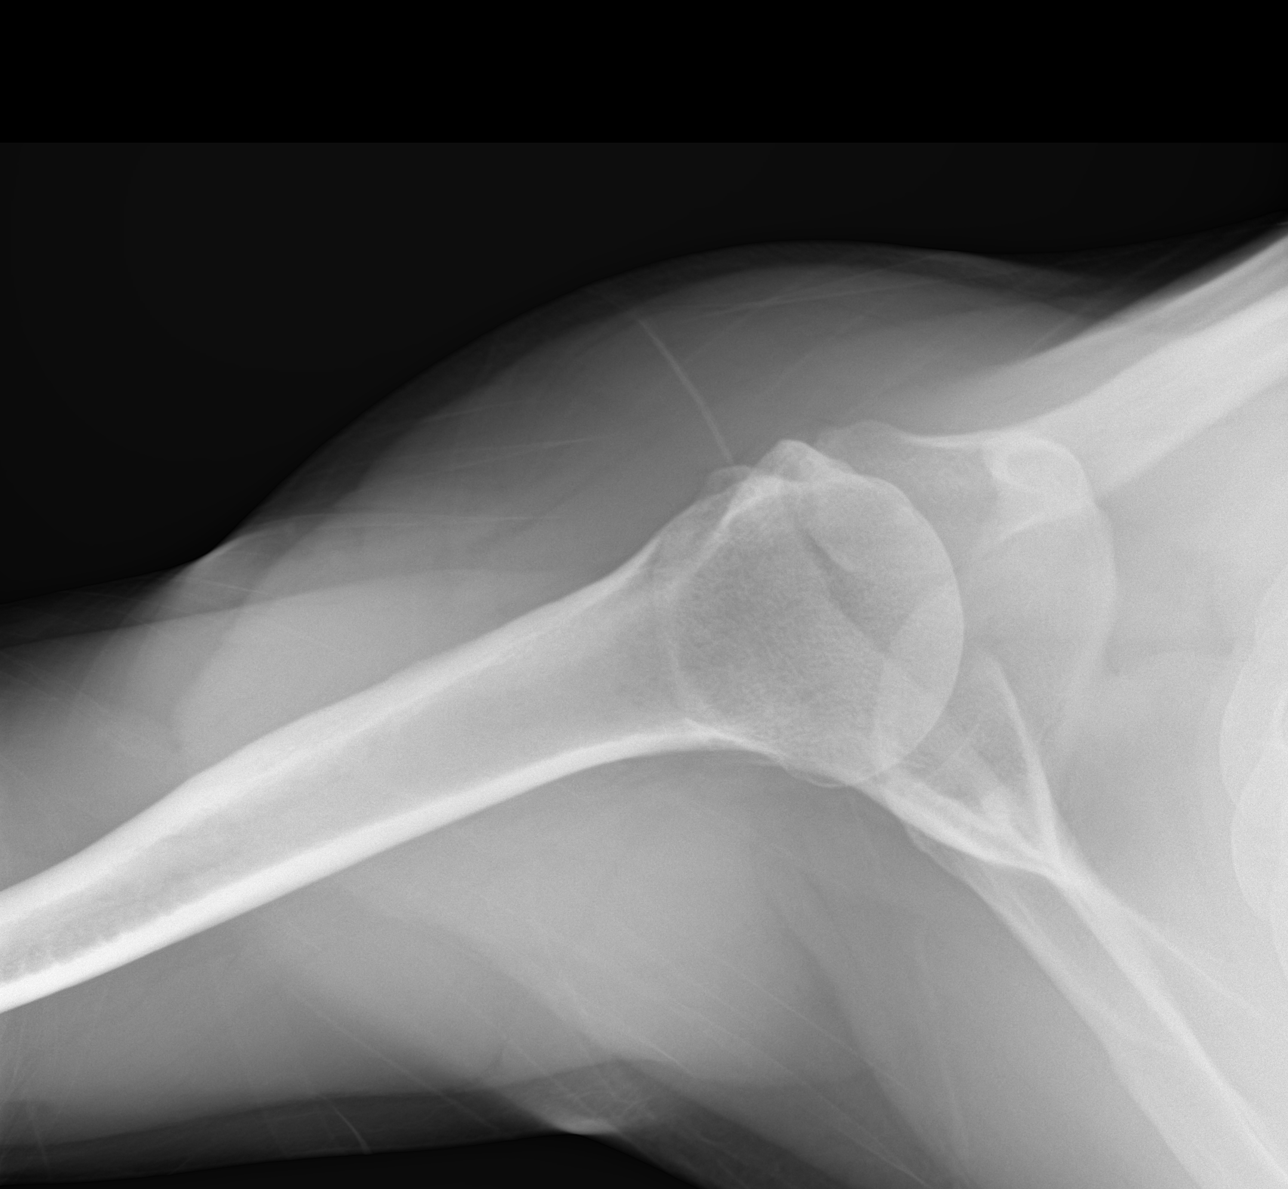

[shoulder obl]
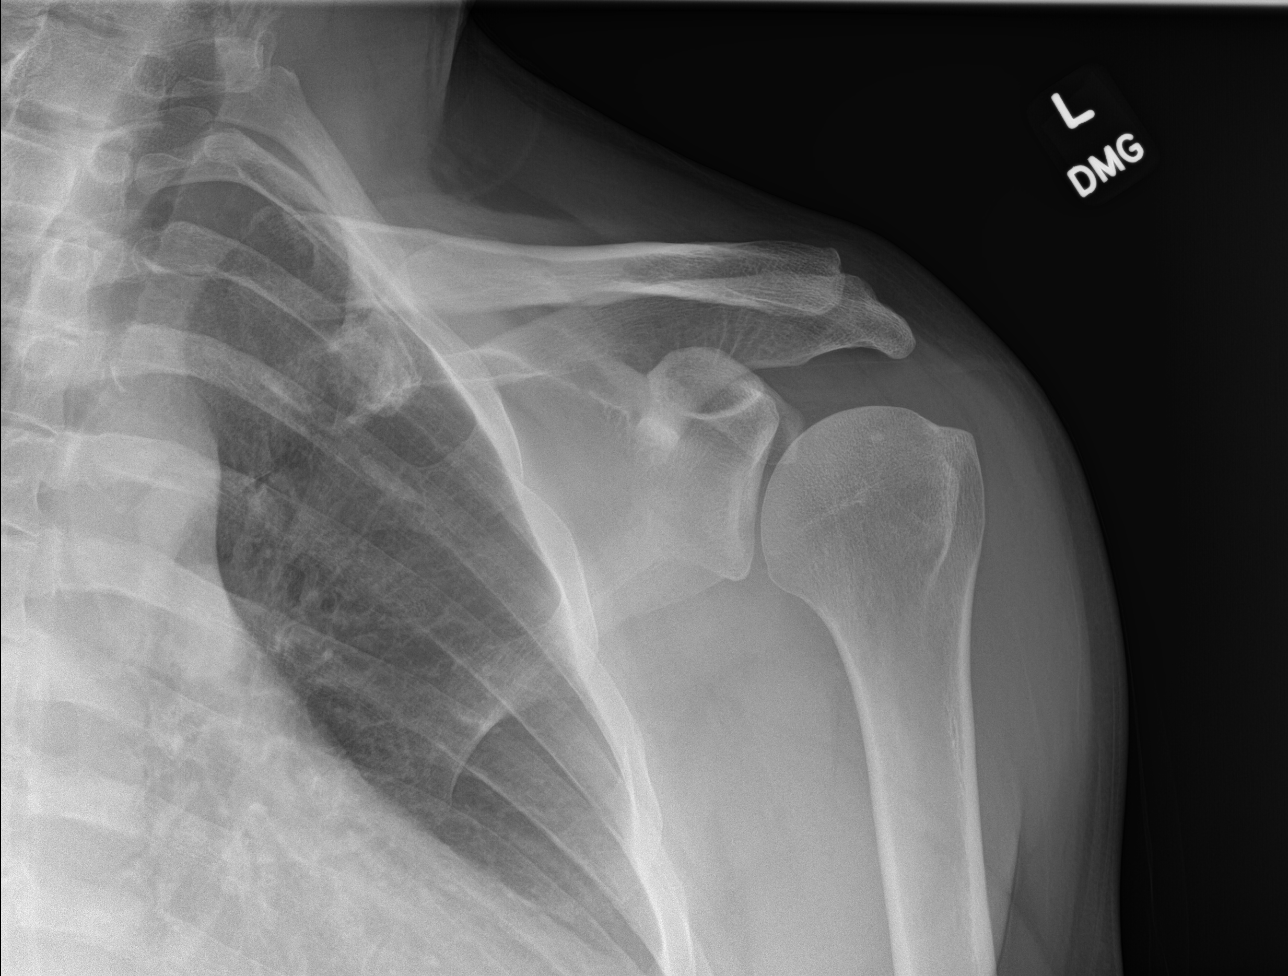

[shoulder y-view]
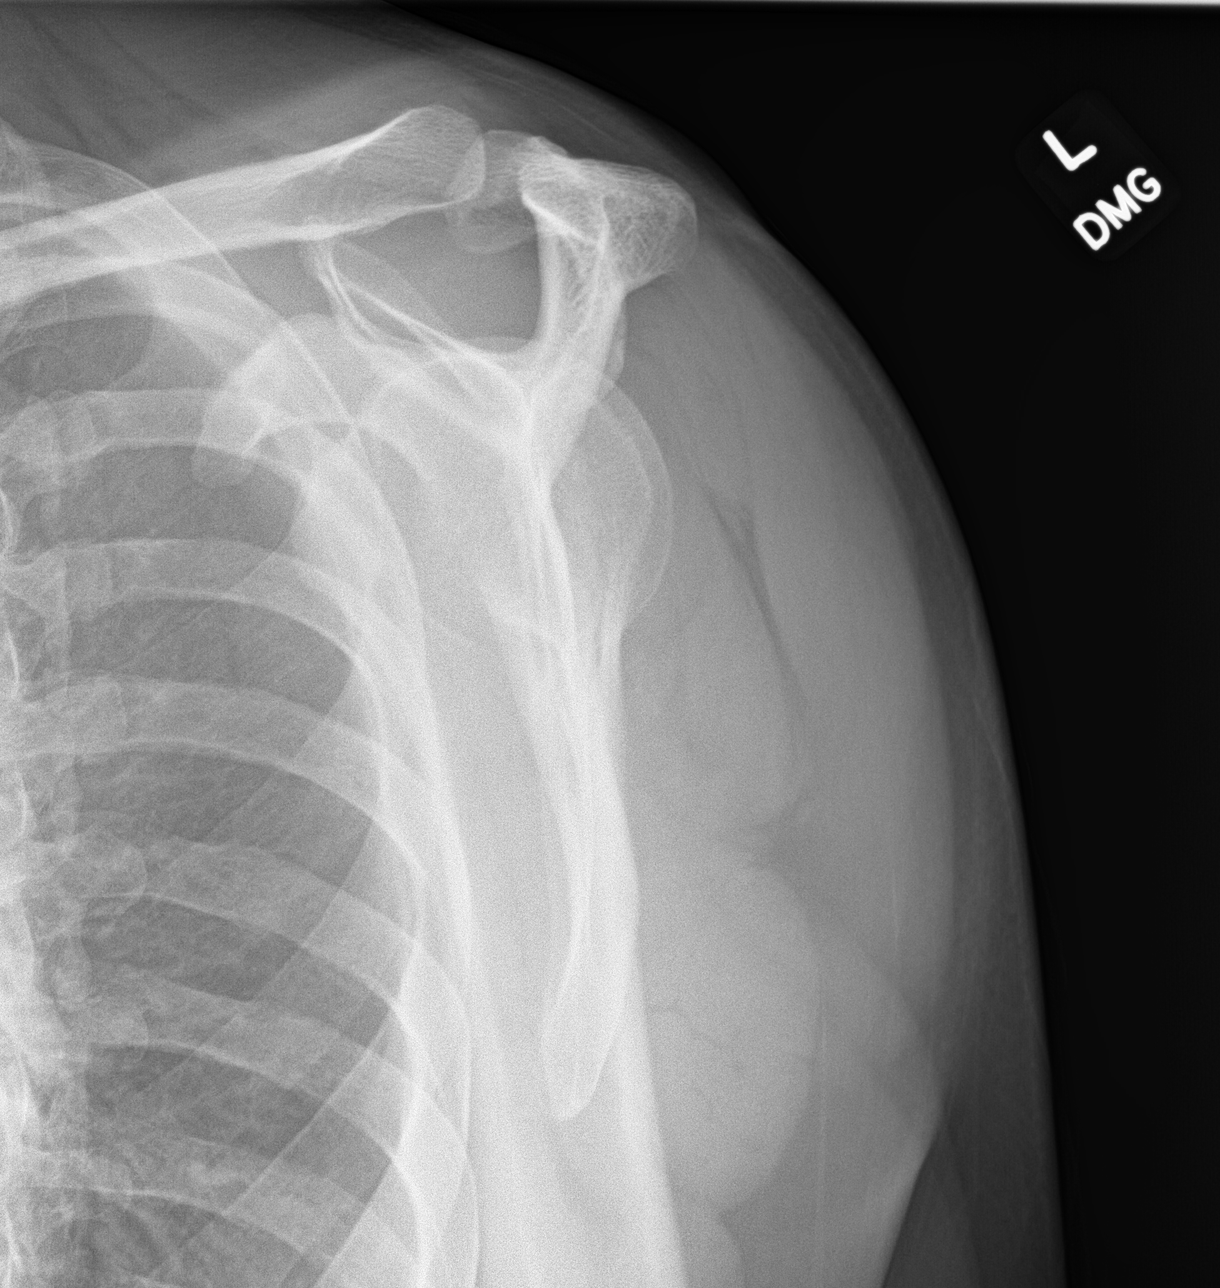

[3 of 3 positions shown; findings below may reference images not displayed]

FINDINGS: No acute displaced fracture. Glenohumeral joint is congruent. No
radiopaque foreign body. No significant degenerative changes.
Unremarkable scapular Y-view.
IMPRESSION: Negative for acute bony abnormality

## 2020-04-15 ENCOUNTER — Other Ambulatory Visit: Payer: Self-pay | Admitting: Psychiatry

## 2020-04-15 DIAGNOSIS — F3178 Bipolar disorder, in full remission, most recent episode mixed: Secondary | ICD-10-CM

## 2020-05-12 ENCOUNTER — Other Ambulatory Visit: Payer: Self-pay | Admitting: Psychiatry

## 2020-05-12 DIAGNOSIS — G4726 Circadian rhythm sleep disorder, shift work type: Secondary | ICD-10-CM

## 2020-05-18 ENCOUNTER — Telehealth: Payer: Self-pay

## 2020-05-18 NOTE — Telephone Encounter (Signed)
submitted the prior auth for the armodafinil

## 2020-05-26 ENCOUNTER — Other Ambulatory Visit (INDEPENDENT_AMBULATORY_CARE_PROVIDER_SITE_OTHER): Payer: Managed Care, Other (non HMO)

## 2020-05-26 ENCOUNTER — Other Ambulatory Visit: Payer: Self-pay

## 2020-05-26 DIAGNOSIS — E782 Mixed hyperlipidemia: Secondary | ICD-10-CM

## 2020-05-27 LAB — COMPREHENSIVE METABOLIC PANEL
ALT: 64 IU/L — ABNORMAL HIGH (ref 0–44)
AST: 29 IU/L (ref 0–40)
Albumin/Globulin Ratio: 1.8 (ref 1.2–2.2)
Albumin: 4.6 g/dL (ref 4.0–5.0)
Alkaline Phosphatase: 80 IU/L (ref 44–121)
BUN/Creatinine Ratio: 23 — ABNORMAL HIGH (ref 9–20)
BUN: 24 mg/dL (ref 6–24)
Bilirubin Total: 0.4 mg/dL (ref 0.0–1.2)
CO2: 25 mmol/L (ref 20–29)
Calcium: 9.8 mg/dL (ref 8.7–10.2)
Chloride: 103 mmol/L (ref 96–106)
Creatinine, Ser: 1.05 mg/dL (ref 0.76–1.27)
Globulin, Total: 2.5 g/dL (ref 1.5–4.5)
Glucose: 107 mg/dL — ABNORMAL HIGH (ref 65–99)
Potassium: 4.4 mmol/L (ref 3.5–5.2)
Sodium: 142 mmol/L (ref 134–144)
Total Protein: 7.1 g/dL (ref 6.0–8.5)
eGFR: 89 mL/min/{1.73_m2} (ref >59)

## 2020-05-27 LAB — LIPID PANEL
Chol/HDL Ratio: 4.6 ratio (ref 0.0–5.0)
Cholesterol, Total: 169 mg/dL (ref 100–199)
HDL: 37 mg/dL — ABNORMAL LOW (ref >39)
LDL Chol Calc (NIH): 115 mg/dL — ABNORMAL HIGH (ref 0–99)
Triglycerides: 92 mg/dL (ref 0–149)
VLDL Cholesterol Cal: 17 mg/dL (ref 5–40)

## 2020-06-06 ENCOUNTER — Other Ambulatory Visit: Payer: Self-pay

## 2020-06-06 ENCOUNTER — Telehealth (INDEPENDENT_AMBULATORY_CARE_PROVIDER_SITE_OTHER): Payer: 59 | Admitting: Psychiatry

## 2020-06-06 ENCOUNTER — Telehealth: Payer: Self-pay

## 2020-06-06 ENCOUNTER — Encounter: Payer: Self-pay | Admitting: Psychiatry

## 2020-06-06 DIAGNOSIS — F3178 Bipolar disorder, in full remission, most recent episode mixed: Secondary | ICD-10-CM | POA: Diagnosis not present

## 2020-06-06 DIAGNOSIS — G4726 Circadian rhythm sleep disorder, shift work type: Secondary | ICD-10-CM

## 2020-06-06 DIAGNOSIS — Z79899 Other long term (current) drug therapy: Secondary | ICD-10-CM | POA: Diagnosis not present

## 2020-06-06 MED ORDER — ZALEPLON 5 MG PO CAPS: 5.0000 mg | ORAL_CAPSULE | Freq: Every evening | ORAL | 1 refills | Status: DC | PRN

## 2020-06-06 NOTE — Telephone Encounter (Signed)
Spoke with patient because I noticed he had a [place] address but he stated he worked in [employer] and to send to [facility] at the [employer] on [address]  faxed to [facility] at [employer] at [address] [phone] - confirmed.

## 2020-06-06 NOTE — Patient Instructions (Signed)
Zaleplon capsules What is this medicine? ZALEPLON (ZAL e plon) is used to treat insomnia. This medicine helps you to fall asleep. This medicine may be used for other purposes; ask your health care provider or pharmacist if you have questions. COMMON BRAND NAME(S): Sonata What should I tell my health care provider before I take this medicine? They need to know if you have any of these conditions:  depression  history of a drug or alcohol abuse problem  liver disease  lung or breathing disease  sleep-walking, driving, eating or other activity while not fully awake after taking a sleep medicine  suicidal thoughts  an unusual or allergic reaction to zaleplon, other medicines, foods, dyes, or preservatives  pregnant or trying to get pregnant  breast-feeding How should I use this medicine? Take this medicine by mouth with water. Take it as directed on the label. Do not use it more often than directed. There may be unused or extra doses in the bottle after you finish your treatment. Talk to your health care provider if you have questions about your dose. A special MedGuide will be given to you by the pharmacist with each prescription and refill. Be sure to read this information carefully each time. Talk to your health care provider about the use of this medicine in children. Special care may be needed. Overdosage: If you think you have taken too much of this medicine contact a poison control center or emergency room at once. NOTE: This medicine is only for you. Do not share this medicine with others. What if I miss a dose? This does not apply. This medicine should only be taken immediately before going to sleep. Do not take double or extra doses. What may interact with this medicine?  barbiturate medicines for inducing sleep or treating seizures  carbamazepine  certain medicines for allergies, like azatadine, clemastine, diphenhydramine  certain medicines for depression, anxiety, or  other emotional or psychiatric problems  certain medicines for pain  cimetidine  erythromycin  medicines for fungal infections like ketoconazole, fluconazole, or itraconazole  other medicines given for sleep  phenytoin  rifampin This list may not describe all possible interactions. Give your health care provider a list of all the medicines, herbs, non-prescription drugs, or dietary supplements you use. Also tell them if you smoke, drink alcohol, or use illegal drugs. Some items may interact with your medicine. What should I watch for while using this medicine? Visit your doctor or health care professional for regular checks on your progress. Keep a regular sleep schedule by going to bed at about the same time each night. Avoid caffeine-containing drinks in the evening hours. When sleep medicines are used every night for more than a few weeks, they may stop working. Talk to your doctor if you still have trouble sleeping. After taking this medicine, you may get up out of bed and do an activity that you do not know you are doing. The next morning, you may have no memory of this. Activities include driving a car ("sleep-driving"), making and eating food, talking on the phone, sexual activity, and sleep-walking. Serious injuries have occurred. Stop the medicine and call your doctor right away if you find out you have done any of these activities. Do not take this medicine if you have used alcohol that evening. Do not take it if you have taken another medicine for sleep. The risk of doing these sleep-related activities is higher. Do not take this medicine unless you are able to stay in  bed for a full night (7 to 8 hours) before you must be active again. You may have a decrease in mental alertness the day after use, even if you feel that you are fully awake. Tell your doctor if you will need to perform activities requiring full alertness, such as driving, the next day. Do not stand or sit up quickly  after taking this medicine, especially if you are an older patient. This reduces the risk of dizzy or fainting spells. If you or your family notice any changes in your behavior, such as new or worsening depression, thoughts of harming yourself, anxiety, other unusual or disturbing thoughts, or memory loss, call your doctor right away. After you stop taking this medicine, you may have trouble falling asleep. This is called rebound insomnia. This problem usually goes away on its own after 1 or 2 nights. What side effects may I notice from receiving this medicine? Side effects that you should report to your doctor or health care professional as soon as possible:  allergic reactions like skin rash, itching or hives, swelling of the face, lips, or tongue  breathing problems  changes in vision  confusion  depression, suicidal thoughts  feeling faint or lightheaded  hallucinations  hostility, restlessness, excitability  slurred speech  staggering, tremors  unusual activities while not fully awake like driving, eating, making phone calls  unusually weak or tired Side effects that usually do not require medical attention (report to your doctor or health care professional if they continue or are bothersome):  diarrhea  difficulty with coordination  loss of memory  nightmares  stomach upset This list may not describe all possible side effects. Call your doctor for medical advice about side effects. You may report side effects to FDA at 1-800-FDA-1088. Where should I keep my medicine? Keep out of the reach of children. This medicine can be abused. Keep your medicine in a safe place to protect it from theft. Do not share this medicine with anyone. Selling or giving away this medicine is dangerous and against the law. This medicine may cause accidental overdose and death if taken by other adults, children, or pets. Mix any unused medicine with a substance like cat litter or coffee  grounds. Then throw the medicine away in a sealed container like a sealed bag or a coffee can with a lid. Do not use the medicine after the expiration date. Store at room temperature between 20 and 25 degrees C (68 and 77 degrees F). Protect from light. NOTE: This sheet is a summary. It may not cover all possible information. If you have questions about this medicine, talk to your doctor, pharmacist, or health care provider.  2021 Elsevier/Gold Standard (2018-02-17 09:26:01)

## 2020-06-06 NOTE — Progress Notes (Signed)
Virtual Visit via Video Note  I connected with Dale Davis on 06/06/20 at 11:30 AM EDT by a video enabled telemedicine application and verified that I am speaking with the correct person using two identifiers.  Location Provider Location : [facility] Patient Location : Work  Participants: Patient , Provider    I discussed the limitations of evaluation and management by telemedicine and the availability of in person appointments. The patient expressed understanding and agreed to proceed.   I discussed the assessment and treatment plan with the patient. The patient was provided an opportunity to ask questions and all were answered. The patient agreed with the plan and demonstrated an understanding of the instructions.   The patient was advised to call back or seek an in-person evaluation if the symptoms worsen or if the condition fails to improve as anticipated.   [facility] MD OP Progress Note  06/06/2020 12:59 PM Dale Davis  MRN:  [id number]  Chief Complaint:  Chief Complaint    Follow-up; Anxiety; Insomnia     HPI: Dale Davis is a 45 year old Caucasian male, married, employed, lives in [place], has a history of bipolar disorder, shiftwork disorder, benzodiazepine dependence, insomnia was evaluated by telemedicine today.  Patient today reports he is currently struggling with hypersomnia.  He reports he is able to sleep at night with the Ambien.  He gets around 8 hours of sleep.  He however feels the sleep is not quality sleep since he wakes up feeling groggy.  He reports that Nuvigil does wake him up and helps him to function during the day.  However it takes him a few hours before he can feel the Nuvigil effect.  He wanted to get off of the Nuvigil in the past however did not tolerate tapering process well.  Patient reports mood wise he is doing okay.  Denies any significant anxiety or panic attacks.  Denies mood lability, manic or hypomanic symptoms.  Patient with recent  elevated LFT-he reports his primary care provider is monitoring it closely.  Patient denies any suicidality, homicidality or perceptual disturbances.  Patient denies any other concerns today.  Visit Diagnosis:    ICD-10-CM   1. Bipolar disorder, in full remission, most recent episode mixed (Dale Davis)  F31.78   2. Shift work sleep disorder  G47.26 zaleplon (SONATA) 5 MG capsule  3. High risk medication use  Z79.899 Prolactin    Urine drugs of abuse scrn w alc, routine (Ref Lab)    Past Psychiatric History: I have reviewed past psychiatric history from my progress note on 06/26/2018.  Past trials of Symbyax, trazodone, Klonopin, Lunesta, Adderall, Wellbutrin, Chantix, propranolol, Cogentin, Prozac, Zyprexa  Past Medical History:  Past Medical History:  Diagnosis Date  . Anxiety   . Depression   . Kidney stones   . Kidney stones     Past Surgical History:  Procedure Laterality Date  . 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 Kidney failure Father     Social History: I have reviewed social history from my progress note on 06/26/2018 Social History   Socioeconomic History  . Marital status: Married    Spouse name: [name]  . Number of children: 0  . Years of education: Not on file  . Highest education level: Not on file  Occupational History  .  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 Smoking status: Never Smoker  . Smokeless tobacco: Current User    Types: Chew  Vaping Use  . Vaping Use: Never used  Substance and Sexual Activity  . Alcohol use: Yes    Alcohol/week: 1.0 standard drink    Types: 1 Standard drinks or equivalent per week    Comment: occassional  . Drug use: No  . Sexual activity: Yes  Other Topics Concern  . Not on file  Social History  Narrative  . Not on file   Social Determinants of Health   Financial Resource Strain: Not on file  Food Insecurity: Not on file  Transportation Needs: Not on file  Physical Activity: Not on file  Stress: Not on file  Social Connections: Not on file    Allergies:  Allergies  Allergen Reactions  . Flomax [Tamsulosin Hcl] Rash    Metabolic Disorder Labs: Lab Results  Component Value Date   HGBA1C 5.4 02/11/2020   MPG 108 02/11/2020   Lab Results  Component Value Date   PROLACTIN 10.7 06/06/2020   PROLACTIN 6.5 07/15/2019   Lab Results  Component Value Date   CHOL 169 05/26/2020   TRIG 92 05/26/2020   HDL 37 (L) 05/26/2020   CHOLHDL 4.6 05/26/2020   LDLCALC 115 (H) 05/26/2020   LDLCALC 196 (H) 02/11/2020   Lab Results  Component Value Date   TSH 1.49 06/24/2019   TSH 1.830 07/09/2018    Therapeutic Level Labs: No results found for: LITHIUM No results found for: VALPROATE No components found for:  CBMZ  Current Medications: Current Outpatient Medications  Medication Sig Dispense Refill  . zaleplon (SONATA) 5 MG capsule Take 1 capsule (5 mg total) by mouth at bedtime as needed for sleep. 15 capsule 1  . Armodafinil 150 MG tablet Take 1 tablet (150 mg total) by mouth daily. 30 tablet 2  . OLANZapine-FLUoxetine (SYMBYAX) 3-25 MG capsule TAKE 1 CAPSULE BY MOUTH EVERY EVENING 90 capsule 0  . rosuvastatin (CRESTOR) 5 MG tablet Take 1 tablet (5 mg total) by mouth daily. 30 tablet 2  . XYOSTED 7 MG/0.5ML SOAJ SMARTSIG:1 Pre-Filled Pen Syringe SUB-Q Once a Week     No current facility-administered medications for this visit.     Musculoskeletal: Strength & Muscle Tone: UTA Gait & Station: UTA Patient leans: N/A  Psychiatric Specialty Exam: Review of Systems  Psychiatric/Behavioral: Positive for sleep disturbance.  All other systems reviewed and are negative.   There were no vitals taken for this visit.There is no height or weight on file to calculate BMI.   General Appearance: Casual  Eye Contact:  Fair  Speech:  Clear and Coherent  Volume:  Normal  Mood:  Euthymic  Affect:  Congruent  Thought Process:  Goal Directed and Descriptions of Associations: Intact  Orientation:  Full (Time, Place, and Person)  Thought Content: Logical   Suicidal Thoughts:  No  Homicidal Thoughts:  No  Memory:  Immediate;   Fair Recent;   Fair Remote;   Fair  Judgement:  Fair  Insight:  Fair  Psychomotor Activity:  Normal  Concentration:  Concentration: Fair and Attention Span: Fair  Recall:  [employer] of Knowledge: Fair  Language: Fair  Akathisia:  No  Handed:  Right  AIMS (if indicated): UTA  Assets:  Communication Skills Desire for Improvement Housing Social Support  ADL's:  Intact  Cognition: WNL  Sleep:  Restless, has  hypersomnia   Screenings:   Assessment and Plan: Dale Davis is a 45 year old Caucasian male, married, employed, lives in [place], has a history of bipolar disorder in remission, shiftwork disorder, primary insomnia, hypersomnia was evaluated by telemedicine today.  Patient currently struggles with hypersomnia, will benefit from the following plan.  Plan Bipolar disorder in remission Symbyax 3-25 mg p.o. daily. Patient currently takes her Symbyax at bedtime.  Discussed with patient since there is olanzapine in the Symbyax he may not tolerate it well if he takes it during the day since that can make him sleepy. He did not tolerate being tapered off of the Symbyax in the past.  Shiftwork sleep disorder-unstable Nuvigil 150 mg p.o. daily I have reviewed [place] controlled substance database. Will refer patient for sleep study since he has hypersomnia. Epworth sleep scale-10 today Discontinue Ambien and start Sonata 5 mg p.o. nightly Provided medication education.  High risk medication use-I have reviewed the following labs-lipid panel, CMP-dated 05/26/2020.  Patient will continue to follow-up with his primary care provider  for abnormal labs.  Patient with LFT-elevated-we will monitor closely.  His medications like Symbyax could also be contributing to elevated LFTs-however will not make any medication changes today.  Will order labs-prolactin, urine drug screen.  Will fax to [facility].  Follow-up in clinic in person in 3 to 4 weeks or sooner if needed.  This note was generated in part or whole with voice recognition software. Voice recognition is usually quite accurate but there are transcription errors that can and very often do occur. I apologize for any typographical errors that were not detected and corrected.        Ursula Alert, MD 06/07/2020, 12:03 PM

## 2020-06-07 LAB — URINE DRUGS OF ABUSE SCREEN W ALC, ROUTINE (REF LAB)
Amphetamines, Urine: NEGATIVE ng/mL
Barbiturate Quant, Ur: NEGATIVE ng/mL
Benzodiazepine Quant, Ur: NEGATIVE ng/mL
Cannabinoid Quant, Ur: NEGATIVE ng/mL
Cocaine (Metab.): NEGATIVE ng/mL
Ethanol, Urine: NEGATIVE %
Methadone Screen, Urine: NEGATIVE ng/mL
Opiate Quant, Ur: NEGATIVE ng/mL
PCP Quant, Ur: NEGATIVE ng/mL
Propoxyphene: NEGATIVE ng/mL

## 2020-06-07 LAB — PROLACTIN: Prolactin: 10.7 ng/mL (ref 4.0–15.2)

## 2020-07-03 ENCOUNTER — Other Ambulatory Visit: Payer: Self-pay | Admitting: Psychiatry

## 2020-07-03 DIAGNOSIS — G4726 Circadian rhythm sleep disorder, shift work type: Secondary | ICD-10-CM

## 2020-07-04 ENCOUNTER — Other Ambulatory Visit: Payer: Managed Care, Other (non HMO)

## 2020-07-07 ENCOUNTER — Telehealth (INDEPENDENT_AMBULATORY_CARE_PROVIDER_SITE_OTHER): Payer: 59 | Admitting: Psychiatry

## 2020-07-07 ENCOUNTER — Telehealth: Payer: Self-pay | Admitting: Psychiatry

## 2020-07-07 ENCOUNTER — Ambulatory Visit: Payer: Managed Care, Other (non HMO) | Admitting: Urology

## 2020-07-07 ENCOUNTER — Other Ambulatory Visit: Payer: Self-pay

## 2020-07-07 DIAGNOSIS — G4726 Circadian rhythm sleep disorder, shift work type: Secondary | ICD-10-CM

## 2020-07-07 DIAGNOSIS — F3178 Bipolar disorder, in full remission, most recent episode mixed: Secondary | ICD-10-CM

## 2020-07-07 MED ORDER — OLANZAPINE-FLUOXETINE HCL 3-25 MG PO CAPS: 1.0000 | ORAL_CAPSULE | Freq: Every evening | ORAL | 0 refills | Status: DC

## 2020-07-07 MED ORDER — ZALEPLON 5 MG PO CAPS
ORAL_CAPSULE | ORAL | 1 refills | Status: DC
Start: 2020-07-07 — End: 2020-09-18

## 2020-07-07 NOTE — Telephone Encounter (Signed)
Patient's appointment for today had to be rescheduled since he tested positive for COVID-19 last night.  Will send refills to pharmacy.  His appointment has been rescheduled.

## 2020-07-07 NOTE — Progress Notes (Signed)
Patient connected and reported that he has COVID-19 infection.  Advised patient to reschedule appointment.  Will cancel this appointment for today.

## 2020-07-16 ENCOUNTER — Encounter: Payer: Self-pay | Admitting: Internal Medicine

## 2020-07-16 DIAGNOSIS — E782 Mixed hyperlipidemia: Secondary | ICD-10-CM

## 2020-07-17 MED ORDER — ROSUVASTATIN CALCIUM 5 MG PO TABS: 5.0000 mg | ORAL_TABLET | Freq: Every day | ORAL | 0 refills | Status: DC

## 2020-08-16 ENCOUNTER — Ambulatory Visit (INDEPENDENT_AMBULATORY_CARE_PROVIDER_SITE_OTHER): Payer: 59 | Admitting: Psychiatry

## 2020-08-16 ENCOUNTER — Other Ambulatory Visit: Payer: Self-pay

## 2020-08-16 ENCOUNTER — Encounter: Payer: Self-pay | Admitting: Psychiatry

## 2020-08-16 VITALS — BP 130/84 | HR 71 | Temp 97.3°F | Wt 222.0 lb

## 2020-08-16 DIAGNOSIS — F3178 Bipolar disorder, in full remission, most recent episode mixed: Secondary | ICD-10-CM

## 2020-08-16 DIAGNOSIS — R7989 Other specified abnormal findings of blood chemistry: Secondary | ICD-10-CM | POA: Insufficient documentation

## 2020-08-16 DIAGNOSIS — G4726 Circadian rhythm sleep disorder, shift work type: Secondary | ICD-10-CM

## 2020-08-16 MED ORDER — ARMODAFINIL 150 MG PO TABS: 150.0000 mg | ORAL_TABLET | Freq: Every day | ORAL | 2 refills | Status: DC

## 2020-08-16 MED ORDER — OLANZAPINE-FLUOXETINE HCL 3-25 MG PO CAPS: 1.0000 | ORAL_CAPSULE | ORAL | 0 refills | Status: DC

## 2020-08-16 NOTE — Progress Notes (Signed)
[facility] MD OP Progress Note  08/16/2020 1:00 PM SONAM HUELSMANN  MRN:  [id number]  Chief Complaint:  Chief Complaint   Follow-up    HPI: Dale Davis is a 45 year old Caucasian male, married, employed, lives in [place], has a history of bipolar disorder, shiftwork disorder, insomnia was evaluated in office today.  Patient today reports he is currently doing well.  He had COVID-19 infection in May however recovered well.  He only had mild symptoms.  He denies any sadness, anxiety symptoms.  Denies any mood swings.  Denies any significant sleep problems and reports Sonata is beneficial.  Denies side effects.  Denies suicidality, homicidality or perceptual disturbances.  He is interested in coming off of this Symbyax if possible.  Patient reports work is busy however he has been coping with it well.  Patient denies any other concerns today.  Visit Diagnosis:    ICD-10-CM   1. Bipolar disorder, in full remission, most recent episode mixed ([facility])  F31.78 OLANZapine-FLUoxetine (SYMBYAX) 3-25 MG capsule    2. 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 Elevated LFTs  R79.89       Past Psychiatric History: Reviewed past psychiatric history from progress note on 06/26/2018.  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   big toe    Family Psychiatric History: I have reviewed family psychiatric history from progress note on 06/26/2018  Family History:  Family History  Problem Relation Age of Onset   Kidney failure Father     Social History: I have reviewed social history from progress note on 06/26/2018  Social History   Socioeconomic History    Marital status: Married    Spouse name: [name]   Number of children: 0   Years of education: Not on file   Highest education level: Not on file  Occupational History   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  Tobacco Use   Smoking status: Never   Smokeless tobacco: Current    Types: Chew  Vaping Use   Vaping Use: Never used  Substance and Sexual Activity   Alcohol use: Yes    Alcohol/week: 1.0 standard drink    Types: 1 Standard drinks or equivalent per week    Comment: occassional   Drug use: No   Sexual activity: Yes  Other Topics Concern   Not on file  Social History Narrative   Not on file   Social Determinants of Health   Financial Resource Strain: Not on file  Food Insecurity: Not on file  Transportation Needs: Not on file  Physical Activity: Not on file  Stress: Not on file  Social Connections: Not on file    Allergies:  Allergies  Allergen Reactions   Flomax [Tamsulosin Hcl] Rash    Metabolic Disorder Labs: Lab Results  Component Value Date   HGBA1C 5.4 02/11/2020   MPG 108 02/11/2020   Lab Results  Component Value Date   PROLACTIN 10.7 06/06/2020   PROLACTIN 6.5 07/15/2019   Lab Results  Component Value Date   CHOL 169 05/26/2020   TRIG 92 05/26/2020   HDL 37 (L) 05/26/2020   CHOLHDL  4.6 05/26/2020   LDLCALC 115 (H) 05/26/2020   LDLCALC 196 (H) 02/11/2020   Lab Results  Component Value Date   TSH 1.49 06/24/2019   TSH 1.830 07/09/2018    Therapeutic Level Labs: No results found for: LITHIUM No results found for: VALPROATE No components found for:  CBMZ  Current Medications: Current Outpatient Medications  Medication Sig Dispense Refill   rosuvastatin (CRESTOR) 5 MG tablet Take 1 tablet (5 mg total) by mouth daily. 90 tablet 0   zaleplon (SONATA) 5 MG capsule TAKE 1 CAPSULE (5 MG TOTAL) BY MOUTH AT BEDTIME AS NEEDED FOR SLEEP. - STOP ZOLPIDEM 30 capsule 1   Armodafinil 150 MG tablet Take 1 tablet (150  mg total) by mouth daily. 30 tablet 2   OLANZapine-FLUoxetine (SYMBYAX) 3-25 MG capsule Take 1 capsule by mouth as directed. Take every other night by mouth - tapering off 90 capsule 0   No current facility-administered medications for this visit.     Musculoskeletal: Strength & Muscle Tone: within normal limits Gait & Station: normal Patient leans: N/A  Psychiatric Specialty Exam: Review of Systems  Psychiatric/Behavioral:  Negative for agitation, behavioral problems, confusion, decreased concentration, dysphoric mood, hallucinations, self-injury, sleep disturbance and suicidal ideas. The patient is not nervous/anxious and is not hyperactive.   All other systems reviewed and are negative.  Blood pressure 130/84, pulse 71, temperature (!) 97.3 F (36.3 C), temperature source Temporal, weight 222 lb (100.7 kg).Body mass index is 30.96 kg/m.  General Appearance: Casual  Eye Contact:  Good  Speech:  Clear and Coherent  Volume:  Normal  Mood:  Euthymic  Affect:  Congruent  Thought Process:  Goal Directed and Descriptions of Associations: Intact  Orientation:  Full (Time, Place, and Person)  Thought Content: Logical   Suicidal Thoughts:  No  Homicidal Thoughts:  No  Memory:  Immediate;   Fair Recent;   Fair Remote;   Fair  Judgement:  Fair  Insight:  Fair  Psychomotor Activity:  Normal  Concentration:  Concentration: Fair and Attention Span: Fair  Recall:  [employer] of Knowledge: Fair  Language: Fair  Akathisia:  No  Handed:  Right  AIMS (if indicated): done  Assets:  Communication Skills Desire for [place] Talents/Skills Transportation Vocational/Educational  ADL's:  Intact  Cognition: WNL  Sleep:  Fair   Screenings: [place] Office Visit from 08/16/2020 in [facility] Total Score 0      GAD-7    [place] Office Visit from 08/16/2020 in [facility]  Total GAD-7 Score 0      PHQ2-9    [place] Visit from 08/16/2020 in [facility] [place]  PHQ-2 Total Score 0  PHQ-9 Total Score 0      [place] Office Visit from 08/16/2020 in [place] No Risk        Assessment and Plan: MANDELL PANGBORN is a 45 year old Caucasian male, married, employed, lives in [place], has a history of bipolar disorder in remission, shiftwork disorder, primary insomnia, hypersomnia was evaluated in office today.  Patient is currently stable.  Plan as noted below.  Plan Bipolar disorder in remission Patient is interested in tapering off of the Symbyax.  Advised to take Symbyax 3-25 mg p.o. every other day. Patient to monitor her symptoms closely.  Shift work sleep disorder-improving Nuvigil 150 mg p.o. daily Sonata 5 mg  p.o. nightly  Elevated liver function -unstable Reviewed and discussed liver function enzymes-ALT-elevated at 64 dated 05/26/2020. Discussed with patient the effect of certain medications on his liver function. We will consider repeating liver function. However patient in the meantime to contact primary care provider.  He is also on Crestor which likely could also be contributing to this.   Follow-up in clinic in 2 to 3 months or sooner if needed.  This note was generated in part or whole with voice recognition software. Voice recognition is usually quite accurate but there are transcription errors that can and very often do occur. I apologize for any typographical errors that were not detected and corrected.         Ursula Alert, MD 08/17/2020, 5:46 PM

## 2020-09-16 ENCOUNTER — Other Ambulatory Visit: Payer: Self-pay | Admitting: Psychiatry

## 2020-09-16 DIAGNOSIS — G4726 Circadian rhythm sleep disorder, shift work type: Secondary | ICD-10-CM

## 2020-10-04 ENCOUNTER — Other Ambulatory Visit: Payer: Self-pay | Admitting: Psychiatry

## 2020-10-04 DIAGNOSIS — F3178 Bipolar disorder, in full remission, most recent episode mixed: Secondary | ICD-10-CM

## 2020-10-07 ENCOUNTER — Encounter: Payer: Self-pay | Admitting: Internal Medicine

## 2020-10-16 ENCOUNTER — Other Ambulatory Visit: Payer: Self-pay | Admitting: Internal Medicine

## 2020-10-16 DIAGNOSIS — E782 Mixed hyperlipidemia: Secondary | ICD-10-CM

## 2020-10-16 NOTE — Telephone Encounter (Signed)
Requested medications are due for refill today yes  Requested medications are on the active medication list yes  Last refill 07/17/20  Last visit 02/11/20 (at [facility])  Future visit scheduled No  Notes to clinic Was seen in [place] practice in Dec. No upcoming appt with [name] at [facility], please assess.

## 2020-10-17 ENCOUNTER — Encounter: Payer: Self-pay | Admitting: Internal Medicine

## 2020-10-19 ENCOUNTER — Other Ambulatory Visit: Payer: Self-pay

## 2020-10-19 ENCOUNTER — Encounter: Payer: Self-pay | Admitting: Internal Medicine

## 2020-10-19 ENCOUNTER — Telehealth: Payer: Managed Care, Other (non HMO) | Admitting: Internal Medicine

## 2020-10-19 DIAGNOSIS — N522 Drug-induced erectile dysfunction: Secondary | ICD-10-CM | POA: Diagnosis not present

## 2020-10-19 DIAGNOSIS — N529 Male erectile dysfunction, unspecified: Secondary | ICD-10-CM | POA: Insufficient documentation

## 2020-10-19 MED ORDER — SILDENAFIL CITRATE 50 MG PO TABS: 25.0000 mg | ORAL_TABLET | Freq: Every day | ORAL | 5 refills | Status: DC | PRN

## 2020-10-19 NOTE — Patient Instructions (Signed)
Erectile Dysfunction Erectile dysfunction (ED) is the inability to get or keep an erection in order to have sexual intercourse. ED is considered a symptom of an underlying disorder and not considered a disease. Erectile dysfunction may include: Inability to get an erection. Lack of enough hardness of the erection to allow penetration. Loss of the erection before sex is finished. What are the causes? This condition may be caused by: Certain medicines, such as: Pain relievers. Antihistamines. Antidepressants. Blood pressure medicines. Water pills (diuretics). Ulcer medicines. Muscle relaxants. Drugs. Excessive drinking. Psychological causes, such as: Anxiety. Depression. Sadness. Exhaustion. Performance fear. Stress. Physical causes, such as: Artery problems. This may include diabetes, smoking, liver disease, or atherosclerosis. High blood pressure. Hormonal problems, such as low testosterone. Obesity. Nerve problems. This may include back or pelvic injuries, diabetes mellitus, multiple sclerosis, or Parkinson's disease. What are the signs or symptoms? Symptoms of this condition include: Inability to get an erection. Lack of enough hardness of the erection to allow penetration. Loss of the erection before sex is finished. Normal erections at some times, but with frequent unsatisfactory episodes. Low sexual satisfaction in either partner due to erection problems. A curved penis occurring with erection. The curve may cause pain or the penis may be too curved to allow for intercourse. Never having nighttime erections. How is this diagnosed? This condition is often diagnosed by: Performing a physical exam to find other diseases or specific problems with the penis. Asking you detailed questions about the problem. Performing blood tests to check for diabetes mellitus or to measure hormone levels. Performing other tests to check for underlying health conditions. Performing an  ultrasound exam to check for scarring. Performing a test to check blood flow to the penis. Doing a sleep study at home to measure nighttime erections. How is this treated? This condition may be treated by: Medicine taken by mouth to help you achieve an erection (oral medicine). Hormone replacement therapy to replace low testosterone levels. Medicine that is injected into the penis. Your health care provider may instruct you how to give yourself these injections at home. Vacuum pump. This is a pump with a ring on it. The pump and ring are placed on the penis and used to create pressure that helps the penis become erect. Penile implant surgery. In this procedure, you may receive: An inflatable implant. This consists of cylinders, a pump, and a reservoir. The cylinders can be inflated with a fluid that helps to create an erection, and they can be deflated after intercourse. A semi-rigid implant. This consists of two silicone rubber rods. The rods provide some rigidity. They are also flexible, so the penis can both curve downward in its normal position and become straight for sexual intercourse. Blood vessel surgery, to improve blood flow to the penis. During this procedure, a blood vessel from a different part of the body is placed into the penis to allow blood to flow around (bypass) damaged or blocked blood vessels. Lifestyle changes, such as exercising more, losing weight, and quitting smoking. Follow these instructions at home: Medicines  Take over-the-counter and prescription medicines only as told by your health care provider. Do not increase the dosage without first discussing it with your health care provider. If you are using self-injections, perform injections as directed by your health care provider. Make sure to avoid any veins that are on the surface of the penis. After giving an injection, apply pressure to the injection site for 5 minutes.  General instructions Exercise regularly, as  directed by your health care provider. Work with your health care provider to lose weight, if needed. Do not use any products that contain nicotine or tobacco, such as cigarettes and e-cigarettes. If you need help quitting, ask your health care provider. Before using a vacuum pump, read the instructions that come with the pump and discuss any questions with your health care provider. Keep all follow-up visits as told by your health care provider. This is important. Contact a health care provider if: You feel nauseous. You vomit. Get help right away if: You are taking oral or injectable medicines and you have an erection that lasts longer than 4 hours. If your health care provider is unavailable, go to the nearest emergency room for evaluation. An erection that lasts much longer than 4 hours can result in permanent damage to your penis. You have severe pain in your groin or abdomen. You develop redness or severe swelling of your penis. You have redness spreading up into your groin or lower abdomen. You are unable to urinate. You experience chest pain or a rapid heart beat (palpitations) after taking oral medicines. Summary Erectile dysfunction (ED) is the inability to get or keep an erection during sexual intercourse. This problem can usually be treated successfully. This condition is diagnosed based on a physical exam, your symptoms, and tests to determine the cause. Treatment varies depending on the cause and may include medicines, hormone therapy, surgery, or a vacuum pump. You may need follow-up visits to make sure that you are using your medicines or devices correctly. Get help right away if you are taking or injecting medicines and you have an erection that lasts longer than 4 hours. This information is not intended to replace advice given to you by your health care provider. Make sure you discuss any questions you have with your healthcare provider. Document Revised: 02/01/2020 Document  Reviewed: 10/29/2019 Elsevier Patient Education  [employer].

## 2020-10-19 NOTE — Progress Notes (Signed)
Virtual Visit via Video Note  I connected with Dale Davis on 10/19/20 at 11:20 AM EDT by a video enabled telemedicine application and verified that I am speaking with the correct person using two identifiers.  Location: Patient: In his car Provider: Office  Persons participating in this video call: [name], NP and [name]   I discussed the limitations of evaluation and management by telemedicine and the availability of in person appointments. The patient expressed understanding and agreed to proceed.  History of Present Illness:  Patient has concerns about erectile dysfunction. He reports this started a few months ago.  He reports difficulty initiating, maintaining an fullness of his erection.  He is not sure if this is a problem with physical direction or just that he is getting older.  He has tried over-the-counter medications with minimal results.  He is not following with urology.   Past Medical History:  Diagnosis Date   Anxiety    Depression    Kidney stones    Kidney stones     Current Outpatient Medications  Medication Sig Dispense Refill   Armodafinil 150 MG tablet Take 1 tablet (150 mg total) by mouth daily. 30 tablet 2   OLANZapine-FLUoxetine (SYMBYAX) 3-25 MG capsule TAKE 1 CAPSULE BY MOUTH EVERY DAY IN THE EVENING 90 capsule 0   rosuvastatin (CRESTOR) 5 MG tablet TAKE 1 TABLET(5 MG) BY MOUTH DAILY 90 tablet 0   zaleplon (SONATA) 5 MG capsule TAKE 1 CAPSULE (5 MG TOTAL) BY MOUTH AT BEDTIME AS NEEDED FOR SLEEP. - STOP ZOLPIDEM 30 capsule 1   No current facility-administered medications for this visit.    Allergies  Allergen Reactions   Flomax [Tamsulosin Hcl] Rash    Family History  Problem Relation Age of Onset   Kidney failure Father     Social History   Socioeconomic History   Marital status: Married    Spouse name: [name]   Number of children: 0   Years of education: Not on file   Highest education level: Not on file  Occupational  History   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  Tobacco Use   Smoking status: Never   Smokeless tobacco: Current    Types: Chew  Vaping Use   Vaping Use: Never used  Substance and Sexual Activity   Alcohol use: Yes    Alcohol/week: 1.0 standard drink    Types: 1 Standard drinks or equivalent per week    Comment: occassional   Drug use: No   Sexual activity: Yes  Other Topics Concern   Not on file  Social History Narrative   Not on file   Social Determinants of Health   Financial Resource Strain: Not on file  Food Insecurity: Not on file  Transportation Needs: Not on file  Physical Activity: Not on file  Stress: Not on file  Social Connections: Not on file  Intimate Partner Violence: Not on file     Constitutional: Denies fever, malaise, fatigue, headache or abrupt weight changes.  Respiratory: Denies difficulty breathing, shortness of Davis, cough or sputum production.   Cardiovascular: Denies chest pain, chest tightness, palpitations or swelling in the hands or feet.  Gastrointestinal: Denies abdominal pain, bloating, constipation, diarrhea or blood in the stool.  GU: Patient reports erectile dysfunction.  Denies urgency, frequency, pain with urination, burning sensation, blood in urine, odor or discharge. Psych: Patient reports stress, anxiety and depression.  Denies  SI/HI.  No other specific complaints in  a complete review of systems (except as listed in HPI above).    Observations/Objective:  Wt Readings from Last 3 Encounters:  02/11/20 229 lb (103.9 kg)  12/13/19 237 lb (107.5 kg)  07/15/19 220 lb (99.8 kg)    General: Appears his stated age, obese, in NAD. Pulmonary/Chest: Normal effort. No respiratory distress.  Neurological: Alert and oriented. Psychiatric: Mood and affect normal. Behavior is normal. Judgment and thought content normal.     BMET    Component Value Date/Time   NA 142 05/26/2020 0814   K 4.4  05/26/2020 0814   CL 103 05/26/2020 0814   CO2 25 05/26/2020 0814   GLUCOSE 107 (H) 05/26/2020 0814   GLUCOSE 70 02/11/2020 1505   BUN 24 05/26/2020 0814   CREATININE 1.05 05/26/2020 0814   CREATININE 1.08 02/11/2020 1505   CALCIUM 9.8 05/26/2020 0814   GFRNONAA 77 07/09/2018 0836   GFRAA 89 07/09/2018 0836    Lipid Panel     Component Value Date/Time   CHOL 169 05/26/2020 0814   TRIG 92 05/26/2020 0814   HDL 37 (L) 05/26/2020 0814   CHOLHDL 4.6 05/26/2020 0814   CHOLHDL 7.7 (H) 02/11/2020 1505   LDLCALC 115 (H) 05/26/2020 0814   LDLCALC 196 (H) 02/11/2020 1505    CBC    Component Value Date/Time   WBC 5.4 02/11/2020 1505   RBC 4.67 02/11/2020 1505   HGB 14.7 02/11/2020 1505   HGB 14.0 07/09/2018 0836   HCT 41.5 02/11/2020 1505   HCT 41.0 07/09/2018 0836   PLT 203 02/11/2020 1505   PLT 174 07/09/2018 0836   MCV 88.9 02/11/2020 1505   MCV 91 07/09/2018 0836   MCH 31.5 02/11/2020 1505   MCHC 35.4 02/11/2020 1505   RDW 13.7 02/11/2020 1505   RDW 13.2 07/09/2018 0836   LYMPHSABS 0.8 07/09/2018 0836   MONOABS 0.8 04/18/2015 1930   EOSABS 0.1 07/09/2018 0836   BASOSABS 0.0 07/09/2018 0836    Hgb A1C Lab Results  Component Value Date   HGBA1C 5.4 02/11/2020       Assessment and Plan:   Follow Up Instructions:    I discussed the assessment and treatment plan with the patient. The patient was provided an opportunity to ask questions and all were answered. The patient agreed with the plan and demonstrated an understanding of the instructions.   The patient was advised to call back or seek an in-person evaluation if the symptoms worsen or if the condition fails to improve as anticipated.   [name], NP

## 2020-10-19 NOTE — Assessment & Plan Note (Signed)
Will trial Sildenafil 50 mg daily Discussed risk of priapism, will need to proceed to ER if this occurs

## 2020-11-16 ENCOUNTER — Encounter: Payer: Self-pay | Admitting: Psychiatry

## 2020-11-16 ENCOUNTER — Telehealth (INDEPENDENT_AMBULATORY_CARE_PROVIDER_SITE_OTHER): Payer: 59 | Admitting: Psychiatry

## 2020-11-16 ENCOUNTER — Other Ambulatory Visit: Payer: Self-pay

## 2020-11-16 DIAGNOSIS — G4726 Circadian rhythm sleep disorder, shift work type: Secondary | ICD-10-CM

## 2020-11-16 DIAGNOSIS — F3178 Bipolar disorder, in full remission, most recent episode mixed: Secondary | ICD-10-CM | POA: Diagnosis not present

## 2020-11-16 MED ORDER — ARMODAFINIL 150 MG PO TABS: 75.0000 mg | ORAL_TABLET | Freq: Every day | ORAL | 1 refills | Status: DC

## 2020-11-16 MED ORDER — HYDROXYZINE PAMOATE 25 MG PO CAPS: 25.0000 mg | ORAL_CAPSULE | Freq: Every evening | ORAL | 1 refills | Status: DC | PRN

## 2020-11-16 MED ORDER — ZALEPLON 5 MG PO CAPS: 5.0000 mg | ORAL_CAPSULE | Freq: Every evening | ORAL | 1 refills | Status: DC | PRN

## 2020-11-16 NOTE — Progress Notes (Signed)
Virtual Visit via Video Note  I connected with [name] on 11/16/20 at  9:00 AM EDT by a video enabled telemedicine application and verified that I am speaking with the correct person using two identifiers.  Location Provider Location : [facility] Patient Location : Work  Participants: Patient , Provider    I discussed the limitations of evaluation and management by telemedicine and the availability of in person appointments. The patient expressed understanding and agreed to proceed.   I discussed the assessment and treatment plan with the patient. The patient was provided an opportunity to ask questions and all were answered. The patient agreed with the plan and demonstrated an understanding of the instructions.   The patient was advised to call back or seek an in-person evaluation if the symptoms worsen or if the condition fails to improve as anticipated.   [place] MD OP Progress Note  11/16/2020 9:17 AM Dale Davis  MRN:  [id number]  Chief Complaint:  Chief Complaint   Follow-up; Anxiety; Depression    HPI: Dale Davis is a 45 year old Caucasian male, married, employed, lives in [place], has a history of bipolar disorder, shiftwork disorder, insomnia was evaluated by telemedicine today.  Patient today reports he is currently doing well with regards to his mood.  Patient reports he has started tapering off of the Symbyax.  He currently takes it every 2 days or so.  Patient reports after 2 days he seems to have sleep problems when he does not take the Symbyax.  He does take the [facility] when he does not take the Symbyax.  That also does not seem to help the nights that he does not take the Symbyax to sleep.  He has racing thoughts some nights.  He however reports he wants to get off of these medications.  He has not noticed any depression, mood swings since he is tapering himself down.  He is also trying to get himself off of the Nuvigil.  He currently takes only 1/2 tablet of  the 150 mg.  That seems to be effective.  Patient believes that if he can get himself off of the Symbyax he may not even need the Nuvigil.  Patient believes his sleepiness during the day is more so medication induced.  Patient reports work is going well.  Denies any suicidality, homicidality or perceptual disturbances.  Patient denies any other concerns today.    Visit Diagnosis:    ICD-10-CM   1. 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   big toe    Family Psychiatric History: Reviewed family psychiatric history from progress note on 06/26/2018  Family History:  Family History  Problem Relation Age of Onset   Kidney failure Father     Social History: Reviewed social history from progress note on 06/26/2018 Social History   Socioeconomic History   Marital status: Married  Spouse name: Dale Davis   Number of children: 0   Years of education: Not on file   Highest education level: Not on file  Occupational History   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  Tobacco Use   Smoking status: Never   Smokeless tobacco: Former    Types: Chew    Quit date: 06/2020  Vaping Use   Vaping Use: Never used  Substance and Sexual Activity   Alcohol use: Yes    Alcohol/week: 1.0 standard drink    Types: 1 Standard drinks or equivalent per week    Comment: occassional   Drug  use: No   Sexual activity: Yes  Other Topics Concern   Not on file  Social History Narrative   Not on file   Social Determinants of Health   Financial Resource Strain: Not on file  Food Insecurity: Not on file  Transportation Needs: Not on file  Physical Activity: Not on file  Stress: Not on file  Social Connections: Not on file    Allergies:  Allergies  Allergen Reactions   Flomax [Tamsulosin Hcl] Rash    Metabolic Disorder Labs: Lab Results  Component Value Date   HGBA1C 5.4 02/11/2020   MPG 108 02/11/2020   Lab Results  Component Value Date   PROLACTIN 10.7 06/06/2020   PROLACTIN 6.5 07/15/2019   Lab Results  Component Value Date   CHOL 169 05/26/2020   TRIG 92 05/26/2020   HDL 37 (L) 05/26/2020   CHOLHDL 4.6 05/26/2020   LDLCALC 115 (H) 05/26/2020   LDLCALC 196 (H) 02/11/2020   Lab Results  Component Value Date   TSH 1.49 06/24/2019   TSH 1.830 07/09/2018    Therapeutic Level Labs: No results found for: LITHIUM No results found for: VALPROATE No components found for:  CBMZ  Current Medications: Current Outpatient Medications  Medication Sig Dispense Refill   hydrOXYzine (VISTARIL) 25 MG capsule Take 1-2 capsules (25-50 mg total) by mouth at bedtime as needed. For severe anxiety , racing thoughts, sleep 60 capsule 1   Armodafinil 150 MG tablet Take 0.5 tablets (75 mg total) by mouth daily. 15 tablet 1   OLANZapine-FLUoxetine (SYMBYAX) 3-25 MG capsule TAKE 1 CAPSULE BY MOUTH EVERY DAY IN THE EVENING (Patient taking differently: Take 1 capsule by mouth every other day.) 90 capsule 0   rosuvastatin (CRESTOR) 5 MG tablet TAKE 1 TABLET(5 MG) BY MOUTH DAILY 90 tablet 0   sildenafil (VIAGRA) 50 MG tablet Take 0.5-1 tablets (25-50 mg total) by mouth daily as needed for erectile dysfunction. 10 tablet 5   zaleplon (SONATA) 5 MG capsule Take 1 capsule (5 mg total) by mouth at bedtime as needed for sleep. 30 capsule 1   No current facility-administered  medications for this visit.     Musculoskeletal: Strength & Muscle Tone:  UTA Gait & Station:  Seated Patient leans: N/A  Psychiatric Specialty Exam: Review of Systems  Psychiatric/Behavioral:  Positive for sleep disturbance. Negative for agitation, behavioral problems, confusion, decreased concentration, dysphoric mood, hallucinations, self-injury and suicidal ideas. The patient is not nervous/anxious and is not hyperactive.   All other systems reviewed and are negative.  There were no vitals taken for this visit.There is no height or weight on file to calculate BMI.  General Appearance: Fairly Groomed  Eye Contact:  Good  Speech:  Normal Rate  Volume:  Normal  Mood:  Euthymic  Affect:  Congruent  Thought Process:  Goal Directed and Descriptions of  Associations: Intact  Orientation:  Full (Time, Place, and Person)  Thought Content: Logical   Suicidal Thoughts:  No  Homicidal Thoughts:  No  Memory:  Immediate;   Fair Recent;   Fair Remote;   Fair  Judgement:  Fair  Insight:  Fair  Psychomotor Activity:  Normal  Concentration:  Concentration: Fair and Attention Span: Fair  Recall:  [employer] of Knowledge: Fair  Language: Fair  Akathisia:  No  Handed:  Right  AIMS (if indicated): done  Assets:  Communication Skills Desire for Improvement Housing Social Support  ADL's:  Intact  Cognition: WNL  Sleep:   restless when he does not take symbyax   Screenings: [place] Office Visit from 08/16/2020 in [place] Total Score [place] Office Visit from 08/16/2020 in [place]  Total GAD-7 Score 0      PHQ2-9    [place] Visit from 08/16/2020 in [place]  PHQ-2 Total Score 0  PHQ-9 Total Score 0      [place] Office Visit from 08/16/2020 in [place] No Risk         Assessment and Plan: Dale Davis is a 45 year old Caucasian male, married, employed, lives in [place], has a history of bipolar disorder in remission, shiftwork disorder, primary insomnia, hypersomnia was evaluated by telemedicine today.  Patient is currently tapering himself off of the Symbyax as well as Nuvigil.  Denies any significant mood symptoms however does report sleep problems when he is not on the Symbyax.  Discussed plan as noted below.  Plan Bipolar disorder in remission Symbyax 3-25 mg p.o. every 2 days.  Patient is currently tapering himself off. Patient to monitor himself for worsening mood symptoms, sleep problems. Start hydroxyzine 25-50 mg p.o. nightly as needed for racing thoughts, sleep since he is currently tapering off of Symbyax.  He could take it the nights that he does not take the Symbyax.  Shiftwork sleep disorder-stable Nuvigil 75 mg p.o. daily Sonata 5 mg p.o. nightly  Follow-up in clinic in 4 weeks or sooner if needed.  This note was generated in part or whole with voice recognition software. Voice recognition is usually quite accurate but there are transcription errors that can and very often do occur. I apologize for any typographical errors that were not detected and corrected.       Ursula Alert, MD 11/16/2020, 9:17 AM

## 2020-12-13 ENCOUNTER — Other Ambulatory Visit: Payer: Self-pay

## 2020-12-13 ENCOUNTER — Encounter: Payer: Self-pay | Admitting: Psychiatry

## 2020-12-13 ENCOUNTER — Telehealth (INDEPENDENT_AMBULATORY_CARE_PROVIDER_SITE_OTHER): Payer: 59 | Admitting: Psychiatry

## 2020-12-13 DIAGNOSIS — F3178 Bipolar disorder, in full remission, most recent episode mixed: Secondary | ICD-10-CM | POA: Diagnosis not present

## 2020-12-13 DIAGNOSIS — G4726 Circadian rhythm sleep disorder, shift work type: Secondary | ICD-10-CM | POA: Diagnosis not present

## 2020-12-13 MED ORDER — OLANZAPINE 5 MG PO TABS
2.5000 mg | ORAL_TABLET | Freq: Every evening | ORAL | 1 refills | Status: DC | PRN
Start: 2020-12-13 — End: 2021-02-02

## 2020-12-13 NOTE — Progress Notes (Signed)
Virtual Visit via Video Note  I connected with Dale Davis on 12/13/20 at 10:00 AM EDT by a video enabled telemedicine application and verified that I am speaking with the correct person using two identifiers.  Location Provider Location : [facility] Patient Location : Car  Participants: Patient , Provider   I discussed the limitations of evaluation and management by telemedicine and the availability of in person appointments. The patient expressed understanding and agreed to proceed.    I discussed the assessment and treatment plan with the patient. The patient was provided an opportunity to ask questions and all were answered. The patient agreed with the plan and demonstrated an understanding of the instructions.   The patient was advised to call back or seek an in-person evaluation if the symptoms worsen or if the condition fails to improve as anticipated.    [place] MD OP Progress Note  12/13/2020 10:11 AM Dale Davis  MRN:  [id number]  Chief Complaint:  Chief Complaint   Follow-up; Anxiety    HPI: Dale Davis is a 45 year old Caucasian male, married, employed, lives in [place], has a history of bipolar disorder, shiftwork disorder, insomnia was evaluated by telemedicine today.  Patient today reports overall mood symptoms as good.  Denies any significant mood lability, anxiety or sadness.  He is currently tapering himself off of the Symbyax.  Currently takes the Symbyax every fifth night or so.  He takes it only when he feels he cannot sleep and that has been working okay.  He otherwise takes hydroxyzine as needed which helps to sleep.  He takes the Nuvigil half tablet every few days and is trying to wean himself off of it.  So far he is tolerating that well.  Patient denies any suicidality, homicidality or perceptual disturbances.  Patient reports work is going well.  Patient denies any other concerns today.  Visit Diagnosis:    ICD-10-CM   1. 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   big toe    Family Psychiatric History: Reviewed family psychiatric history from progress note on 06/26/2018  Family History:  Family History  Problem Relation Age of Onset   Kidney failure Father     Social History: Reviewed social history from progress note on 06/26/2018 Social History   Socioeconomic History   Marital status: Married    Spouse name: Dale Davis   Number of children: 0   Years of education: Not on file   Highest education level: Not on file  Occupational History   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  Tobacco Use   Smoking status: Never   Smokeless tobacco: Former    Types: Chew    Quit date: 06/2020  Vaping Use   Vaping Use: Never used  Substance and Sexual Activity   Alcohol use: Yes    Alcohol/week: 1.0 standard drink    Types: 1 Standard drinks  or equivalent per week    Comment: occassional   Drug use: No   Sexual activity: Yes  Other Topics Concern   Not on file  Social History Narrative   Not on file   Social Determinants of Health   Financial Resource Strain: Not on file  Food Insecurity: Not on file  Transportation Needs: Not on file  Physical Activity: Not on file  Stress: Not on file  Social Connections: Not on file    Allergies:  Allergies  Allergen Reactions   Flomax [Tamsulosin Hcl] Rash    Metabolic Disorder  Labs: Lab Results  Component Value Date   HGBA1C 5.4 02/11/2020   MPG 108 02/11/2020   Lab Results  Component Value Date   PROLACTIN 10.7 06/06/2020   PROLACTIN 6.5 07/15/2019   Lab Results  Component Value Date   CHOL 169 05/26/2020   TRIG 92 05/26/2020   HDL 37 (L) 05/26/2020   CHOLHDL 4.6 05/26/2020   LDLCALC 115 (H) 05/26/2020   LDLCALC 196 (H) 02/11/2020   Lab Results  Component Value Date   TSH 1.49 06/24/2019   TSH 1.830 07/09/2018    Therapeutic Level Labs: No results found for: LITHIUM No results found for: VALPROATE No components found for:  CBMZ  Current Medications: Current Outpatient Medications  Medication Sig Dispense Refill   OLANZapine (ZYPREXA) 5 MG tablet Take 0.5 tablets (2.5 mg total) by mouth at bedtime as needed. For racing thoughts, mood,sleep 45 tablet 1   Armodafinil 150 MG tablet Take 0.5 tablets (75 mg total) by mouth daily. 15 tablet 1   hydrOXYzine (VISTARIL) 25 MG capsule Take 1-2 capsules (25-50 mg total) by mouth at bedtime as needed. For severe anxiety , racing thoughts, sleep 60 capsule 1   rosuvastatin (CRESTOR) 5 MG tablet TAKE 1 TABLET(5 MG) BY MOUTH DAILY 90 tablet 0   No current facility-administered medications for this visit.     Musculoskeletal: Strength & Muscle Tone:  UTA Gait & Station:  Seated Patient leans: N/A  Psychiatric Specialty Exam: Review of Systems  Psychiatric/Behavioral:  Negative for agitation, behavioral problems, confusion, decreased concentration, dysphoric mood, hallucinations, self-injury, sleep disturbance and suicidal ideas. The patient is not nervous/anxious and is not hyperactive.   All other systems reviewed and are negative.  There were no vitals taken for this visit.There is no height or weight on file to calculate BMI.  General Appearance: Casual  Eye Contact:  Good  Speech:  Clear and Coherent  Volume:  Normal  Mood:  Euthymic  Affect:  Full Range  Thought Process:  Goal Directed and  Descriptions of Associations: Intact  Orientation:  Full (Time, Place, and Person)  Thought Content: Logical   Suicidal Thoughts:  No  Homicidal Thoughts:  No  Memory:  Immediate;   Fair Recent;   Fair Remote;   Fair  Judgement:  Fair  Insight:  Fair  Psychomotor Activity:  Normal  Concentration:  Concentration: Fair and Attention Span: Fair  Recall:  [employer] of Knowledge: Fair  Language: Fair  Akathisia:  No  Handed:  Right  AIMS (if indicated): done  Assets:  Communication Skills Desire for Improvement Resilience Social Support Talents/Skills Transportation Vocational/Educational  ADL's:  Intact  Cognition: WNL  Sleep:  Fair   Screenings: [place] Office Visit from 08/16/2020 in [place] Total Score 0      GAD-7    Flowsheet Row Video Visit from 12/13/2020 in [place]  [facility] Office Visit from 08/16/2020 in [place]  Total GAD-7 Score 0 0      PHQ2-9    Flowsheet Row Video Visit from 12/13/2020 in [place] Office Visit from 08/16/2020 in [place]  PHQ-2 Total Score 0 0  PHQ-9 Total Score 0 0      Flowsheet Row Video Visit from 12/13/2020 in [place] Office Visit from 08/16/2020 in [place] No Risk No Risk        Assessment and Plan: Dale Davis is a 45 year old Caucasian male, married, employed, lives in [place], has a history of bipolar disorder in remission, shiftwork disorder, primary insomnia, hypersomnia was evaluated by telemedicine today.  Patient is currently tapering himself off of the Symbyax as well as Nuvigil and reports mood and sleep are stable.  Plan as noted below.  Plan Bipolar disorder in remission Will change Symbyax to olanzapine 2.5 mg p.o. nightly as needed for mood,  sleep. Hydroxyzine 25-50 mg p.o. nightly as needed for racing thoughts and sleep   Shiftwork disorder-stable Discontinue Sonata for noncompliance. Nuvigil 75 mg p.o. daily  Follow-up in clinic in 2 months or sooner if needed.  This note was generated in part or whole with voice recognition software. Voice recognition is usually quite accurate but there are transcription errors that can and very often do occur. I apologize for any typographical errors that were not detected and corrected.     Ursula Alert, MD 12/14/2020, 8:13 AM

## 2020-12-15 ENCOUNTER — Telehealth: Payer: Self-pay

## 2020-12-15 NOTE — Telephone Encounter (Signed)
received fax [facility] that a prior auth was needed on the armodafinil 150mg 

## 2020-12-15 NOTE — Telephone Encounter (Signed)
prior Dale Davis was approved from 12-15-20 to 06-15-21

## 2020-12-15 NOTE — Telephone Encounter (Signed)
went online to [email or web address] and submitted the prior auth for the armodafini - pending

## 2021-01-01 ENCOUNTER — Telehealth: Payer: Self-pay

## 2021-01-01 DIAGNOSIS — F3178 Bipolar disorder, in full remission, most recent episode mixed: Secondary | ICD-10-CM

## 2021-01-01 NOTE — Telephone Encounter (Signed)
also received fax requesting a refill on the hydroyzine 25mg  # 60  last filled 12-11-20

## 2021-01-01 NOTE — Telephone Encounter (Signed)
received fax requesting olanzapine-fluoxetine 3-25mg ..  take 1 capsule by mouth every evening #90

## 2021-01-02 MED ORDER — HYDROXYZINE PAMOATE 25 MG PO CAPS: 25.0000 mg | ORAL_CAPSULE | Freq: Every evening | ORAL | 1 refills | Status: DC | PRN

## 2021-01-02 NOTE — Telephone Encounter (Signed)
I have sent hydroxyzine to pharmacy. 

## 2021-01-17 ENCOUNTER — Other Ambulatory Visit: Payer: Self-pay | Admitting: Psychiatry

## 2021-01-17 DIAGNOSIS — G4726 Circadian rhythm sleep disorder, shift work type: Secondary | ICD-10-CM

## 2021-02-02 ENCOUNTER — Other Ambulatory Visit: Payer: Self-pay

## 2021-02-02 ENCOUNTER — Encounter: Payer: Self-pay | Admitting: Internal Medicine

## 2021-02-02 ENCOUNTER — Ambulatory Visit (INDEPENDENT_AMBULATORY_CARE_PROVIDER_SITE_OTHER): Payer: Managed Care, Other (non HMO) | Admitting: Internal Medicine

## 2021-02-02 VITALS — BP 121/83 | HR 88 | Temp 97.7°F | Resp 18 | Ht 71.0 in | Wt 223.8 lb

## 2021-02-02 DIAGNOSIS — E6609 Other obesity due to excess calories: Secondary | ICD-10-CM | POA: Diagnosis not present

## 2021-02-02 DIAGNOSIS — E663 Overweight: Secondary | ICD-10-CM | POA: Insufficient documentation

## 2021-02-02 DIAGNOSIS — E66811 Obesity, class 1: Secondary | ICD-10-CM | POA: Insufficient documentation

## 2021-02-02 DIAGNOSIS — N522 Drug-induced erectile dysfunction: Secondary | ICD-10-CM

## 2021-02-02 DIAGNOSIS — Z0001 Encounter for general adult medical examination with abnormal findings: Secondary | ICD-10-CM | POA: Diagnosis not present

## 2021-02-02 DIAGNOSIS — E785 Hyperlipidemia, unspecified: Secondary | ICD-10-CM | POA: Insufficient documentation

## 2021-02-02 DIAGNOSIS — E7800 Pure hypercholesterolemia, unspecified: Secondary | ICD-10-CM | POA: Diagnosis not present

## 2021-02-02 DIAGNOSIS — F3178 Bipolar disorder, in full remission, most recent episode mixed: Secondary | ICD-10-CM | POA: Diagnosis not present

## 2021-02-02 DIAGNOSIS — Z6829 Body mass index (BMI) 29.0-29.9, adult: Secondary | ICD-10-CM | POA: Insufficient documentation

## 2021-02-02 DIAGNOSIS — Z1211 Encounter for screening for malignant neoplasm of colon: Secondary | ICD-10-CM

## 2021-02-02 DIAGNOSIS — Z6831 Body mass index (BMI) 31.0-31.9, adult: Secondary | ICD-10-CM | POA: Diagnosis not present

## 2021-02-02 DIAGNOSIS — G4726 Circadian rhythm sleep disorder, shift work type: Secondary | ICD-10-CM

## 2021-02-02 NOTE — Progress Notes (Signed)
Subjective:    Patient ID: Dale Davis, male    DOB: [birth year], 45 y.o.   MRN: [id number]  HPI  Pt presents to the clinic today for his annual exam. He is also due to follow up chronic conditions.  Bipolar Depression: Chronic, managed on Hydroxyzine and Armodafinil. He reports he is weaning himself off the Armodafinil. He follows with psychiatry. He is not seeing a therapist. He denies SI/HI.  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 He no longer takes Ambien as needed with good relief of symptoms. There is no sleep study on file.   ED: He has difficulty maintaining an erection. He is not taking any medications for this at this time.  HLD: His last LDL was 115, triglycerides 92, 01/2020. He is not currently taking Rosuvastatin as prescribed, 1 tab every other day. He does not consume a low fat diet.  Flu: never Tetanus: 10/2015 Covid: Pfizer x 2 Colon screening: never Vision screening: as needed Dentist: biannually  Diet: He does eat meat. He consumes some fruits and veggies. He does eat some fried foods. He drinks mostly soda, coffee. Exercise: None  Review of Systems     Past Medical History:  Diagnosis Date   Anxiety    Depression    Kidney stones    Kidney stones     Current Outpatient Medications  Medication Sig Dispense Refill   Armodafinil 150 MG tablet TAKE 0.5 TABLETS (75 MG TOTAL) BY MOUTH DAILY. 15 tablet 1   hydrOXYzine (VISTARIL) 25 MG capsule Take 1-2 capsules (25-50 mg total) by mouth at bedtime as needed. For severe anxiety , racing thoughts, sleep 60 capsule 1   No current facility-administered medications for this visit.    Allergies  Allergen Reactions   Flomax [Tamsulosin Hcl] Rash    Family History  Problem Relation Age of Onset   Kidney failure Father     Social History   Socioeconomic History   Marital status: Married    Spouse name: [name]   Number of children: 0   Years of education: Not on file   Highest  education level: Not on file  Occupational History   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  Tobacco Use   Smoking status: Never   Smokeless tobacco: Former    Types: Chew    Quit date: 06/2020  Vaping Use   Vaping Use: Never used  Substance and Sexual Activity   Alcohol use: Yes    Alcohol/week: 1.0 standard drink    Types: 1 Standard drinks or equivalent per week    Comment: occassional   Drug use: No   Sexual activity: Yes  Other Topics Concern   Not on file  Social History Narrative   Not on file   Social Determinants of Health   Financial Resource Strain: Not on file  Food Insecurity: Not on file  Transportation Needs: Not on file  Physical Activity: Not on file  Stress: Not on file  Social Connections: Not on file  Intimate Partner Violence: Not on file     Constitutional: Denies fever, malaise, fatigue, headache or abrupt weight changes.  HEENT: Denies eye pain, eye redness, ear pain, ringing in the ears, wax buildup, runny nose, nasal congestion, bloody nose, or sore throat. Respiratory: Denies difficulty breathing, shortness of breath, cough or sputum production.   Cardiovascular: Denies chest pain, chest tightness, palpitations or swelling in the hands or feet.  Gastrointestinal: Denies abdominal pain,  bloating, constipation, diarrhea or blood in the stool.  GU: Pt reports erectile dysfunction. Denies urgency, frequency, pain with urination, burning sensation, blood in urine, odor or discharge. Musculoskeletal: Denies decrease in range of motion, difficulty with gait, muscle pain or joint pain and swelling.  Skin: Denies redness, rashes, lesions or ulcercations.  Neurological: Pt reports insomnia. Denies dizziness, difficulty with memory, difficulty with speech or problems with balance and coordination.  Psych: Pt has a history of depression. Denies anxiety, SI/HI.  No other specific complaints in a complete review of systems  (except as listed in HPI above).  Objective:   Physical Exam  BP 121/83 (BP Location: Right Arm, Patient Position: Sitting, Cuff Size: Normal)   Pulse 88   Temp 97.7 F (36.5 C) (Temporal)   Resp 18   Ht 5\' 11"  (1.803 m)   Wt 223 lb 12.8 oz (101.5 kg)   SpO2 99%   BMI 31.21 kg/m   Wt Readings from Last 3 Encounters:  02/11/20 229 lb (103.9 kg)  12/13/19 237 lb (107.5 kg)  07/15/19 220 lb (99.8 kg)    General: Appears his stated age, obese, in NAD. Skin: Warm, dry and intact.  HEENT: Head: normal shape and size; Eyes: sclera white and EOMs intact;  Neck:  Neck supple, trachea midline. No masses, lumps or thyromegaly present.  Cardiovascular: Normal rate and rhythm. S1,S2 noted.  No murmur, rubs or gallops noted. No JVD or BLE edema.  Pulmonary/Chest: Normal effort and positive vesicular breath sounds. No respiratory distress. No wheezes, rales or ronchi noted.  Abdomen: Soft and nontender. Normal bowel sounds. No distention or masses noted. Liver, spleen and kidneys non palpable. Musculoskeletal: Strength 5/5 BUE/BLE. No difficulty with gait.  Neurological: Alert and oriented. Cranial nerves II-XII grossly intact. Coordination normal.  Psychiatric: Mood and affect normal. Behavior is normal. Judgment and thought content normal.     BMET    Component Value Date/Time   NA 142 05/26/2020 0814   K 4.4 05/26/2020 0814   CL 103 05/26/2020 0814   CO2 25 05/26/2020 0814   GLUCOSE 107 (H) 05/26/2020 0814   GLUCOSE 70 02/11/2020 1505   BUN 24 05/26/2020 0814   CREATININE 1.05 05/26/2020 0814   CREATININE 1.08 02/11/2020 1505   CALCIUM 9.8 05/26/2020 0814   GFRNONAA 77 07/09/2018 0836   GFRAA 89 07/09/2018 0836    Lipid Panel     Component Value Date/Time   CHOL 169 05/26/2020 0814   TRIG 92 05/26/2020 0814   HDL 37 (L) 05/26/2020 0814   CHOLHDL 4.6 05/26/2020 0814   CHOLHDL 7.7 (H) 02/11/2020 1505   LDLCALC 115 (H) 05/26/2020 0814   LDLCALC 196 (H) 02/11/2020 1505     CBC    Component Value Date/Time   WBC 5.4 02/11/2020 1505   RBC 4.67 02/11/2020 1505   HGB 14.7 02/11/2020 1505   HGB 14.0 07/09/2018 0836   HCT 41.5 02/11/2020 1505   HCT 41.0 07/09/2018 0836   PLT 203 02/11/2020 1505   PLT 174 07/09/2018 0836   MCV 88.9 02/11/2020 1505   MCV 91 07/09/2018 0836   MCH 31.5 02/11/2020 1505   MCHC 35.4 02/11/2020 1505   RDW 13.7 02/11/2020 1505   RDW 13.2 07/09/2018 0836   LYMPHSABS 0.8 07/09/2018 0836   MONOABS 0.8 04/18/2015 1930   EOSABS 0.1 07/09/2018 0836   BASOSABS 0.0 07/09/2018 0836    Hgb A1C Lab Results  Component Value Date   HGBA1C 5.4 02/11/2020  Assessment & Plan:   Preventative Health Maintenance:  He declines flu shot Tetanus UTD Encouraged him to get his covid booster Referral to GI for screening colonoscopy Encouraged him to consume a balanced diet and exercise regimen Advised him to see an eye doctor and dentist annually Will check CBC, CMET, Lipid, A1C today  RTC in 1 year, sooner if needed [name], NP This visit occurred during the SARS-CoV-2 public health emergency.  Safety protocols were in place, including screening questions prior to the visit, additional usage of staff PPE, and extensive cleaning of exam room while observing appropriate contact time as indicated for disinfecting solutions.

## 2021-02-02 NOTE — Assessment & Plan Note (Signed)
No longer taking Ambien Will monitor

## 2021-02-02 NOTE — Patient Instructions (Signed)

## 2021-02-02 NOTE — Assessment & Plan Note (Signed)
Continue Hydroxyzine and Armodafinil as prescribed He will continue to follow with psychiatry

## 2021-02-02 NOTE — Assessment & Plan Note (Signed)
CMET and lipid profile today Continue Rosuvastatin Encouraged him to consume a low fat diet

## 2021-02-02 NOTE — Assessment & Plan Note (Signed)
Not medicated Will monitor 

## 2021-02-02 NOTE — Assessment & Plan Note (Signed)
Encouraged diet and exercise for weight loss ?

## 2021-02-05 ENCOUNTER — Other Ambulatory Visit: Payer: Self-pay | Admitting: Psychiatry

## 2021-02-05 DIAGNOSIS — F3178 Bipolar disorder, in full remission, most recent episode mixed: Secondary | ICD-10-CM

## 2021-02-07 ENCOUNTER — Other Ambulatory Visit: Payer: Self-pay

## 2021-02-07 DIAGNOSIS — Z1211 Encounter for screening for malignant neoplasm of colon: Secondary | ICD-10-CM

## 2021-02-07 MED ORDER — CLENPIQ 10-3.5-12 MG-GM -GM/160ML PO SOLN
1.0000 | Freq: Once | ORAL | 0 refills | Status: AC
Start: 2021-02-07 — End: 2021-02-07

## 2021-02-07 NOTE — Progress Notes (Signed)
Gastroenterology Pre-Procedure Review  Request Date: 03/08/2021 Requesting Physician: Dr. Vicente Males  PATIENT REVIEW QUESTIONS: The patient responded to the following health history questions as indicated:    1. Are you having any GI issues? no 2. Do you have a personal history of Polyps? no 3. Do you have a family history of Colon Cancer or Polyps? no 4. Diabetes Mellitus? no 5. Joint replacements in the past 12 months?no 6. Major health problems in the past 3 months?no 7. Any artificial heart valves, MVP, or defibrillator?no    MEDICATIONS & ALLERGIES:    Patient reports the following regarding taking any anticoagulation/antiplatelet therapy:   Plavix, Coumadin, Eliquis, Xarelto, Lovenox, Pradaxa, Brilinta, or Effient? no Aspirin? no  Patient confirms/reports the following medications:  Current Outpatient Medications  Medication Sig Dispense Refill   Armodafinil 150 MG tablet TAKE 0.5 TABLETS (75 MG TOTAL) BY MOUTH DAILY. 15 tablet 1   hydrOXYzine (VISTARIL) 25 MG capsule TAKE 1-2 CAPSULES BY MOUTH AT BEDTIME AS NEEDED. FOR SEVERE ANXIETY , RACING THOUGHTS, SLEEP 180 capsule 1   rosuvastatin (CRESTOR) 5 MG tablet Take 5 mg by mouth every other day.     No current facility-administered medications for this visit.    Patient confirms/reports the following allergies:  Allergies  Allergen Reactions   Flomax [Tamsulosin Hcl] Rash    No orders of the defined types were placed in this encounter.   AUTHORIZATION INFORMATION Primary Insurance: 1D#: Group #:  Secondary Insurance: 1D#: Group #:  SCHEDULE INFORMATION: Date: 03/08/2021 Time: Location: [facility]

## 2021-02-09 ENCOUNTER — Encounter: Payer: Self-pay | Admitting: Psychiatry

## 2021-02-09 ENCOUNTER — Telehealth (INDEPENDENT_AMBULATORY_CARE_PROVIDER_SITE_OTHER): Payer: 59 | Admitting: Psychiatry

## 2021-02-09 ENCOUNTER — Other Ambulatory Visit: Payer: Self-pay

## 2021-02-09 DIAGNOSIS — G4726 Circadian rhythm sleep disorder, shift work type: Secondary | ICD-10-CM | POA: Diagnosis not present

## 2021-02-09 DIAGNOSIS — F3178 Bipolar disorder, in full remission, most recent episode mixed: Secondary | ICD-10-CM

## 2021-02-09 NOTE — Progress Notes (Signed)
Virtual Visit via Video Note  I connected with Dale Davis on 02/09/21 at 11:20 AM EST by a video enabled telemedicine application and verified that I am speaking with the correct person using two identifiers.  Location Provider Location : [facility] Patient Location : Home  Participants: Patient , Provider   I discussed the limitations of evaluation and management by telemedicine and the availability of in person appointments. The patient expressed understanding and agreed to proceed.    I discussed the assessment and treatment plan with the patient. The patient was provided an opportunity to ask questions and all were answered. The patient agreed with the plan and demonstrated an understanding of the instructions.   The patient was advised to call back or seek an in-person evaluation if the symptoms worsen or if the condition fails to improve as anticipated.  Video connection was lost at less than 50% of the duration of the visit, at which time the remainder of the visit was completed through audio only    [facility] MD OP Progress Note  02/09/2021 12:30 PM DAISY MCNEEL  MRN:  [id number]  Chief Complaint:  Chief Complaint   Follow-up; Anxiety; Depression    HPI: Dale Davis is a 45 year old Caucasian male, married, employed, lives in [place], has a history of bipolar disorder, shiftwork disorder, insomnia was evaluated by telemedicine today.  Patient today reports his mood symptoms continues to be stable.  Denies any mood lability.  Denies any anxiety.  Denies any suicidality, homicidality or perceptual disturbances.  Patient is currently completely off of the Symbyax.  He reports he has not noticed any worsening mood symptoms or perceptual disturbances or anything since being off of it.  He does have olanzapine as needed available however he has not been using it.  Patient does have sleep problems on and off.  He reports there has been nights when he has difficulty falling  asleep.  He is currently trying to work on sleep hygiene techniques.  Patient also has hydroxyzine available which he uses as needed and it helps.  He uses the armodafinil in the morning and has been trying to taper him off of it.  Currently takes 1/4 pill of the 150 mg.  He would like to get himself off of it if possible.  Patient reports work is going well.  Patient reports he is not interested in any medication changes today and would like to stay on the same medication regimen.  Patient denies any other concerns today.  Visit Diagnosis:    ICD-10-CM   1. Bipolar disorder, in full remission, most recent episode mixed ([place])  F31.78     2. 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 toe    Family Psychiatric History: Reviewed family psychiatric history from progress note on 06/26/2018  Family History:  Family History  Problem Relation Age of Onset   Kidney failure Father     Social History: Reviewed social history from progress note on 06/26/2018 Social History   Socioeconomic History  Marital status: Married    Spouse name: [name]   Number of children: 0   Years of education: Not on file   Highest education level: Not on file  Occupational History   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  Tobacco Use   Smoking status: Never   Smokeless tobacco: Former    Types: Chew    Quit date: 06/2020  Vaping Use   Vaping Use: Never used  Substance and Sexual Activity   Alcohol use: Yes     Alcohol/week: 1.0 standard drink    Types: 1 Standard drinks or equivalent per week    Comment: occassional   Drug use: No   Sexual activity: Yes  Other Topics Concern   Not on file  Social History Narrative   Not on file   Social Determinants of Health   Financial Resource Strain: Not on file  Food Insecurity: Not on file  Transportation Needs: Not on file  Physical Activity: Not on file  Stress: Not on file  Social Connections: Not on file    Allergies:  Allergies  Allergen Reactions   Flomax [Tamsulosin Hcl] Rash    Metabolic Disorder Labs: Lab Results  Component Value Date   HGBA1C 5.4 02/11/2020   MPG 108 02/11/2020   Lab Results  Component Value Date   PROLACTIN 10.7 06/06/2020   PROLACTIN 6.5 07/15/2019   Lab Results  Component Value Date   CHOL 169 05/26/2020   TRIG 92 05/26/2020   HDL 37 (L) 05/26/2020   CHOLHDL 4.6 05/26/2020   LDLCALC 115 (H) 05/26/2020   LDLCALC 196 (H) 02/11/2020   Lab Results  Component Value Date   TSH 1.49 06/24/2019   TSH 1.830 07/09/2018    Therapeutic Level Labs: No results found for: LITHIUM No results found for: VALPROATE No components found for:  CBMZ  Current Medications: Current Outpatient Medications  Medication Sig Dispense Refill   Armodafinil 150 MG tablet TAKE 0.5 TABLETS (75 MG TOTAL) BY MOUTH DAILY. 15 tablet 1   CLENPIQ 10-3.5-12 MG-GM -GM/160ML SOLN Take by mouth. (Patient not taking: Reported on 02/09/2021)     hydrOXYzine (VISTARIL) 25 MG capsule TAKE 1-2 CAPSULES BY MOUTH AT BEDTIME AS NEEDED. FOR SEVERE ANXIETY , RACING THOUGHTS, SLEEP 180 capsule 1   rosuvastatin (CRESTOR) 5 MG tablet Take 5 mg by mouth every other day.     No current facility-administered medications for this visit.     Musculoskeletal: Strength & Muscle Tone:  UTA Gait & Station:  Seated Patient leans: N/A  Psychiatric Specialty Exam: Review of Systems  Psychiatric/Behavioral:  Positive for sleep disturbance.   All  other systems reviewed and are negative.  There were no vitals taken for this visit.There is no height or weight on file to calculate BMI.  General Appearance: Casual  Eye Contact:  Fair  Speech:  Clear and Coherent  Volume:  Normal  Mood:  Euthymic  Affect:  Congruent  Thought Process:  Goal Directed and Descriptions of Associations: Intact  Orientation:  Full (Time, Place, and Person)  Thought Content: Logical   Suicidal Thoughts:  No  Homicidal Thoughts:  No  Memory:  Immediate;   Fair Recent;   Fair Remote;   Fair  Judgement:  Fair  Insight:  Fair  Psychomotor Activity:  Normal  Concentration:  Concentration: Fair and Attention Span: Fair  Recall:  [employer] of Knowledge: Fair  Language: Fair  Akathisia:  No  Handed:  Right  AIMS (if indicated): done, 0  Assets:  Communication Skills Desire for [place] Talents/Skills Transportation  ADL's:  Intact  Cognition: WNL  Sleep:  Poor   Screenings: [place] Office Visit from 08/16/2020 in [place] Total Score 0      GAD-7    Flowsheet Row Video Visit from 12/13/2020 in [place] Office Visit from 08/16/2020 in [place]  Total GAD-7 Score 0 0      PHQ2-9    Flowsheet Row Video Visit from 12/13/2020 in [place] Office Visit from 08/16/2020 in [place]  PHQ-2 Total Score 0 0  PHQ-9 Total Score 0 0      Flowsheet Row Video Visit from 12/13/2020 in [place] Office Visit from 08/16/2020 in [place] No Risk No Risk        Assessment and Plan: Dale Davis is a 44 year old Caucasian male, married, employed, lives in [place], has a history of bipolar disorder in remission, shiftwork disorder, primary insomnia, hypersomnia  was evaluated by telemedicine today.  Patient is currently having sleep problems although it is manageable.  Discussed plan as noted below.  Plan Bipolar disorder in remission Continue hydroxyzine 25 to 50 mg p.o. nightly as needed for racing thoughts and sleep Patient is currently off of the olanzapine. However does have olanzapine 2.5 mg p.o. nightly as needed available for mood and sleep.  Shiftwork disorder-stable Continue sleep hygiene techniques Continue armodafinil 150 mg p.o. daily-patient is using quarter tablet.   Follow-up in clinic in 4 months or sooner if needed.   I have spent at least 15 minutes non face to face with patient today.  This note was generated in part or whole with voice recognition software. Voice recognition is usually quite accurate but there are transcription errors that can and very often do occur. I apologize for any typographical errors that were not detected and corrected.   Ursula Alert, MD 02/09/2021, 12:30 PM

## 2021-02-13 ENCOUNTER — Encounter: Payer: Managed Care, Other (non HMO) | Admitting: Internal Medicine

## 2021-02-20 ENCOUNTER — Other Ambulatory Visit: Payer: Self-pay

## 2021-03-28 ENCOUNTER — Other Ambulatory Visit: Payer: Self-pay | Admitting: Psychiatry

## 2021-03-28 DIAGNOSIS — G4726 Circadian rhythm sleep disorder, shift work type: Secondary | ICD-10-CM

## 2021-04-23 ENCOUNTER — Other Ambulatory Visit: Payer: Self-pay

## 2021-04-23 ENCOUNTER — Encounter (HOSPITAL_COMMUNITY): Payer: Self-pay | Admitting: Emergency Medicine

## 2021-04-23 ENCOUNTER — Emergency Department (HOSPITAL_COMMUNITY): Payer: Managed Care, Other (non HMO)

## 2021-04-23 ENCOUNTER — Inpatient Hospital Stay (HOSPITAL_COMMUNITY): Payer: Managed Care, Other (non HMO)

## 2021-04-23 ENCOUNTER — Inpatient Hospital Stay (HOSPITAL_COMMUNITY)
Admission: EM | Admit: 2021-04-23 | Discharge: 2021-04-27 | DRG: 418 | Disposition: A | Discharge status: Home / Self Care | Payer: Managed Care, Other (non HMO) | Attending: Family Medicine | Admitting: Family Medicine

## 2021-04-23 DIAGNOSIS — K3189 Other diseases of stomach and duodenum: Secondary | ICD-10-CM | POA: Diagnosis not present

## 2021-04-23 DIAGNOSIS — E782 Mixed hyperlipidemia: Secondary | ICD-10-CM

## 2021-04-23 DIAGNOSIS — Q396 Congenital diverticulum of esophagus: Secondary | ICD-10-CM | POA: Diagnosis not present

## 2021-04-23 DIAGNOSIS — F32A Depression, unspecified: Secondary | ICD-10-CM | POA: Diagnosis present

## 2021-04-23 DIAGNOSIS — K858 Other acute pancreatitis without necrosis or infection: Secondary | ICD-10-CM

## 2021-04-23 DIAGNOSIS — R7989 Other specified abnormal findings of blood chemistry: Secondary | ICD-10-CM | POA: Diagnosis present

## 2021-04-23 DIAGNOSIS — Z888 Allergy status to other drugs, medicaments and biological substances status: Secondary | ICD-10-CM

## 2021-04-23 DIAGNOSIS — Z79899 Other long term (current) drug therapy: Secondary | ICD-10-CM | POA: Diagnosis not present

## 2021-04-23 DIAGNOSIS — K8011 Calculus of gallbladder with chronic cholecystitis with obstruction: Secondary | ICD-10-CM | POA: Diagnosis present

## 2021-04-23 DIAGNOSIS — F17220 Nicotine dependence, chewing tobacco, uncomplicated: Secondary | ICD-10-CM | POA: Diagnosis present

## 2021-04-23 DIAGNOSIS — K831 Obstruction of bile duct: Secondary | ICD-10-CM | POA: Diagnosis present

## 2021-04-23 DIAGNOSIS — Z87442 Personal history of urinary calculi: Secondary | ICD-10-CM | POA: Diagnosis not present

## 2021-04-23 DIAGNOSIS — R748 Abnormal levels of other serum enzymes: Secondary | ICD-10-CM

## 2021-04-23 DIAGNOSIS — R109 Unspecified abdominal pain: Secondary | ICD-10-CM | POA: Diagnosis present

## 2021-04-23 DIAGNOSIS — E785 Hyperlipidemia, unspecified: Secondary | ICD-10-CM | POA: Diagnosis present

## 2021-04-23 DIAGNOSIS — K2270 Barrett's esophagus without dysplasia: Secondary | ICD-10-CM | POA: Diagnosis present

## 2021-04-23 DIAGNOSIS — R1084 Generalized abdominal pain: Secondary | ICD-10-CM

## 2021-04-23 DIAGNOSIS — K851 Biliary acute pancreatitis without necrosis or infection: Secondary | ICD-10-CM | POA: Diagnosis present

## 2021-04-23 DIAGNOSIS — R1319 Other dysphagia: Secondary | ICD-10-CM

## 2021-04-23 DIAGNOSIS — Z20822 Contact with and (suspected) exposure to covid-19: Secondary | ICD-10-CM | POA: Diagnosis present

## 2021-04-23 DIAGNOSIS — R1310 Dysphagia, unspecified: Secondary | ICD-10-CM

## 2021-04-23 DIAGNOSIS — K859 Acute pancreatitis without necrosis or infection, unspecified: Secondary | ICD-10-CM | POA: Diagnosis not present

## 2021-04-23 DIAGNOSIS — R12 Heartburn: Secondary | ICD-10-CM | POA: Diagnosis present

## 2021-04-23 DIAGNOSIS — Z841 Family history of disorders of kidney and ureter: Secondary | ICD-10-CM

## 2021-04-23 DIAGNOSIS — K319 Disease of stomach and duodenum, unspecified: Secondary | ICD-10-CM | POA: Diagnosis present

## 2021-04-23 DIAGNOSIS — R1013 Epigastric pain: Secondary | ICD-10-CM | POA: Diagnosis not present

## 2021-04-23 DIAGNOSIS — K2100 Gastro-esophageal reflux disease with esophagitis, without bleeding: Secondary | ICD-10-CM | POA: Diagnosis present

## 2021-04-23 DIAGNOSIS — K8001 Calculus of gallbladder with acute cholecystitis with obstruction: Secondary | ICD-10-CM | POA: Diagnosis not present

## 2021-04-23 DIAGNOSIS — R7401 Elevation of levels of liver transaminase levels: Secondary | ICD-10-CM

## 2021-04-23 DIAGNOSIS — R1012 Left upper quadrant pain: Secondary | ICD-10-CM | POA: Diagnosis not present

## 2021-04-23 DIAGNOSIS — F419 Anxiety disorder, unspecified: Secondary | ICD-10-CM | POA: Diagnosis present

## 2021-04-23 DIAGNOSIS — K2289 Other specified disease of esophagus: Secondary | ICD-10-CM | POA: Diagnosis not present

## 2021-04-23 DIAGNOSIS — J302 Other seasonal allergic rhinitis: Secondary | ICD-10-CM | POA: Diagnosis present

## 2021-04-23 DIAGNOSIS — K8020 Calculus of gallbladder without cholecystitis without obstruction: Secondary | ICD-10-CM

## 2021-04-23 LAB — PHOSPHORUS: Phosphorus: 2.8 mg/dL (ref 2.5–4.6)

## 2021-04-23 LAB — GLUCOSE, CAPILLARY
Glucose-Capillary: 81 mg/dL (ref 70–99)
Glucose-Capillary: 66 mg/dL — ABNORMAL LOW (ref 70–99)
Glucose-Capillary: 107 mg/dL — ABNORMAL HIGH (ref 70–99)

## 2021-04-23 LAB — CBC
HCT: 41 % (ref 39.0–52.0)
Hemoglobin: 14.1 g/dL (ref 13.0–17.0)
MCH: 30.3 pg (ref 26.0–34.0)
MCHC: 34.4 g/dL (ref 30.0–36.0)
MCV: 88.2 fL (ref 80.0–100.0)
Platelets: 175 10*3/uL (ref 150–400)
RBC: 4.65 MIL/uL (ref 4.22–5.81)
RDW: 12.4 % (ref 11.5–15.5)
WBC: 6.2 10*3/uL (ref 4.0–10.5)
nRBC: 0 % (ref 0.0–0.2)

## 2021-04-23 LAB — URINALYSIS, ROUTINE W REFLEX MICROSCOPIC
Bacteria, UA: NONE SEEN
Bilirubin Urine: NEGATIVE
Glucose, UA: NEGATIVE mg/dL
Hgb urine dipstick: NEGATIVE
Ketones, ur: NEGATIVE mg/dL
Leukocytes,Ua: NEGATIVE
Nitrite: NEGATIVE
Protein, ur: 30 mg/dL — AB
Specific Gravity, Urine: 1.021 (ref 1.005–1.030)
pH: 7 (ref 5.0–8.0)

## 2021-04-23 LAB — COMPREHENSIVE METABOLIC PANEL
ALT: 72 U/L — ABNORMAL HIGH (ref 0–44)
AST: 95 U/L — ABNORMAL HIGH (ref 15–41)
Albumin: 4.1 g/dL (ref 3.5–5.0)
Alkaline Phosphatase: 63 U/L (ref 38–126)
Anion gap: 7 (ref 5–15)
BUN: 24 mg/dL — ABNORMAL HIGH (ref 6–20)
CO2: 30 mmol/L (ref 22–32)
Calcium: 9 mg/dL (ref 8.9–10.3)
Chloride: 102 mmol/L (ref 98–111)
Creatinine, Ser: 1.02 mg/dL (ref 0.61–1.24)
GFR, Estimated: >60 mL/min (ref >60)
Glucose, Bld: 109 mg/dL — ABNORMAL HIGH (ref 70–99)
Potassium: 3.7 mmol/L (ref 3.5–5.1)
Sodium: 139 mmol/L (ref 135–145)
Total Bilirubin: 1 mg/dL (ref 0.3–1.2)
Total Protein: 7.4 g/dL (ref 6.5–8.1)

## 2021-04-23 LAB — LIPID PANEL
Cholesterol: 261 mg/dL — ABNORMAL HIGH (ref 0–200)
HDL: 40 mg/dL — ABNORMAL LOW (ref >40)
LDL Cholesterol: 193 mg/dL — ABNORMAL HIGH (ref 0–99)
Total CHOL/HDL Ratio: 6.5 RATIO
Triglycerides: 141 mg/dL (ref <150)
VLDL: 28 mg/dL (ref 0–40)

## 2021-04-23 LAB — RESP PANEL BY RT-PCR (FLU A&B, COVID) ARPGX2
Influenza A by PCR: NEGATIVE
Influenza B by PCR: NEGATIVE
SARS Coronavirus 2 by RT PCR: NEGATIVE

## 2021-04-23 LAB — TROPONIN I (HIGH SENSITIVITY)
Troponin I (High Sensitivity): 3 ng/L (ref <18)
Troponin I (High Sensitivity): 3 ng/L (ref <18)

## 2021-04-23 LAB — APTT: aPTT: 25 seconds (ref 24–36)

## 2021-04-23 LAB — HIV ANTIBODY (ROUTINE TESTING W REFLEX): HIV Screen 4th Generation wRfx: NONREACTIVE

## 2021-04-23 LAB — LIPASE, BLOOD: Lipase: 384 U/L — ABNORMAL HIGH (ref 11–51)

## 2021-04-23 LAB — MAGNESIUM: Magnesium: 2.1 mg/dL (ref 1.7–2.4)

## 2021-04-23 MED ORDER — ENOXAPARIN SODIUM 40 MG/0.4ML IJ SOSY
40.0000 mg | PREFILLED_SYRINGE | INTRAMUSCULAR | Status: DC
  Administered 2021-04-23: 40 mg via SUBCUTANEOUS
  Filled 2021-04-23: qty 0.4

## 2021-04-23 MED ORDER — MORPHINE SULFATE (PF) 4 MG/ML IV SOLN
INTRAVENOUS | Status: AC
  Administered 2021-04-23: 3 mg via INTRAVENOUS
  Filled 2021-04-23: qty 1

## 2021-04-23 MED ORDER — HYDROMORPHONE HCL 1 MG/ML IJ SOLN
1.0000 mg | Freq: Once | INTRAMUSCULAR | Status: AC
  Administered 2021-04-23: 1 mg via INTRAVENOUS
  Filled 2021-04-23: qty 1

## 2021-04-23 MED ORDER — TECHNETIUM TC 99M MEBROFENIN IV KIT
5.0000 | PACK | Freq: Once | INTRAVENOUS | Status: AC | PRN
  Administered 2021-04-23: 5.5 via INTRAVENOUS

## 2021-04-23 MED ORDER — KETOROLAC TROMETHAMINE 15 MG/ML IJ SOLN
15.0000 mg | Freq: Four times a day (QID) | INTRAMUSCULAR | Status: DC | PRN
  Administered 2021-04-23 (×2): 15 mg via INTRAVENOUS
  Filled 2021-04-23 (×2): qty 1

## 2021-04-23 MED ORDER — DEXTROSE 50 % IV SOLN
INTRAVENOUS | Status: AC
  Administered 2021-04-23: 50 mL
  Filled 2021-04-23: qty 50

## 2021-04-23 MED ORDER — MORPHINE SULFATE (PF) 4 MG/ML IV SOLN: 3.0000 mg | Freq: Once | INTRAVENOUS | Status: AC

## 2021-04-23 MED ORDER — IOHEXOL 300 MG/ML  SOLN
100.0000 mL | Freq: Once | INTRAMUSCULAR | Status: AC | PRN
  Administered 2021-04-23: 100 mL via INTRAVENOUS

## 2021-04-23 MED ORDER — ALUM & MAG HYDROXIDE-SIMETH 200-200-20 MG/5ML PO SUSP
30.0000 mL | ORAL | Status: DC | PRN
  Administered 2021-04-23: 30 mL via ORAL
  Filled 2021-04-23: qty 30

## 2021-04-23 MED ORDER — KETOROLAC TROMETHAMINE 30 MG/ML IJ SOLN
30.0000 mg | Freq: Four times a day (QID) | INTRAMUSCULAR | Status: DC | PRN
  Administered 2021-04-24: 15 mg via INTRAVENOUS
  Administered 2021-04-24: 30 mg via INTRAVENOUS
  Filled 2021-04-23 (×2): qty 1

## 2021-04-23 MED ORDER — PANTOPRAZOLE SODIUM 40 MG IV SOLR
40.0000 mg | INTRAVENOUS | Status: DC
  Administered 2021-04-23: 40 mg via INTRAVENOUS
  Filled 2021-04-23: qty 10

## 2021-04-23 MED ORDER — HYDROMORPHONE HCL 1 MG/ML IJ SOLN
0.5000 mg | INTRAMUSCULAR | Status: DC | PRN
  Administered 2021-04-23 (×2): 0.5 mg via INTRAVENOUS
  Filled 2021-04-23 (×2): qty 0.5

## 2021-04-23 MED ORDER — ONDANSETRON HCL 4 MG/2ML IJ SOLN
4.0000 mg | Freq: Four times a day (QID) | INTRAMUSCULAR | Status: DC | PRN
Start: 2021-04-23 — End: 2021-04-27
  Administered 2021-04-24 (×2): 4 mg via INTRAVENOUS
  Filled 2021-04-23 (×2): qty 2

## 2021-04-23 MED ORDER — SODIUM CHLORIDE 0.9 % IV BOLUS
1000.0000 mL | Freq: Once | INTRAVENOUS | Status: AC
Start: 2021-04-23 — End: 2021-04-23
  Administered 2021-04-23: 1000 mL via INTRAVENOUS

## 2021-04-23 MED ORDER — PANTOPRAZOLE SODIUM 40 MG IV SOLR: 40.0000 mg | INTRAVENOUS | Status: DC

## 2021-04-23 MED ORDER — LIDOCAINE VISCOUS HCL 2 % MT SOLN
15.0000 mL | Freq: Once | OROMUCOSAL | Status: AC
  Administered 2021-04-23: 15 mL via ORAL
  Filled 2021-04-23: qty 15

## 2021-04-23 MED ORDER — NICOTINE 21 MG/24HR TD PT24
21.0000 mg | MEDICATED_PATCH | Freq: Every day | TRANSDERMAL | Status: DC
  Administered 2021-04-23 – 2021-04-25 (×2): 21 mg via TRANSDERMAL
  Filled 2021-04-23 (×5): qty 1

## 2021-04-23 MED ORDER — PANTOPRAZOLE SODIUM 40 MG IV SOLR
40.0000 mg | Freq: Two times a day (BID) | INTRAVENOUS | Status: DC
  Administered 2021-04-23 – 2021-04-27 (×7): 40 mg via INTRAVENOUS
  Filled 2021-04-23 (×8): qty 10

## 2021-04-23 MED ORDER — FAMOTIDINE 20 MG PO TABS
40.0000 mg | ORAL_TABLET | Freq: Once | ORAL | Status: AC
Start: 2021-04-23 — End: 2021-04-23
  Administered 2021-04-23: 40 mg via ORAL
  Filled 2021-04-23: qty 2

## 2021-04-23 MED ORDER — ALUM & MAG HYDROXIDE-SIMETH 200-200-20 MG/5ML PO SUSP
30.0000 mL | Freq: Once | ORAL | Status: AC
  Administered 2021-04-23: 30 mL via ORAL
  Filled 2021-04-23: qty 30

## 2021-04-23 MED ORDER — SODIUM CHLORIDE 0.9 % IV SOLN: INTRAVENOUS | Status: DC

## 2021-04-23 MED ORDER — LACTATED RINGERS IV SOLN: INTRAVENOUS | Status: AC

## 2021-04-23 MED ORDER — ONDANSETRON 8 MG PO TBDP
8.0000 mg | ORAL_TABLET | Freq: Once | ORAL | Status: AC
  Administered 2021-04-23: 8 mg via ORAL
  Filled 2021-04-23: qty 1

## 2021-04-23 NOTE — Assessment & Plan Note (Addendum)
Regular diet started 3/2 after lap chole

## 2021-04-23 NOTE — TOC Progression Note (Signed)
°  Transition of Care [facility]) Screening Note   Patient Details  Name: ALSON MCPHEETERS Date of Birth: March 09, 1975   Transition [facility]) CM/SW Contact:    Shade Flood, LCSW Phone Number: 04/23/2021, 10:58 AM    Transition of Care Department [facility]) has reviewed patient and no TOC needs have been identified at this time. We will continue to monitor patient advancement through interdisciplinary progression rounds. If new patient transition needs arise, please place a TOC consult.

## 2021-04-23 NOTE — Assessment & Plan Note (Addendum)
Secondary to gallstone pancreatitis, resolved now POD#1 s/p lap chole on 3/2.  Surgery team advanced to regular diet

## 2021-04-23 NOTE — ED Provider Notes (Signed)
[facility] Provider Note   CSN: [id number] Arrival date & time: 04/23/21  0045     History  Chief Complaint  Patient presents with   Abdominal Pain    Dale Davis is a 46 y.o. male.  Patient presents to the emergency department for evaluation of abdominal pain.  Patient reports that pain started around bedtime tonight.  Patient with persistent central upper abdominal pain.  He has had nausea and vomiting associated with the pain.  Pain does not radiate.      Home Medications Prior to Admission medications   Medication Sig Start Date End Date Taking? Authorizing Provider  Armodafinil 150 MG tablet TAKE 1/2 TABLET (75 MG TOTAL) BY MOUTH DAILY. 03/28/21   Ursula Alert, MD  CLENPIQ 10-3.5-12 MG-GM -GM/160ML SOLN Take by mouth. Patient not taking: Reported on 02/09/2021 02/07/21   [provider]  hydrOXYzine (VISTARIL) 25 MG capsule TAKE 1-2 CAPSULES BY MOUTH AT BEDTIME AS NEEDED. FOR SEVERE ANXIETY , RACING THOUGHTS, SLEEP 02/06/21   Ursula Alert, MD  rosuvastatin (CRESTOR) 5 MG tablet Take 5 mg by mouth every other day.    [provider]      Allergies    Flomax [tamsulosin hcl]    Review of Systems   Review of Systems  Gastrointestinal:  Positive for abdominal pain, nausea and vomiting.   Physical Exam Updated Vital Signs BP 121/80    Pulse 62    Temp (!) 97.5 F (36.4 C) (Oral)    Resp 10    Ht 5\' 11"  (1.803 m)    Wt 97.5 kg    SpO2 93%    BMI 29.99 kg/m  Physical Exam Vitals and nursing note reviewed.  Constitutional:      General: He is not in acute distress.    Appearance: He is well-developed.  HENT:     Head: Normocephalic and atraumatic.     Mouth/Throat:     Mouth: Mucous membranes are moist.  Eyes:     General: Vision grossly intact. Gaze aligned appropriately.     Extraocular Movements: Extraocular movements intact.     Conjunctiva/sclera: Conjunctivae normal.  Cardiovascular:     Rate and Rhythm:  Normal rate and regular rhythm.     Pulses: Normal pulses.     Heart sounds: Normal heart sounds, S1 normal and S2 normal. No murmur heard.   No friction rub. No gallop.  Pulmonary:     Effort: Pulmonary effort is normal. No respiratory distress.     Breath sounds: Normal breath sounds.  Abdominal:     Palpations: Abdomen is soft.     Tenderness: There is abdominal tenderness in the epigastric area. There is no guarding or rebound.     Hernia: No hernia is present.  Musculoskeletal:        General: No swelling.     Cervical back: Full passive range of motion without pain, normal range of motion and neck supple. No pain with movement, spinous process tenderness or muscular tenderness. Normal range of motion.     Right lower leg: No edema.     Left lower leg: No edema.  Skin:    General: Skin is warm and dry.     Capillary Refill: Capillary refill takes less than 2 seconds.     Findings: No ecchymosis, erythema, lesion or wound.  Neurological:     Mental Status: He is alert and oriented to person, place, and time.     GCS: GCS eye  subscore is 4. GCS verbal subscore is 5. GCS motor subscore is 6.     Cranial Nerves: Cranial nerves 2-12 are intact.     Sensory: Sensation is intact.     Motor: Motor function is intact. No weakness or abnormal muscle tone.     Coordination: Coordination is intact.  Psychiatric:        Mood and Affect: Mood normal.        Speech: Speech normal.        Behavior: Behavior normal.    ED Results / Procedures / Treatments   Labs (all labs ordered are listed, but only abnormal results are displayed) Labs Reviewed  LIPASE, BLOOD - Abnormal; Notable for the following components:      Result Value   Lipase 384 (*)    All other components within normal limits  COMPREHENSIVE METABOLIC PANEL - Abnormal; Notable for the following components:   Glucose, Bld 109 (*)    BUN 24 (*)    AST 95 (*)    ALT 72 (*)    All other components within normal limits   URINALYSIS, ROUTINE W REFLEX MICROSCOPIC - Abnormal; Notable for the following components:   APPearance HAZY (*)    Protein, ur 30 (*)    All other components within normal limits  RESP PANEL BY RT-PCR (FLU A&B, COVID) ARPGX2  CBC  TROPONIN I (HIGH SENSITIVITY)  TROPONIN I (HIGH SENSITIVITY)    EKG EKG Interpretation  Date/Time:  Monday April 23 2021 00:58:48 EST Ventricular Rate:  66 PR Interval:  162 QRS Duration: 100 QT Interval:  407 QTC Calculation: 427 R Axis:   39 Text Interpretation: Sinus rhythm Abnormal R-wave progression, early transition Baseline wander in lead(s) II III aVF V1 Confirmed by Orpah Greek [phone]) on 04/23/2021 3:18:12 AM  Radiology CT ABDOMEN PELVIS W CONTRAST  Result Date: 04/23/2021 CLINICAL DATA:  Acute pancreatitis. EXAM: CT ABDOMEN AND PELVIS WITH CONTRAST TECHNIQUE: Multidetector CT imaging of the abdomen and pelvis was performed using the standard protocol following bolus administration of intravenous contrast. RADIATION DOSE REDUCTION: This exam was performed according to the departmental dose-optimization program which includes automated exposure control, adjustment of the mA and/or kV according to patient size and/or use of iterative reconstruction technique. CONTRAST:  179mL OMNIPAQUE IOHEXOL 300 MG/ML  SOLN COMPARISON:  CT abdomen pelvis dated 12/16/2019. FINDINGS: Lower chest: The visualized lung bases are clear. No intra-abdominal free air or free fluid. Hepatobiliary: Mild fatty liver. No intrahepatic biliary dilatation. The gallbladder is unremarkable. Pancreas: Unremarkable. No pancreatic ductal dilatation or surrounding inflammatory changes. Spleen: Normal in size without focal abnormality. Adrenals/Urinary Tract: The adrenal glands unremarkable. The kidneys, visualized ureters, and urinary bladder appear unremarkable. Stomach/Bowel: There is sigmoid diverticulosis and scattered colonic diverticula without active inflammatory  changes. There is no bowel obstruction or active inflammation. The appendix is normal. Vascular/Lymphatic: The abdominal aorta and IVC unremarkable. No portal venous gas. There is no adenopathy. Reproductive: The prostate and seminal vesicles are grossly unremarkable. No pelvic mass. Other: None Musculoskeletal: No acute or significant osseous findings. IMPRESSION: 1. No acute intra-abdominal or pelvic pathology. 2. Colonic diverticulosis. No bowel obstruction. Normal appendix. 3. Mild fatty liver. Electronically Signed   By: Anner Crete M.D.   On: 04/23/2021 02:56    Procedures Procedures    Medications Ordered in ED Medications  HYDROmorphone (DILAUDID) injection 1 mg (has no administration in time range)  sodium chloride 0.9 % bolus 1,000 mL (has no administration in time range)  famotidine (PEPCID) tablet 40 mg (40 mg Oral Given 04/23/21 0119)  alum & mag hydroxide-simeth (MAALOX/MYLANTA) 200-200-20 MG/5ML suspension 30 mL (30 mLs Oral Given 04/23/21 0121)    And  lidocaine (XYLOCAINE) 2 % viscous mouth solution 15 mL (15 mLs Oral Given 04/23/21 0121)  ondansetron (ZOFRAN-ODT) disintegrating tablet 8 mg (8 mg Oral Given 04/23/21 0119)  HYDROmorphone (DILAUDID) injection 1 mg (1 mg Intravenous Given 04/23/21 0203)  iohexol (OMNIPAQUE) 300 MG/ML solution 100 mL (100 mLs Intravenous Contrast Given 04/23/21 0238)    ED Course/ Medical Decision Making/ A&P                           Medical Decision Making Amount and/or Complexity of Data Reviewed Labs: ordered. Decision-making details documented in ED Course. Radiology: ordered. Decision-making details documented in ED Course. ECG/medicine tests: ordered and independent interpretation performed. Decision-making details documented in ED Course.  Risk Parenteral controlled substances. Decision regarding hospitalization.   Patient presents to the emergency department for evaluation of abdominal pain that began earlier  tonight.  Differential diagnosis gastritis, peptic ulcer disease, gallbladder disease, pancreatitis, diverticulitis, small bowel obstruction  Patient has significant epigastric tenderness upon initial evaluation.  No signs of peritonitis.  Lab work reveals slight elevation of transaminases and elevation of lipase.  Patient denies any alcohol intake.  He is not on any chronic medications that would cause pancreatitis.  No significant history of hypertriglyceridemia.  A CT scan was performed to further evaluate.  No cholelithiasis or choledocholithiasis is seen.  Patient still having significant pain despite parenteral analgesia.  Will require hospitalization for further management.         Final Clinical Impression(s) / ED Diagnoses Final diagnoses:  Acute pancreatitis without infection or necrosis, unspecified pancreatitis type    Rx / DC Orders ED Discharge Orders     None         Orpah Greek, MD 04/23/21 [phone]

## 2021-04-23 NOTE — ED Notes (Signed)
Patient transported to CT 

## 2021-04-23 NOTE — Assessment & Plan Note (Addendum)
S/p lap chole 3/2.  Resolved now.

## 2021-04-23 NOTE — Assessment & Plan Note (Addendum)
Patient states that he stopped taking home Crestor about 4 to 5 months ago due to diarrhea when taking this.  Pt advised to follow up with PCP.

## 2021-04-23 NOTE — Assessment & Plan Note (Addendum)
LFTs have normalized post lap chole.

## 2021-04-23 NOTE — H&P (Signed)
History and Physical    Patient: Dale Davis EGB:[id number] DOB: November 09, 1975 DOA: 04/23/2021 DOS: the patient was seen and examined on 04/23/2021 PCP: Jearld Fenton, NP  Patient coming from: Home  Chief Complaint:  Chief Complaint  Patient presents with   Abdominal Pain    HPI: Dale Davis is a 46 y.o. male with medical history significant of hyperlipidemia who presents to the emergency department due to abdominal pain which started around bedtime.  Patient states that he had a [place] dinner yesterday in the evening, he complained of a nonradiating epigastric pain which was rated as 9/10 on pain scale that started when he went to bed, he thought it was due to indigestion, so he took Tums and Pepto-Bismol without any relief, abdominal pain was associated with nausea and nonbloody vomiting.  Patient states that he has been taking Aleve twice daily for 4 to 5 days due to seasonal allergy.  He also complained of several months of feeling that food or meds get stuck at the bottom of his esophagus which appears to eventually resolve with time.  He denies fever, chills, chest pain, shortness of breath, headache, diarrhea or constipation.  ED course: In the emergency department, patient was hemodynamically stable.  Work-up in the ED showed normal CBC and BMP except for elevated BUN at 24. Lipase 384, AST 95, ALT 72 CT abdomen and pelvis with contrast showed no acute intra-abdominal or pelvic pathology He was treated with pain medication, GI cocktail, Zofran and IV hydration was provided.  Hospitalist was asked to admit patient for further evaluation and management.  Review of Systems: As mentioned in the history of present illness. All other systems reviewed and are negative. 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 He quit smokeless tobacco use about 9 months ago.  His smokeless tobacco use included chew. He reports current alcohol use of about 1.0 standard drink per week. He reports that he does not use drugs.  Allergies  Allergen Reactions   Flomax [Tamsulosin Hcl] Rash    Family History  Problem Relation Age of Onset   Kidney failure Father     Prior to Admission medications   Medication Sig Start Date End Date Taking? Authorizing Provider  Armodafinil 150 MG tablet TAKE 1/2 TABLET (75 MG TOTAL) BY MOUTH DAILY. 03/28/21   Ursula Alert, MD  CLENPIQ 10-3.5-12 MG-GM -GM/160ML SOLN Take by mouth. Patient not taking: Reported on 02/09/2021 02/07/21   [provider]  hydrOXYzine (VISTARIL) 25 MG capsule TAKE 1-2 CAPSULES BY MOUTH AT BEDTIME AS NEEDED. FOR SEVERE ANXIETY , RACING THOUGHTS, SLEEP 02/06/21   Ursula Alert, MD  rosuvastatin (CRESTOR) 5 MG tablet Take 5 mg by mouth every other day.    [provider]    Physical Exam: Vitals:   04/23/21 0230 04/23/21 0300 04/23/21 0330 04/23/21 0458  BP: 121/80 123/85 123/74 128/75  Pulse: 62 80 64 95  Resp: 10  (!) 23 15  Temp:    98.7 F (37.1 C)  TempSrc:    Oral  SpO2: 93% 94% 100% 97%  Weight:      Height:       General: Patient was awake and alert and oriented x3. Not in  any acute distress.  HEENT: NCAT.  PERRLA. EOMI. Sclerae anicteric.  Moist mucosal membranes. Neck: Neck supple without lymphadenopathy. No carotid bruits. No masses palpated.  Cardiovascular: Regular rate with normal S1-S2 sounds. No murmurs, rubs or gallops auscultated. No JVD.  Respiratory: Clear breath sounds.  No accessory muscle use. Abdomen: Soft, tender to palpation in the epigastric area, nondistended. Active bowel sounds. No masses or hepatosplenomegaly  Skin: No rashes, lesions, or ulcerations.  Dry, warm to touch. Musculoskeletal:  2+ dorsalis pedis and radial pulses. Good ROM.  No contractures   Psychiatric: Intact judgment and insight.  Mood appropriate to current condition. Neurologic: No focal neurological deficits. Strength is 5/5 x 4.  CN II - XII grossly intact.   Data Reviewed: EKG personally reviewed showed normal sinus rhythm at a rate of 66 bpm  Assessment and Plan: * Abdominal pain- (present on admission) This is possibly secondary to presumed acute pancreatitis and/or gastritis/peptic ulcer/esophageal stricture Patient states that he has been taking Aleve (NSAIDs) twice daily since the last 4 to 5 days due to seasonal allergy.  Pain was epigastric and was nonradiating (atypical for pancreatitis) He also complained of several months of sensation that food or meds get stuck at the bottom of his esophagus which appears to eventually resolve with time. Continue IV NS at 100 mLs/Hr Continue IV Dilaudid 0.5 mg q.3h p.r.n. for moderate to severe pain Continue IV Zofran p.r.n. Continue full liquid diet with plan to advanced diet as tolerated Gastroenterology will be consulted to help rule out need for an EGD   Pancreatitis, acute This is possibly due to presumed/questionable pancreatitis Lipase 384, patient presents with a nonradiating epigastric pain and CT abdomen pelvis with contrast showed no acute intra abdominal or pelvic pathology. Cause of patient's abdomen suspected to be possible gastritis  Vs peptic ulcer vs esophageal stricture  BISAP Score = 0 points (<1 % risk of mortality) Lipid panel will be checked Continue IV Zofran p.r.n Continue IV Dilaudid p.r.n for pain Continue Protonix Continue IV LR at 161ml/Hr Continue full liquid diet with plan to advance diet as tolerated RUQ U/S in the morning to investigate biliary etiology (gallstone and bile duct dilatation    Elevated lipase Lipase 384, this may be due to a reactive process versus presumed pancreatitis Continue management as described for acute pancreatitis  Transaminitis AST 95, ALT 72, this is  possibly related to patient's abdominal pain Lipid panel will be checked Right upper quadrant ultrasound will be done in the morning  HLD (hyperlipidemia)- (present on admission) Patient states that he stopped taking home Crestor about 4 to 5 months ago due to diarrhea when taking this.    Advance Care Planning: CODE STATUS: Full code  Consults: Gastroenterology  Family Communication: Wife at bedside (all questions answered to satisfaction)  Severity of Illness: The appropriate patient status for this patient is INPATIENT. Inpatient status is judged to be reasonable and necessary in order to provide the required intensity of service to ensure the patient's safety. The patient's presenting symptoms, physical exam findings, and initial radiographic and laboratory data in the context of their chronic comorbidities is felt to place them at high risk for further clinical deterioration. Furthermore, it is not anticipated that the patient will be medically stable for discharge from the hospital within 2 midnights of admission.   * I certify that at the point of admission it is my clinical judgment that the patient will require inpatient hospital care spanning beyond 2 midnights from the  point of admission due to high intensity of service, high risk for further deterioration and high frequency of surveillance required.*  Author: Bernadette Hoit, DO 04/23/2021 5:06 AM  For on call review [email or web address].

## 2021-04-23 NOTE — ED Triage Notes (Signed)
Pt with upper abdominal pain that started when he went to bed tonight. Originally thought it was indigestion. Pt vomited x 2 at home. Describes pain as "feels like a hole in the top of my stomach".

## 2021-04-23 NOTE — Consult Note (Signed)
Gastroenterology Consult   Referring Provider: No ref. provider found Primary Care Physician:  Jearld Fenton, NP Primary Gastroenterologist:  Dr. Vicente Males [facility])  Patient ID: Dale Davis; [id number]; 1975-04-12   Admit date: 04/23/2021  LOS: 0 days   Date of Consultation: 04/23/2021  Reason for Consultation:  possible esophageal stricture, gastritis vs PUD  History of Present Illness   Dale Davis is a 46 y.o. year old male with a history of Anxiety, Depression, and kidney stones. He presented overnight with c/o abdominal pain that started as he went to bed with associated N/V.He felt that it was indigestion however pain was unrelieved by Tums and Pepto-Bismol. Gi was consulted for possible esophageal stricture and gastritis vs PUD, and abdominal pain.   ED Course:  Lab work revealing Lipase 384, AST 95, ALT 72, BUN 24, LDL 193. Normal CBC. CT A/P revealing mild fatty liver, colonic diverticulosis, no cholelithiasis or cholecystitis, and no evidence of pancreatitis. Treated with GI cocktail, Zofran, and IVF.   Patient Interview: Had some sharp pain after [place] took some tums, never went to bed but as he was considering going to bed and lay down his pain got worse. He describes his pain as pain that is like how you may feel if you eat something very sugary or drink a very sugary drink and it happens about 10-15 minutes after this. Felt like he had a hole in his stomach and food was coming out of it. No nausea initially. He reports vomiting twice, once just liquid emesis (broken down liquid food), then reports the second time was pasty. Felt like a kindey stone is his abdomnen, pain just below his sternum. Epigastric pain, hurts sometimes LUQ and recently radiating to his back after the epigastric pain dulls off.  Has mild acid reflux, usually has no issue with [place] food as he has it about once a week. He reports he does not take anything on a daily basis for  it, just occasional tums if needed.   He denies any hematemesis, irregular bowel patterns. However he does endorse diarrhea that has been common for him over the years and reports it was worse while he was on crestor which he is no longer taking. He reports that in the past he would have 1 drink every night after work but in recent years he may have 1-2 occasional drinks (usually liquor over beer) a week or a month. Is not a smoker.   He does report that he intermittently has difficulty with medications feeling they are stuck at the bottom of his sternum which eventually goes away. This has been going on intermittently over 4 years. He does like the idea of having an endoscopy for further evaluation.   Wife [name] at bedside, provided bits of history as well.    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 Authorizing Provider  Armodafinil 150 MG tablet TAKE 1/2 TABLET (75 MG TOTAL) BY MOUTH DAILY. Patient taking differently: Take 75 mg by mouth daily. 03/28/21  Yes Eappen, Ria Clock, MD  hydrOXYzine (VISTARIL) 25 MG capsule TAKE 1-2 CAPSULES BY MOUTH AT BEDTIME AS NEEDED. FOR SEVERE ANXIETY , RACING THOUGHTS, SLEEP Patient taking  differently: Take 25-50 mg by mouth at bedtime as needed for anxiety. 02/06/21  Yes Ursula Alert, MD    Current Facility-Administered Medications  Medication Dose Route Frequency Provider Last Rate Last Admin   enoxaparin (LOVENOX) injection 40 mg  40 mg Subcutaneous Q24H Adefeso, Oladapo, DO   40 mg at 04/23/21 1017   ketorolac (TORADOL) 15 MG/ML injection 15 mg  15 mg Intravenous Q6H PRN Emokpae, Courage, MD       nicotine (NICODERM CQ - dosed in mg/24 hours) patch 21 mg  21 mg Transdermal Daily Emokpae, Courage, MD   21 mg at  04/23/21 1148   ondansetron (ZOFRAN) injection 4 mg  4 mg Intravenous Q6H PRN Adefeso, Oladapo, DO       pantoprazole (PROTONIX) injection 40 mg  40 mg Intravenous Q24H Adefeso, Oladapo, DO   40 mg at 04/23/21 1017    Allergies as of 04/23/2021 - Review Complete 04/23/2021  Allergen Reaction Noted   Flomax [tamsulosin hcl] Rash 05/17/2015    Family History  Problem Relation Age of Onset   Kidney failure Father     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  Tobacco Use   Smoking status: Never   Smokeless tobacco: Former    Types: Chew    Quit date: 06/2020  Vaping Use   Vaping Use: Never used  Substance and Sexual Activity   Alcohol use: Yes    Alcohol/week: 1.0 standard drink    Types: 1 Standard drinks or equivalent per week    Comment: occassional   Drug use: No   Sexual activity: Yes  Other Topics Concern   Not on file  Social History Narrative   Not on file   Social Determinants of Health   Financial Resource Strain: Not on file  Food Insecurity: Not on file  Transportation Needs: Not on file  Physical Activity: Not on file  Stress: Not on file  Social Connections: Not on file  Intimate Partner Violence: Not on file     Review of Systems   Gen: Denies any fever, chills, loss of appetite, change in weight or weight loss CV: Denies chest pain, heart palpitations, syncope, edema  Resp: Denies shortness of breath with rest, cough, wheezing, coughing up blood, and pleurisy. GI: see HPI GU : Denies urinary burning, blood in urine, urinary frequency, and urinary incontinence. MS: Denies joint pain, limitation of movement, swelling, cramps, and atrophy.  Derm: Denies rash, itching, dry skin, hives. Psych: Denies  depression, anxiety, memory loss, hallucinations, and confusion. Heme: Denies bruising or bleeding Neuro:  Denies any headaches, dizziness, paresthesias, shaking  Physical Exam   Vital Signs in last 24 hours: Temp:  [97.5 F (36.4 C)-98.7 F (37.1 C)] 98.7 F (37.1 C) (02/27 0458) Pulse Rate:  [62-95] 95 (02/27 0458) Resp:  [10-42] 15 (02/27 0458) BP: (121-129)/(74-85) 128/75 (02/27 0458) SpO2:  [93 %-100 %] 97 % (02/27 0458) Weight:  [97.5 kg] 97.5 kg (02/27 0055) Last BM Date :  (pta)  General:   Alert,  Well-developed, well-nourished, pleasant and cooperative in NAD Head:  Normocephalic and atraumatic. Eyes:  Sclera clear, no icterus.   Conjunctiva pink. Ears:  Normal auditory acuity. Mouth:  No deformity or lesions,  dentition normal. Neck:  Supple; no masses Lungs:  Clear throughout to auscultation.   No wheezes, crackles, or rhonchi. No acute distress. Heart:  Regular rate and rhythm; no murmurs, clicks, rubs,  or gallops. Abdomen:  Soft, mildly distended, TTP in epigastric and mid RUQ without rebound or guarding. No masses, hepatosplenomegaly or hernias noted. Normal bowel sounds.  Rectal: deferred Msk:  Symmetrical without gross deformities. Normal posture. Extremities:  Without clubbing or edema. Neurologic:  Alert and  oriented x4. Skin:  Intact without significant lesions or rashes. Psych:  Alert and cooperative. Normal mood and affect.  Intake/Output from previous day: 02/26 0701 - 02/27 0700 In: 234 [I.V.:234] Out: -  Intake/Output this shift: Total I/O In: 480 [P.O.:480] Out: 150 [Urine:150]  Labs/Studies   Recent Labs Recent Labs    04/23/21 0101  WBC 6.2  HGB 14.1  HCT 41.0  PLT 175   BMET Recent Labs    04/23/21 0101  NA 139  K 3.7  CL 102  CO2 30  GLUCOSE 109*  BUN 24*  CREATININE 1.02  CALCIUM 9.0   LFT Recent Labs    04/23/21 0101  PROT 7.4  ALBUMIN 4.1  AST 95*  ALT 72*  ALKPHOS 63  BILITOT 1.0   PT/INR No results for  input(s): LABPROT, INR in the last 72 hours. Hepatitis Panel No results for input(s): HEPBSAG, HCVAB, HEPAIGM, HEPBIGM in the last 72 hours. C-Diff No results for input(s): CDIFFTOX in the last 72 hours.  Radiology/Studies CT ABDOMEN PELVIS W CONTRAST  Result Date: 04/23/2021 CLINICAL DATA:  Acute pancreatitis. EXAM: CT ABDOMEN AND PELVIS WITH CONTRAST TECHNIQUE: Multidetector CT imaging of the abdomen and pelvis was performed using the standard protocol following bolus administration of intravenous contrast. RADIATION DOSE REDUCTION: This exam was performed according to the departmental dose-optimization program which includes automated exposure control, adjustment of the mA and/or kV according to patient size and/or use of iterative reconstruction technique. CONTRAST:  15m OMNIPAQUE IOHEXOL 300 MG/ML  SOLN COMPARISON:  CT abdomen pelvis dated 12/16/2019. FINDINGS: Lower chest: The visualized lung bases are clear. No intra-abdominal free air or free fluid. Hepatobiliary: Mild fatty liver. No intrahepatic biliary dilatation. The gallbladder is unremarkable. Pancreas: Unremarkable. No pancreatic ductal dilatation or surrounding inflammatory changes. Spleen: Normal in size without focal abnormality. Adrenals/Urinary Tract: The adrenal glands unremarkable. The kidneys, visualized ureters, and urinary bladder appear unremarkable. Stomach/Bowel: There is sigmoid diverticulosis and scattered colonic diverticula without active inflammatory changes. There is no bowel obstruction or active inflammation. The appendix is normal. Vascular/Lymphatic: The abdominal aorta and IVC unremarkable. No portal venous gas. There is no adenopathy. Reproductive: The prostate and seminal vesicles are grossly unremarkable. No pelvic mass. Other: None Musculoskeletal: No acute or significant osseous findings. IMPRESSION: 1. No acute intra-abdominal or pelvic pathology. 2. Colonic diverticulosis. No bowel obstruction. Normal  appendix. 3. Mild fatty liver. Electronically Signed   By: AAnner CreteM.D.   On: 04/23/2021 02:56   [place]domen Limited  Addendum Date: 04/23/2021   ADDENDUM REPORT: 04/23/2021 12:07 ADDENDUM: The common bile duct measures 6.5 mm which is borderline. The patient's bilirubin was within normal limits today. Recommend clinical correlation. Electronically Signed   By: DDorise BullionIII M.D.   On: 04/23/2021 12:07   Result Date: 04/23/2021 CLINICAL DATA:  Abdominal pain with nausea and vomiting. EXAM: ULTRASOUND ABDOMEN LIMITED RIGHT UPPER QUADRANT COMPARISON:  None. FINDINGS: Gallbladder: Sludge fills most of the gallbladder. Gallbladder wall thickening of 5 mm identified. There is a  small amount pericholecystic fluid is well. Gallbladder wall may be edematous. No Murphy's sign. No stones identified. Common bile duct: Diameter: 6.5 mm Liver: Increased heterogeneous echotexture. No focal mass. Portal vein is patent on color Doppler imaging with normal direction of blood flow towards the liver. Other: None. IMPRESSION: 1. The gallbladder is nearly filled with sludge with gallbladder wall thickening. The gallbladder wall also appears somewhat edematous and there is a small amount of pericholecystic fluid. No stones or Murphy's sign. Acute cholecystitis not excluded. If the clinical picture remains ambiguous, recommend a HIDA scan. 2. Probable hepatic steatosis. Electronically Signed: By: Dorise Bullion III M.D. On: 04/23/2021 10:55     Assessment   Dale Davis is a 46 y.o. year old male a history of Anxiety, Depression, and kidney stones who presents with abdominal pain, mild dysphagia, and elevated LFTs.   Transaminitis: AST 95, ALT 72, Alk phos normal, total bili 1. LDL 192, Cholesterol 261, Triglycerides normal. RUQ U/S without evidence of gallstones however the gallbladder is filled with sludge, with wall thickening. Since RUQ is inconclusive will proceed with HIDA scan for biliary cause of  elevated LFTs vs elevation alone from fatty liver.   Dysphagia: Has been having intermittent episodes for months of sensation that food/meds are stuck in his throat. Could be esophageal stricture vs GERD. Recommend EGD for further evaluation, possible outpatient pending EGD evaluation for abdominal pain.    Abdominal Pain: Lipase elevated to 384 however no evidence of pancreatitis on CT.Given RUQ U/S with evidence of gallbladder wall thickening, mostly filled with sludge, and small amount of pericholecystic fluid pancreatitis not likely and patient does not fully fit the clinical picture. Will be NPO for HIDA scan. Pending HIDA scan, will likely need EGD (outpatient vs inpatient) for evaluation of dysphagia and PUD.    Plan / Recommendations   Increase Pantoprazole to 40 mg BID.  HIDA scan, NPO pending results. EGD inpatient vs outpatient pending other results. Avoid all NSAIDs, will need to follow low fat diet.      04/23/2021, 1:12 PM  Venetia Night, MSN, FNP-BC, AGACNP-BC [facility]

## 2021-04-23 NOTE — Progress Notes (Signed)
Patient seen and evaluated, chart reviewed, please see EMR for updated orders. Please see full H&P dictated by admitting physician Dr Josephine Cables for same date of service.   - Brief Summary:- 46 y.o. male with medical history significant of HLD, depression/anxiety, admitted on 04/23/2021 with epigastric and upper abdominal pain with concerns for possible gallbladder disease versus PUD   A/p 1)Upper abdominal pain--- gallbladder related versus PUD versus gastritis versus food poisoning -Abdominal pain started a couple of hours after he ate [place] food at [employer] -Patient admits to frequent Advil use recently -GI input appreciated -RUQ ultrasound with sludge and gallbladder wall thickening, HIDA scan pending -CT abdomen and pelvis without acute finding -May need EGD -Avoid further narcotics until HIDA scan is read in a.m. -IV Protonix, IV Dilaudid and IV Zofran as ordered  2) elevated lipase--- CT abdomen and pelvis with normal pancreatic findings -- Doubt significant pancreatitis  -patient denies significant EtOH use - 3) elevated LFTS--- CT abdomen and pelvis and liver ultrasound noted -HIDA scan pending -CBD appears borderline dilated -Check for hepatitis profile  4)Tobacco Abuse--patient dips tobacco, he does not smoke- -Nicotine patch as ordered  5)HLD-LDL is 193, total cholesterol 261 HDL is 40 -Patient will need statins  -Total care time is 43 minutes  -- Plan of care discussed with patient and his wife at bedside, questions answered  Roxan Hockey, MD

## 2021-04-24 ENCOUNTER — Encounter (HOSPITAL_COMMUNITY)
Admission: EM | Disposition: A | Discharge status: Home / Self Care | Payer: Self-pay | Source: Home / Self Care | Attending: Family Medicine

## 2021-04-24 ENCOUNTER — Inpatient Hospital Stay (HOSPITAL_COMMUNITY): Payer: Managed Care, Other (non HMO)

## 2021-04-24 ENCOUNTER — Inpatient Hospital Stay (HOSPITAL_COMMUNITY): Payer: Managed Care, Other (non HMO) | Admitting: Anesthesiology

## 2021-04-24 DIAGNOSIS — K859 Acute pancreatitis without necrosis or infection, unspecified: Secondary | ICD-10-CM

## 2021-04-24 DIAGNOSIS — K831 Obstruction of bile duct: Secondary | ICD-10-CM | POA: Diagnosis present

## 2021-04-24 HISTORY — PX: ESOPHAGOGASTRODUODENOSCOPY (EGD) WITH PROPOFOL: SHX5813

## 2021-04-24 HISTORY — PX: ERCP: SHX5425

## 2021-04-24 HISTORY — PX: ESOPHAGEAL DILATION: SHX303

## 2021-04-24 LAB — CBC
HCT: 40.3 % (ref 39.0–52.0)
Hemoglobin: 13.8 g/dL (ref 13.0–17.0)
MCH: 30.3 pg (ref 26.0–34.0)
MCHC: 34.2 g/dL (ref 30.0–36.0)
MCV: 88.6 fL (ref 80.0–100.0)
Platelets: 161 10*3/uL (ref 150–400)
RBC: 4.55 MIL/uL (ref 4.22–5.81)
RDW: 12.1 % (ref 11.5–15.5)
WBC: 8.5 10*3/uL (ref 4.0–10.5)
nRBC: 0 % (ref 0.0–0.2)

## 2021-04-24 LAB — GLUCOSE, CAPILLARY
Glucose-Capillary: 119 mg/dL — ABNORMAL HIGH (ref 70–99)
Glucose-Capillary: 132 mg/dL — ABNORMAL HIGH (ref 70–99)
Glucose-Capillary: 133 mg/dL — ABNORMAL HIGH (ref 70–99)
Glucose-Capillary: 83 mg/dL (ref 70–99)
Glucose-Capillary: 89 mg/dL (ref 70–99)

## 2021-04-24 LAB — COMPREHENSIVE METABOLIC PANEL
ALT: 243 U/L — ABNORMAL HIGH (ref 0–44)
AST: 169 U/L — ABNORMAL HIGH (ref 15–41)
Albumin: 3.9 g/dL (ref 3.5–5.0)
Alkaline Phosphatase: 91 U/L (ref 38–126)
Anion gap: 8 (ref 5–15)
BUN: 18 mg/dL (ref 6–20)
CO2: 27 mmol/L (ref 22–32)
Calcium: 9 mg/dL (ref 8.9–10.3)
Chloride: 101 mmol/L (ref 98–111)
Creatinine, Ser: 0.86 mg/dL (ref 0.61–1.24)
GFR, Estimated: >60 mL/min (ref >60)
Glucose, Bld: 128 mg/dL — ABNORMAL HIGH (ref 70–99)
Potassium: 4 mmol/L (ref 3.5–5.1)
Sodium: 136 mmol/L (ref 135–145)
Total Bilirubin: 6.4 mg/dL — ABNORMAL HIGH (ref 0.3–1.2)
Total Protein: 7.3 g/dL (ref 6.5–8.1)

## 2021-04-24 LAB — HEPATIC FUNCTION PANEL
ALT: 241 U/L — ABNORMAL HIGH (ref 0–44)
ALT: 260 U/L — ABNORMAL HIGH (ref 0–44)
AST: 173 U/L — ABNORMAL HIGH (ref 15–41)
AST: 189 U/L — ABNORMAL HIGH (ref 15–41)
Albumin: 3.9 g/dL (ref 3.5–5.0)
Albumin: 3.9 g/dL (ref 3.5–5.0)
Alkaline Phosphatase: 91 U/L (ref 38–126)
Alkaline Phosphatase: 97 U/L (ref 38–126)
Bilirubin, Direct: 3.4 mg/dL — ABNORMAL HIGH (ref 0.0–0.2)
Bilirubin, Direct: 4.4 mg/dL — ABNORMAL HIGH (ref 0.0–0.2)
Indirect Bilirubin: 3 mg/dL — ABNORMAL HIGH (ref 0.3–0.9)
Indirect Bilirubin: 3.3 mg/dL — ABNORMAL HIGH (ref 0.3–0.9)
Total Bilirubin: 6.4 mg/dL — ABNORMAL HIGH (ref 0.3–1.2)
Total Bilirubin: 7.7 mg/dL — ABNORMAL HIGH (ref 0.3–1.2)
Total Protein: 7.2 g/dL (ref 6.5–8.1)
Total Protein: 7.2 g/dL (ref 6.5–8.1)

## 2021-04-24 LAB — LIPASE, BLOOD: Lipase: 1102 U/L — ABNORMAL HIGH (ref 11–51)

## 2021-04-24 LAB — TRIGLYCERIDES: Triglycerides: 91 mg/dL (ref <150)

## 2021-04-24 SURGERY — EGD (ESOPHAGOGASTRODUODENOSCOPY)
Anesthesia: General

## 2021-04-24 SURGERY — ERCP, WITH INTERVENTION IF INDICATED
Anesthesia: General

## 2021-04-24 MED ORDER — PROPOFOL 10 MG/ML IV BOLUS
INTRAVENOUS | Status: AC
  Filled 2021-04-24: qty 20

## 2021-04-24 MED ORDER — PROPOFOL 10 MG/ML IV BOLUS
INTRAVENOUS | Status: DC | PRN
  Administered 2021-04-24: 200 mg via INTRAVENOUS

## 2021-04-24 MED ORDER — SODIUM CHLORIDE 0.9 % IV SOLN
2.0000 g | INTRAVENOUS | Status: DC
  Administered 2021-04-24 – 2021-04-26 (×3): 2 g via INTRAVENOUS
  Filled 2021-04-24 (×3): qty 20

## 2021-04-24 MED ORDER — GLYCOPYRROLATE 0.2 MG/ML IJ SOLN
0.2000 mg | Freq: Once | INTRAMUSCULAR | Status: AC
  Administered 2021-04-24: 0.1 mg via INTRAVENOUS

## 2021-04-24 MED ORDER — ACETAMINOPHEN 325 MG PO TABS: 650.0000 mg | ORAL_TABLET | Freq: Four times a day (QID) | ORAL | Status: DC | PRN

## 2021-04-24 MED ORDER — LACTATED RINGERS IV SOLN: INTRAVENOUS | Status: DC | PRN

## 2021-04-24 MED ORDER — ROCURONIUM BROMIDE 10 MG/ML (PF) SYRINGE
PREFILLED_SYRINGE | INTRAVENOUS | Status: DC | PRN
Start: 2021-04-24 — End: 2021-04-24
  Administered 2021-04-24: 45 mg via INTRAVENOUS
  Administered 2021-04-24: 5 mg via INTRAVENOUS

## 2021-04-24 MED ORDER — LACTATED RINGERS IV SOLN: INTRAVENOUS | Status: DC

## 2021-04-24 MED ORDER — KETOROLAC TROMETHAMINE 30 MG/ML IJ SOLN
30.0000 mg | Freq: Once | INTRAMUSCULAR | Status: AC
  Administered 2021-04-24: 30 mg via INTRAVENOUS
  Filled 2021-04-24: qty 1

## 2021-04-24 MED ORDER — ONDANSETRON HCL 4 MG/2ML IJ SOLN: 4.0000 mg | Freq: Once | INTRAMUSCULAR | Status: DC | PRN

## 2021-04-24 MED ORDER — DEXAMETHASONE SODIUM PHOSPHATE 10 MG/ML IJ SOLN
INTRAMUSCULAR | Status: DC | PRN
  Administered 2021-04-24: 10 mg via INTRAVENOUS

## 2021-04-24 MED ORDER — DEXTROSE 50 % IV SOLN
50.0000 mL | Freq: Once | INTRAVENOUS | Status: AC
  Administered 2021-04-24: 50 mL via INTRAVENOUS
  Filled 2021-04-24: qty 50

## 2021-04-24 MED ORDER — STERILE WATER FOR IRRIGATION IR SOLN
Status: DC | PRN
  Administered 2021-04-24: 1000 mL

## 2021-04-24 MED ORDER — HYDROMORPHONE HCL 1 MG/ML IJ SOLN
INTRAMUSCULAR | Status: AC
  Filled 2021-04-24: qty 0.5

## 2021-04-24 MED ORDER — FENTANYL CITRATE (PF) 100 MCG/2ML IJ SOLN
INTRAMUSCULAR | Status: AC
  Filled 2021-04-24: qty 2

## 2021-04-24 MED ORDER — LIDOCAINE HCL (CARDIAC) PF 100 MG/5ML IV SOSY
PREFILLED_SYRINGE | INTRAVENOUS | Status: DC | PRN
  Administered 2021-04-24: 100 mg via INTRATRACHEAL

## 2021-04-24 MED ORDER — HYDROMORPHONE HCL 1 MG/ML IJ SOLN
0.2500 mg | INTRAMUSCULAR | Status: DC | PRN
  Administered 2021-04-24: 0.5 mg via INTRAVENOUS

## 2021-04-24 MED ORDER — LIDOCAINE HCL (PF) 2 % IJ SOLN
INTRAMUSCULAR | Status: AC
  Filled 2021-04-24: qty 5

## 2021-04-24 MED ORDER — SUCCINYLCHOLINE CHLORIDE 200 MG/10ML IV SOSY
PREFILLED_SYRINGE | INTRAVENOUS | Status: AC
  Filled 2021-04-24: qty 10

## 2021-04-24 MED ORDER — GLUCAGON HCL RDNA (DIAGNOSTIC) 1 MG IJ SOLR
INTRAMUSCULAR | Status: DC | PRN
  Administered 2021-04-24: .25 mg via INTRAVENOUS

## 2021-04-24 MED ORDER — MEPERIDINE HCL 50 MG/ML IJ SOLN: 6.2500 mg | INTRAMUSCULAR | Status: DC | PRN

## 2021-04-24 MED ORDER — ONDANSETRON HCL 4 MG/2ML IJ SOLN
INTRAMUSCULAR | Status: DC | PRN
  Administered 2021-04-24: 4 mg via INTRAVENOUS

## 2021-04-24 MED ORDER — HYDROMORPHONE HCL 1 MG/ML IJ SOLN
1.0000 mg | INTRAMUSCULAR | Status: DC | PRN
  Administered 2021-04-24 – 2021-04-26 (×11): 1 mg via INTRAVENOUS
  Filled 2021-04-24 (×11): qty 1

## 2021-04-24 MED ORDER — DEXTROSE-NACL 5-0.45 % IV SOLN: INTRAVENOUS | Status: DC

## 2021-04-24 MED ORDER — KETOROLAC TROMETHAMINE 30 MG/ML IJ SOLN: 30.0000 mg | Freq: Four times a day (QID) | INTRAMUSCULAR | Status: DC | PRN

## 2021-04-24 MED ORDER — SUCCINYLCHOLINE CHLORIDE 200 MG/10ML IV SOSY
PREFILLED_SYRINGE | INTRAVENOUS | Status: DC | PRN
Start: 2021-04-24 — End: 2021-04-24
  Administered 2021-04-24: 160 mg via INTRAVENOUS

## 2021-04-24 MED ORDER — MIDAZOLAM HCL 5 MG/5ML IJ SOLN
INTRAMUSCULAR | Status: DC | PRN
Start: 2021-04-24 — End: 2021-04-24
  Administered 2021-04-24: 2 mg via INTRAVENOUS

## 2021-04-24 MED ORDER — GLYCOPYRROLATE PF 0.2 MG/ML IJ SOSY
PREFILLED_SYRINGE | INTRAMUSCULAR | Status: DC | PRN
  Administered 2021-04-24: .1 mg via INTRAVENOUS

## 2021-04-24 MED ORDER — ROCURONIUM BROMIDE 10 MG/ML (PF) SYRINGE
PREFILLED_SYRINGE | INTRAVENOUS | Status: AC
  Filled 2021-04-24: qty 10

## 2021-04-24 MED ORDER — ONDANSETRON HCL 4 MG/2ML IJ SOLN
INTRAMUSCULAR | Status: AC
  Filled 2021-04-24: qty 2

## 2021-04-24 MED ORDER — SODIUM CHLORIDE 0.9 % IV SOLN
INTRAVENOUS | Status: DC | PRN
  Administered 2021-04-24: 16 mL

## 2021-04-24 MED ORDER — GLYCOPYRROLATE 0.2 MG/ML IJ SOLN
INTRAMUSCULAR | Status: AC
  Filled 2021-04-24: qty 1

## 2021-04-24 MED ORDER — GLUCAGON HCL RDNA (DIAGNOSTIC) 1 MG IJ SOLR
INTRAMUSCULAR | Status: AC
  Filled 2021-04-24: qty 2

## 2021-04-24 MED ORDER — SODIUM CHLORIDE FLUSH 0.9 % IV SOLN
INTRAVENOUS | Status: AC
  Filled 2021-04-24: qty 10

## 2021-04-24 MED ORDER — PHENYLEPHRINE 40 MCG/ML (10ML) SYRINGE FOR IV PUSH (FOR BLOOD PRESSURE SUPPORT)
PREFILLED_SYRINGE | INTRAVENOUS | Status: DC | PRN
  Administered 2021-04-24: 80 ug via INTRAVENOUS
  Administered 2021-04-24: 200 ug via INTRAVENOUS

## 2021-04-24 MED ORDER — PROCHLORPERAZINE EDISYLATE 10 MG/2ML IJ SOLN
10.0000 mg | Freq: Once | INTRAMUSCULAR | Status: AC
  Administered 2021-04-24: 10 mg via INTRAVENOUS
  Filled 2021-04-24: qty 2

## 2021-04-24 MED ORDER — FENTANYL CITRATE (PF) 100 MCG/2ML IJ SOLN
INTRAMUSCULAR | Status: DC | PRN
  Administered 2021-04-24 (×2): 50 ug via INTRAVENOUS

## 2021-04-24 MED ORDER — MIDAZOLAM HCL 2 MG/2ML IJ SOLN
INTRAMUSCULAR | Status: AC
  Filled 2021-04-24: qty 2

## 2021-04-24 MED ORDER — SUGAMMADEX SODIUM 200 MG/2ML IV SOLN
INTRAVENOUS | Status: DC | PRN
Start: 2021-04-24 — End: 2021-04-24
  Administered 2021-04-24: 300 mg via INTRAVENOUS

## 2021-04-24 MED ORDER — MEPERIDINE HCL 50 MG/ML IJ SOLN
INTRAMUSCULAR | Status: AC
  Filled 2021-04-24: qty 1

## 2021-04-24 SURGICAL SUPPLY — 34 items
BALLN CRE LF 10-12 240X5.5 (BALLOONS)
BALLN DILATOR CRE 12-15 240 (BALLOONS)
BALLN DILATOR CRE 15-18 240 (BALLOONS) IMPLANT
BALLN DILATOR CRE 18-20 240 (BALLOONS) IMPLANT
BALLN DILATOR CRE WIREGUIDE (BALLOONS)
BALLN RETRIEVAL 12X15 (BALLOONS) IMPLANT
BALLOON CRE LF 10-12 240X5.5 (BALLOONS) IMPLANT
BALLOON DILATOR CRE 12-15 240 (BALLOONS) IMPLANT
BALLOON DILATOR CRE WIREGUIDE (BALLOONS) IMPLANT
BALN RTRVL 200 6-7FR 12-15 (BALLOONS)
BASKET TRAPEZOID 3X6 (MISCELLANEOUS) IMPLANT
BASKET TRAPEZOID LITHO 2.0X5 (MISCELLANEOUS) ×1 IMPLANT
BSKT STON RTRVL TRAPEZOID 2X5 (MISCELLANEOUS)
BSKT STON RTRVL TRAPEZOID 3X6 (MISCELLANEOUS)
DEVICE INFLATION ENCORE 26 (MISCELLANEOUS) IMPLANT
DEVICE LOCKING W-BIOPSY CAP (MISCELLANEOUS) ×2 IMPLANT
GUIDEWIRE HYDRA JAGWIRE .35 (WIRE) IMPLANT
GUIDEWIRE JAG HINI 025X260CM (WIRE) IMPLANT
KIT ENDO PROCEDURE PEN (KITS) ×1 IMPLANT
KIT TURNOVER KIT A (KITS) ×1 IMPLANT
LUBRICANT JELLY 4.5OZ STERILE (MISCELLANEOUS) IMPLANT
PAD ARMBOARD 7.5X6 YLW CONV (MISCELLANEOUS) ×1 IMPLANT
POSITIONER HEAD 8X9X4 ADT (SOFTGOODS) IMPLANT
SCOPE SPY DS DISPOSABLE (MISCELLANEOUS) ×1 IMPLANT
SNARE ROTATE MED OVAL 20MM (MISCELLANEOUS) IMPLANT
SNARE SHORT THROW 13M SML OVAL (MISCELLANEOUS) IMPLANT
SPHINCTEROTOME AUTOTOME .25 (MISCELLANEOUS) IMPLANT
SPHINCTEROTOME HYDRATOME 44 (MISCELLANEOUS) ×1 IMPLANT
SYR INFLATE BILIARY GAUGE (MISCELLANEOUS) IMPLANT
SYSTEM CONTINUOUS INJECTION (MISCELLANEOUS) ×1 IMPLANT
TUBING INSUFFLATOR CO2MPACT (TUBING) ×1 IMPLANT
WALLSTENT METAL COVERED 10X60 (STENTS) IMPLANT
WALLSTENT METAL COVERED 10X80 (STENTS) IMPLANT
WATER STERILE IRR 1000ML POUR (IV SOLUTION) ×2 IMPLANT

## 2021-04-24 NOTE — Progress Notes (Addendum)
PROGRESS NOTE     Dale Davis, is a 46 y.o. male, DOB - 30-Jul-1975, CXK:[id number]  Admit date - 04/23/2021   Admitting Physician Bernadette Hoit, DO  Outpatient Primary MD for the patient is Garnette Gunner Coralie Keens, NP  LOS - 1  Chief Complaint  Patient presents with   Abdominal Pain         Brief Summary:- 46 y.o. male with medical history significant of HLD, depression/anxiety, admitted on 04/23/2021 with epigastric and upper abdominal pain with concerns for gallstone pancreatitis and biliary obstruction    -Assessment and Plan: 1)Upper abdominal pain--- gallbladder related versus PUD  -CT abdomen and pelvis as well as right upper quadrant ultrasound reviewed -HIDA scan reviewed with Dr Thornton Papas -Lipase trending up, LFTs trending up especially bilirubin -Suspect gallstone pancreatitis in the setting of biliary obstruction -Plan for ERCP on 04/24/2021 -GI consult appreciated -Patient will need elective cholecystectomy in the near future -IV Rocephin for possible cholangitis -Continue pain control and antiemetics -Continue Protonix -Continue IV fluids until oral intake is more reliable -General surgery consult appreciated  2)Tobacco abuse--chews tobacco, does not really smoke, continue nicotine patch  3)HLD- pt's LDL is 193, total cholesterol 261 HDL is 40 -Patient will need statins after resolution of acute hepatic injury    Disposition/Need for in-Hospital Stay- patient unable to be discharged at this time due to --gust epiglottitis in the setting of biliary obstruction requiring IV fluids and IV M PMHx and IV pain control--pending EGD/ERCP -May need elective cholecystectomy  Status is: Inpatient   Disposition: The patient is from: Home              Anticipated d/c is to: Home              Anticipated d/c date is: 2 days              Patient currently is not medically stable to d/c. Barriers: Not Clinically Stable-   Code Status :  -  Code Status: Full Code   Family  Communication:    (patient is alert, awake and coherent) Discussed with wife at bedside  DVT Prophylaxis  :   - SCDs   enoxaparin (LOVENOX) injection 40 mg Start: 04/23/21 1000 SCDs Start: 04/23/21 0419   Lab Results  Component Value Date   PLT 161 04/24/2021   Inpatient Medications  Scheduled Meds:  enoxaparin (LOVENOX) injection  40 mg Subcutaneous Q24H   glucagon (human recombinant)       nicotine  21 mg Transdermal Daily   pantoprazole (PROTONIX) IV  40 mg Intravenous Q12H   Continuous Infusions:  cefTRIAXone (ROCEPHIN)  IV     dextrose 5 % and 0.45% NaCl 125 mL/hr at 04/24/21 1728   PRN Meds:.acetaminophen, alum & mag hydroxide-simeth, HYDROmorphone (DILAUDID) injection, ketorolac, ondansetron (ZOFRAN) IV   Anti-infectives (From admission, onward)    Start     Dose/Rate Route Frequency Ordered Stop   04/24/21 1815  cefTRIAXone (ROCEPHIN) 2 g in sodium chloride 0.9 % 100 mL IVPB        2 g 200 mL/hr over 30 Minutes Intravenous Every 24 hours 04/24/21 1720           Subjective: Dale Davis today has no fevers,   No chest pain,   -Wife at bedside, questions answered -Abdominal pain and nausea persist but no emesis  Objective: Vitals:   04/24/21 0000 04/24/21 0248 04/24/21 0529 04/24/21 1216  BP: 100/85 137/76 132/88 (!) 142/81  Pulse: 77 61 70 64  Resp: 20 20 19 20   Temp: 98.7 F (37.1 C) 98.1 F (36.7 C) 97.7 F (36.5 C) 98.1 F (36.7 C)  TempSrc:  Oral  Oral  SpO2: 98% 100% 98% 99%  Weight:      Height:        Intake/Output Summary (Last 24 hours) at 04/24/2021 1741 Last data filed at 04/24/2021 1540 Gross per 24 hour  Intake 374.91 ml  Output --  Net 374.91 ml   Filed Weights   04/23/21 0055  Weight: 97.5 kg   Physical Exam  Gen:- Awake Alert,  in no apparent distress  HEENT:- [place].AT, +ve sclera icterus Neck-Supple Neck,No JVD,.  Lungs-  CTAB , fair symmetrical air movement CV- S1, S2 normal, regular  Abd-  +ve B.Sounds, Abd Soft,  epigastric and upper abdominal area tenderness without rebound or guarding,    Extremity/Skin:- No  edema, pedal pulses present  Psych-affect is appropriate, oriented x3 Neuro-no new focal deficits, no tremors  Data Reviewed: I have personally reviewed following labs and imaging studies  CBC: Recent Labs  Lab 04/23/21 0101 04/24/21 0559  WBC 6.2 8.5  HGB 14.1 13.8  HCT 41.0 40.3  MCV 88.2 88.6  PLT 175 962   Basic Metabolic Panel: Recent Labs  Lab 04/23/21 0101 04/23/21 0450 04/24/21 0559  NA 139  --  136  K 3.7  --  4.0  CL 102  --  101  CO2 30  --  27  GLUCOSE 109*  --  128*  BUN 24*  --  18  CREATININE 1.02  --  0.86  CALCIUM 9.0  --  9.0  MG  --  2.1  --   PHOS  --  2.8  --    GFR: Estimated Creatinine Clearance: 127.8 mL/min (by C-G formula based on SCr of 0.86 mg/dL). Liver Function Tests: Recent Labs  Lab 04/23/21 0101 04/24/21 0559 04/24/21 1300  AST 95* 173*   169* 189*  ALT 72* 241*   243* 260*  ALKPHOS 63 91   91 97  BILITOT 1.0 6.4*   6.4* 7.7*  PROT 7.4 7.2   7.3 7.2  ALBUMIN 4.1 3.9   3.9 3.9   Cardiac Enzymes: No results for input(s): CKTOTAL, CKMB, CKMBINDEX, TROPONINI in the last 168 hours. BNP (last 3 results) No results for input(s): PROBNP in the last 8760 hours. HbA1C: No results for input(s): HGBA1C in the last 72 hours. Sepsis Labs: @LABRCNTIP (procalcitonin:4,lacticidven:4) ) Recent Results (from the past 240 hour(s))  Resp Panel by RT-PCR (Flu A&B, Covid) Nasopharyngeal Swab     Status: None   Collection Time: 04/23/21  3:25 AM   Specimen: Nasopharyngeal Swab; Nasopharyngeal(NP) swabs in vial transport medium  Result Value Ref Range Status   SARS Coronavirus 2 by RT PCR NEGATIVE NEGATIVE Final    Comment: (NOTE) SARS-CoV-2 target nucleic acids are NOT DETECTED.  The SARS-CoV-2 RNA is generally detectable in upper respiratory specimens during the acute phase of infection. The lowest concentration of SARS-CoV-2 viral copies  this assay can detect is 138 copies/mL. A negative result does not preclude SARS-Cov-2 infection and should not be used as the sole basis for treatment or other patient management decisions. A negative result may occur with  improper specimen collection/handling, submission of specimen other than nasopharyngeal swab, presence of viral mutation(s) within the areas targeted by this assay, and inadequate number of viral copies(<138 copies/mL). A negative result must be combined with clinical observations, patient history, and epidemiological information. The expected  result is Negative.  Fact Sheet for Patients:  EntrepreneurPulse.com.au  Fact Sheet for Healthcare Providers:  IncredibleEmployment.be  This test is no t yet approved or cleared by the [place] FDA and  has been authorized for detection and/or diagnosis of SARS-CoV-2 by FDA under an Emergency Use Authorization (EUA). This EUA will remain  in effect (meaning this test can be used) for the duration of the COVID-19 declaration under Section 564(b)(1) of the Act, 21 U.S.C.section 360bbb-3(b)(1), unless the authorization is terminated  or revoked sooner.       Influenza A by PCR NEGATIVE NEGATIVE Final   Influenza B by PCR NEGATIVE NEGATIVE Final    Comment: (NOTE) The Xpert Xpress SARS-CoV-2/FLU/RSV plus assay is intended as an aid in the diagnosis of influenza from Nasopharyngeal swab specimens and should not be used as a sole basis for treatment. Nasal washings and aspirates are unacceptable for Xpert Xpress SARS-CoV-2/FLU/RSV testing.  Fact Sheet for Patients: EntrepreneurPulse.com.au  Fact Sheet for Healthcare Providers: IncredibleEmployment.be  This test is not yet approved or cleared by the [place] FDA and has been authorized for detection and/or diagnosis of SARS-CoV-2 by FDA under an Emergency Use Authorization (EUA). This EUA  will remain in effect (meaning this test can be used) for the duration of the COVID-19 declaration under Section 564(b)(1) of the Act, 21 U.S.C. section 360bbb-3(b)(1), unless the authorization is terminated or revoked.  Performed at [facility] [place], [address], [place], [place] [address]       Radiology Studies: CT ABDOMEN PELVIS W CONTRAST  Result Date: 04/23/2021 CLINICAL DATA:  Acute pancreatitis. EXAM: CT ABDOMEN AND PELVIS WITH CONTRAST TECHNIQUE: Multidetector CT imaging of the abdomen and pelvis was performed using the standard protocol following bolus administration of intravenous contrast. RADIATION DOSE REDUCTION: This exam was performed according to the departmental dose-optimization program which includes automated exposure control, adjustment of the mA and/or kV according to patient size and/or use of iterative reconstruction technique. CONTRAST:  187mL OMNIPAQUE IOHEXOL 300 MG/ML  SOLN COMPARISON:  CT abdomen pelvis dated 12/16/2019. FINDINGS: Lower chest: The visualized lung bases are clear. No intra-abdominal free air or free fluid. Hepatobiliary: Mild fatty liver. No intrahepatic biliary dilatation. The gallbladder is unremarkable. Pancreas: Unremarkable. No pancreatic ductal dilatation or surrounding inflammatory changes. Spleen: Normal in size without focal abnormality. Adrenals/Urinary Tract: The adrenal glands unremarkable. The kidneys, visualized ureters, and urinary bladder appear unremarkable. Stomach/Bowel: There is sigmoid diverticulosis and scattered colonic diverticula without active inflammatory changes. There is no bowel obstruction or active inflammation. The appendix is normal. Vascular/Lymphatic: The abdominal aorta and IVC unremarkable. No portal venous gas. There is no adenopathy. Reproductive: The prostate and seminal vesicles are grossly unremarkable. No pelvic mass. Other: None Musculoskeletal: No acute or significant osseous findings. IMPRESSION: 1. No acute  intra-abdominal or pelvic pathology. 2. Colonic diverticulosis. No bowel obstruction. Normal appendix. 3. Mild fatty liver. Electronically Signed   By: Anner Crete M.D.   On: 04/23/2021 02:56   NM Hepato W/EF  Result Date: 04/24/2021 CLINICAL DATA:  Acute abdominal pain for 1 day, abnormal ultrasound demonstrating markedly thickened gallbladder wall, borderline CBD dilatation, and gallbladder sludge; increasing LFTs and pain EXAM: NUCLEAR MEDICINE HEPATOBILIARY IMAGING TECHNIQUE: Sequential images of the abdomen were obtained out to 60 minutes following intravenous administration of radiopharmaceutical. RADIOPHARMACEUTICALS:  5.5 mCi Tc-33m  Choletec IV COMPARISON:  CT abdomen and pelvis 04/23/2021, ultrasound abdomen 04/23/2021 FINDINGS: Adequate clearance of tracer from bloodstream indicating normal hepatocellular function. Markedly delayed excretion of tracer  into the biliary tree. At 1 hour, only a small amount of tracer within proximal CBD is noted. Gallbladder had not visualized. Patient received morphine following which the gallbladder visualized indicating patency of the cystic duct. In combination with the thickened gallbladder wall identified on ultrasound, this could represent chronic cholecystitis. Delayed image performed at 18 hours demonstrates marked persistent retention of tracer within the liver as well as tracer within the gallbladder. Only faint tracer is seen within bowel. Findings are consistent with high-grade CBD obstruction. IMPRESSION: Patent cystic duct. Only faint tracer is seen within the bowel at 18 hours, with most of tracer retained within hepatic parenchyma and gallbladder, consistent with a high-grade common bile duct obstruction. Consider MRCP imaging and/or ERCP. Electronically Signed   By: Lavonia Dana M.D.   On: 04/24/2021 09:10   US Abdomen Limited  Addendum Date: 04/23/2021   ADDENDUM REPORT: 04/23/2021 12:07 ADDENDUM: The common bile duct measures 6.5 mm which is  borderline. The patient's bilirubin was within normal limits today. Recommend clinical correlation. Electronically Signed   By: Dorise Bullion III M.D.   On: 04/23/2021 12:07   Result Date: 04/23/2021 CLINICAL DATA:  Abdominal pain with nausea and vomiting. EXAM: ULTRASOUND ABDOMEN LIMITED RIGHT UPPER QUADRANT COMPARISON:  None. FINDINGS: Gallbladder: Sludge fills most of the gallbladder. Gallbladder wall thickening of 5 mm identified. There is a small amount pericholecystic fluid is well. Gallbladder wall may be edematous. No Murphy's sign. No stones identified. Common bile duct: Diameter: 6.5 mm Liver: Increased heterogeneous echotexture. No focal mass. Portal vein is patent on color Doppler imaging with normal direction of blood flow towards the liver. Other: None. IMPRESSION: 1. The gallbladder is nearly filled with sludge with gallbladder wall thickening. The gallbladder wall also appears somewhat edematous and there is a small amount of pericholecystic fluid. No stones or Murphy's sign. Acute cholecystitis not excluded. If the clinical picture remains ambiguous, recommend a HIDA scan. 2. Probable hepatic steatosis. Electronically Signed: By: Dorise Bullion III M.D. On: 04/23/2021 10:55     Scheduled Meds:  enoxaparin (LOVENOX) injection  40 mg Subcutaneous Q24H   glucagon (human recombinant)       nicotine  21 mg Transdermal Daily   pantoprazole (PROTONIX) IV  40 mg Intravenous Q12H   Continuous Infusions:  cefTRIAXone (ROCEPHIN)  IV     dextrose 5 % and 0.45% NaCl 125 mL/hr at 04/24/21 1728     LOS: 1 day    Roxan Hockey M.D on 04/24/2021 at 5:41 PM  Go to [email or web address] - for contact info  [facility] - Office  [phone]  If 7PM-7AM, please contact night-coverage [email or web address] Password [facility] 04/24/2021, 5:41 PM

## 2021-04-24 NOTE — Op Note (Signed)
[facility] Patient Name: Dale Davis Procedure Date: 04/24/2021 5:02 PM MRN: [id number] Date of Birth: 1975/06/25 Attending MD: Hildred Laser , MD CSN: [id number] Age: 46 Admit Type: Inpatient Procedure:                ERCP Indications:              Suspected bile duct stone(s), For therapy of bile                            duct stone(s), Jaundice Providers:                Hildred Laser, MD, Lurline Del, RN, Casimer Bilis, Technician Referring MD:              Medicines:                General Anesthesia Complications:            No immediate complications. Estimated Blood Loss:     Estimated blood loss: none. Procedure:                Pre-Anesthesia Assessment:                           - Prior to the procedure, a History and Physical                            was performed, and patient medications and                            allergies were reviewed. The patient's tolerance of                            previous anesthesia was also reviewed. The risks                            and benefits of the procedure and the sedation                            options and risks were discussed with the patient.                            All questions were answered, and informed consent                            was obtained. Prior Anticoagulants: The patient                            last took Lovenox (enoxaparin) 1 day prior to the                            procedure. ASA Grade Assessment: II - A patient  with mild systemic disease. After reviewing the                            risks and benefits, the patient was deemed in                            satisfactory condition to undergo the procedure.                           After obtaining informed consent, the scope was                            passed under direct vision. Throughout the                            procedure, the patient's blood pressure, pulse, and                             oxygen saturations were monitored continuously. The                            [employer] single use                            duodenoscope was introduced through the mouth, and                            used to inject contrast into and used to inject                            contrast into the bile duct. The ERCP was                            accomplished without difficulty. The patient                            tolerated the procedure well. Scope In: 6:55:12 PM Scope Out: 7:10:14 PM Total Procedure Duration: 0 hours 15 minutes 2 seconds  Findings:      The scout film was normal. The esophagus was successfully intubated       under direct vision. The scope was advanced to a normal major papilla in       the descending duodenum without detailed examination of the pharynx,       larynx and associated structures, and upper GI tract. The upper GI tract       was grossly normal. The bile duct was deeply cannulated with the       Autotome sphincterotome. Contrast was injected. I personally interpreted       the bile duct images. Ductal flow of contrast was adequate. Image       quality was excellent. Contrast extended to the entire biliary tree. The       common bile duct and common hepatic duct were mildly dilated and       diffusely dilated. The biliary sphincterotomy was extended to a total of  8 mm in length with a braided Autotome sphincterotome using ERBE       electrocautery. There was no post-sphincterotomy bleeding. Black bile       gushed from bile duct. To discover objects, the biliary tree was swept       with a 9 mm balloon starting at the upper third of the main bile duct.       Sludge was swept from the duct. Impression:               - The common bile duct and common hepatic duct were                            mildly dilated without filling defect.                           - A biliary sphincterotomy was performed with  flow                            of black bile.                           - The biliary tree was swept and sludge was                            removed.. Moderate Sedation:      Per Anesthesia Care Recommendation:           - Avoid aspirin and nonsteroidal anti-inflammatory                            medicines for 3 days.                           - NPO today.                           - Continue present medications.                           - No Aspirin or anticoagulants. Procedure Code(s):        --- Professional ---                           [id number], Endoscopic retrograde                            cholangiopancreatography (ERCP); with removal of                            calculi/debris from biliary/pancreatic duct(s)                           43262, Endoscopic retrograde                            cholangiopancreatography (ERCP); with                            sphincterotomy/papillotomy  Diagnosis Code(s):        --- Professional ---                           K80.50, Calculus of bile duct without cholangitis                            or cholecystitis without obstruction                           R17, Unspecified jaundice                           K83.8, Other specified diseases of biliary tract CPT copyright 2019 American Medical Association. All rights reserved. The codes documented in this report are preliminary and upon coder review may  be revised to meet current compliance requirements. Hildred Laser, MD Hildred Laser, MD 04/24/2021 7:46:13 PM This report has been signed electronically. Number of Addenda: 0

## 2021-04-24 NOTE — Anesthesia Procedure Notes (Signed)
Procedure Name: Intubation Date/Time: 04/24/2021 6:28 PM Performed by: Denese Killings, MD Pre-anesthesia Checklist: Patient identified, Emergency Drugs available, Suction available and Patient being monitored Patient Re-evaluated:Patient Re-evaluated prior to induction Oxygen Delivery Method: Circle system utilized Preoxygenation: Pre-oxygenation with 100% oxygen Induction Type: IV induction Ventilation: Mask ventilation without difficulty Laryngoscope Size: Glidescope and 4 Tube type: Oral Tube size: 7.5 mm Number of attempts: 1 Airway Equipment and Method: Stylet and Oral airway Placement Confirmation: ETT inserted through vocal cords under direct vision, positive ETCO2 and breath sounds checked- equal and bilateral Secured at: 23 (at lip) cm Tube secured with: Tape Dental Injury: Teeth and Oropharynx as per pre-operative assessment

## 2021-04-24 NOTE — Consult Note (Signed)
[facility] Consult  Reason for Consult: Concern for cholecystitis Referring Physician: Dr. Denton Brick  Chief Complaint   Abdominal Pain     HPI: Dale Davis is a 46 y.o. male who was admitted to the hospital yesterday with a 3-day history of epigastric abdominal pain.  He states that the pain started on Saturday, and has been unrelenting since that time.  He tried taking Tums, Pepto-Bismol, and Aleve without improvement of his abdominal pain, which prompted him to proceed to the emergency department.  In the ED, he was noted to have a normal WBC of 6.2 and an elevated lipase at 384. He underwent abdominal CT which demonstrated no acute intra-abdominal process.  He subsequently underwent an abdominal ultrasound of the right upper quadrant which demonstrated sludge within the gallbladder with a small amount of pericholecystic fluid and gallbladder wall thickening, equivocal for acute cholecystitis.  On this imaging, his common bile duct was mildly dilated at 6.5 mm.  Today, he underwent HIDA scan which demonstrated a patent cystic duct but concern for a common bile duct obstruction.  His subsequent blood work today demonstrates a lipase of 1102, T. bili of 6.4, direct bilirubin of 3.4, and elevations of AST and ALT.  He has no history of abdominal surgeries.  He denies taking any medications at this time.  He is working on quitting smoking, occasionally uses smokeless tobacco, very rarely drinks alcohol, and denies illicit drug use.  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  Allergies  Allergen Reactions   Flomax [Tamsulosin Hcl] Rash     ROS:  Constitutional: negative for chills, fatigue, and fevers Respiratory: negative for cough, wheezing, and shortness of breath Cardiovascular: negative for chest pain and palpitations Gastrointestinal: positive for abdominal pain, negative for nausea and vomiting Musculoskeletal:negative for back pain and neck pain  Blood pressure (!) 142/81, pulse 64, temperature 98.1 F (36.7 C), temperature source Oral, resp. rate 20, height 5\' 11"  (1.803 m), weight 97.5 kg, SpO2 99 %. Physical Exam Vitals reviewed.  Constitutional:      Appearance: He is well-developed.  HENT:     Head: Normocephalic and atraumatic.  Cardiovascular:     Rate and Rhythm: Normal rate and regular rhythm.  Pulmonary:     Effort: Pulmonary effort is normal.     Breath sounds: Normal breath sounds.  Abdominal:     Comments: Abdomen is soft, non-distended, no percussion tenderness, mild epigastric tenderness to palpation, negative Murphy's; no rigidity, guarding, rebound tenderness  Skin:    General: Skin is warm and dry.  Neurological:     General: No focal deficit present.     Mental Status: He is alert  and oriented to person, place, and time.  Psychiatric:        Mood and Affect: Mood normal.        Behavior: Behavior normal.    Results: Results for orders placed or performed during the hospital encounter of 04/23/21 (from the past 48 hour(s))  Lipase, blood     Status: Abnormal   Collection Time: 04/23/21  1:01 AM  Result Value Ref Range   Lipase 384 (H) 11 - 51 U/L    Comment: Performed at [facility], [address], [place], [place] [address]  Comprehensive metabolic panel     Status: Abnormal    Collection Time: 04/23/21  1:01 AM  Result Value Ref Range   Sodium 139 135 - 145 mmol/L   Potassium 3.7 3.5 - 5.1 mmol/L   Chloride 102 98 - 111 mmol/L   CO2 30 22 - 32 mmol/L   Glucose, Bld 109 (H) 70 - 99 mg/dL    Comment: Glucose reference range applies only to samples taken after fasting for at least 8 hours.   BUN 24 (H) 6 - 20 mg/dL   Creatinine, Ser 1.02 0.61 - 1.24 mg/dL   Calcium 9.0 8.9 - 10.3 mg/dL   Total Protein 7.4 6.5 - 8.1 g/dL   Albumin 4.1 3.5 - 5.0 g/dL   AST 95 (H) 15 - 41 U/L   ALT 72 (H) 0 - 44 U/L   Alkaline Phosphatase 63 38 - 126 U/L   Total Bilirubin 1.0 0.3 - 1.2 mg/dL   GFR, Estimated >60 >60 mL/min    Comment: (NOTE) Calculated using the CKD-EPI Creatinine Equation (2021)    Anion gap 7 5 - 15    Comment: Performed at [facility], [address] [place], [place] [address]  CBC     Status: None   Collection Time: 04/23/21  1:01 AM  Result Value Ref Range   WBC 6.2 4.0 - 10.5 K/uL   RBC 4.65 4.22 - 5.81 MIL/uL   Hemoglobin 14.1 13.0 - 17.0 g/dL   HCT 41.0 39.0 - 52.0 %   MCV 88.2 80.0 - 100.0 fL   MCH 30.3 26.0 - 34.0 pg   MCHC 34.4 30.0 - 36.0 g/dL   RDW 12.4 11.5 - 15.5 %   Platelets 175 150 - 400 K/uL   nRBC 0.0 0.0 - 0.2 %    Comment: Performed at [facility], [address] [place], [place] [address]  Troponin I (High Sensitivity)     Status: None   Collection Time: 04/23/21  1:01 AM  Result Value Ref Range   Troponin I (High Sensitivity) 3 <18 ng/L    Comment: (NOTE) Elevated high sensitivity troponin I (hsTnI) values and significant  changes across serial measurements may suggest ACS but many other  chronic and acute conditions are known to elevate hsTnI results.  Refer to the "Links" section for chest pain algorithms and additional  guidance. Performed at [facility], [address], [place], [place] [address]   Urinalysis, Routine w reflex microscopic Urine, Clean Catch     Status: Abnormal   Collection Time: 04/23/21  1:58  AM  Result Value Ref Range   Color, Urine YELLOW YELLOW   APPearance HAZY (A) CLEAR   Specific Gravity, Urine 1.021 1.005 - 1.030   pH 7.0 5.0 - 8.0   Glucose, UA NEGATIVE NEGATIVE mg/dL   Hgb urine dipstick NEGATIVE NEGATIVE   Bilirubin Urine NEGATIVE NEGATIVE   Ketones, ur NEGATIVE NEGATIVE mg/dL  Protein, ur 30 (A) NEGATIVE mg/dL   Nitrite NEGATIVE NEGATIVE   Leukocytes,Ua NEGATIVE NEGATIVE   RBC / HPF 0-5 0 - 5 RBC/hpf   WBC, UA 0-5 0 - 5 WBC/hpf   Bacteria, UA NONE SEEN NONE SEEN   Mucus PRESENT    Budding Yeast PRESENT     Comment: Performed at [facility], [address] [place], [place] [address]  Troponin I (High Sensitivity)     Status: None   Collection Time: 04/23/21  3:25 AM  Result Value Ref Range   Troponin I (High Sensitivity) 3 <18 ng/L    Comment: (NOTE) Elevated high sensitivity troponin I (hsTnI) values and significant  changes across serial measurements may suggest ACS but many other  chronic and acute conditions are known to elevate hsTnI results.  Refer to the "Links" section for chest pain algorithms and additional  guidance. Performed at [facility], [address] [place], [place] [address]   Resp Panel by RT-PCR (Flu A&B, Covid) Nasopharyngeal Swab     Status: None   Collection Time: 04/23/21  3:25 AM   Specimen: Nasopharyngeal Swab; Nasopharyngeal(NP) swabs in vial transport medium  Result Value Ref Range   SARS Coronavirus 2 by RT PCR NEGATIVE NEGATIVE    Comment: (NOTE) SARS-CoV-2 target nucleic acids are NOT DETECTED.  The SARS-CoV-2 RNA is generally detectable in upper respiratory specimens during the acute phase of infection. The lowest concentration of SARS-CoV-2 viral copies this assay can detect is 138 copies/mL. A negative result does not preclude SARS-Cov-2 infection and should not be used as the sole basis for treatment or other patient management decisions. A negative result may occur with  improper specimen collection/handling,  submission of specimen other than nasopharyngeal swab, presence of viral mutation(s) within the areas targeted by this assay, and inadequate number of viral copies(<138 copies/mL). A negative result must be combined with clinical observations, patient history, and epidemiological information. The expected result is Negative.  Fact Sheet for Patients:  EntrepreneurPulse.com.au  Fact Sheet for Healthcare Providers:  IncredibleEmployment.be  This test is no t yet approved or cleared by the [place] FDA and  has been authorized for detection and/or diagnosis of SARS-CoV-2 by FDA under an Emergency Use Authorization (EUA). This EUA will remain  in effect (meaning this test can be used) for the duration of the COVID-19 declaration under Section 564(b)(1) of the Act, 21 U.S.C.section 360bbb-3(b)(1), unless the authorization is terminated  or revoked sooner.       Influenza A by PCR NEGATIVE NEGATIVE   Influenza B by PCR NEGATIVE NEGATIVE    Comment: (NOTE) The Xpert Xpress SARS-CoV-2/FLU/RSV plus assay is intended as an aid in the diagnosis of influenza from Nasopharyngeal swab specimens and should not be used as a sole basis for treatment. Nasal washings and aspirates are unacceptable for Xpert Xpress SARS-CoV-2/FLU/RSV testing.  Fact Sheet for Patients: EntrepreneurPulse.com.au  Fact Sheet for Healthcare Providers: IncredibleEmployment.be  This test is not yet approved or cleared by the [place] FDA and has been authorized for detection and/or diagnosis of SARS-CoV-2 by FDA under an Emergency Use Authorization (EUA). This EUA will remain in effect (meaning this test can be used) for the duration of the COVID-19 declaration under Section 564(b)(1) of the Act, 21 U.S.C. section 360bbb-3(b)(1), unless the authorization is terminated or revoked.  Performed at [facility], [address],  [place], [place] [address]   Lipid panel     Status: Abnormal   Collection Time: 04/23/21  3:25 AM  Result Value Ref Range   Cholesterol 261 (H) 0 - 200 mg/dL   Triglycerides 141 <150 mg/dL   HDL 40 (L) >40 mg/dL   Total CHOL/HDL Ratio 6.5 RATIO   VLDL 28 0 - 40 mg/dL   LDL Cholesterol 193 (H) 0 - 99 mg/dL    Comment:        Total Cholesterol/HDL:CHD Risk Coronary Heart Disease Risk Table                     Men   Women  1/2 Average Risk   3.4   3.3  Average Risk       5.0   4.4  2 X Average Risk   9.6   7.1  3 X Average Risk  23.4   11.0        Use the calculated Patient Ratio above and the CHD Risk Table to determine the patient's CHD Risk.        ATP III CLASSIFICATION (LDL):  <100     mg/dL   Optimal  100-129  mg/dL   Near or Above                    Optimal  130-159  mg/dL   Borderline  160-189  mg/dL   High  >190     mg/dL   Very High Performed at [facility], [address], [place], [place] [address]   HIV Antibody (routine testing w rflx)     Status: None   Collection Time: 04/23/21  4:50 AM  Result Value Ref Range   HIV Screen 4th Generation wRfx Non Reactive Non Reactive    Comment: Performed at [facility], [place] [address], [place], [place] [address]  APTT     Status: None   Collection Time: 04/23/21  4:50 AM  Result Value Ref Range   aPTT 25 24 - 36 seconds    Comment: Performed at [facility], [address], [place], [place] [address]  Magnesium     Status: None   Collection Time: 04/23/21  4:50 AM  Result Value Ref Range   Magnesium 2.1 1.7 - 2.4 mg/dL    Comment: Performed at [facility], [address] [place], [place] [address]  Phosphorus     Status: None   Collection Time: 04/23/21  4:50 AM  Result Value Ref Range   Phosphorus 2.8 2.5 - 4.6 mg/dL    Comment: Performed at [facility], [address], [place], [place] [address]  Glucose, capillary     Status: None   Collection Time: 04/23/21 12:07 PM  Result Value Ref Range    Glucose-Capillary 81 70 - 99 mg/dL    Comment: Glucose reference range applies only to samples taken after fasting for at least 8 hours.  Glucose, capillary     Status: Abnormal   Collection Time: 04/23/21  4:49 PM  Result Value Ref Range   Glucose-Capillary 66 (L) 70 - 99 mg/dL    Comment: Glucose reference range applies only to samples taken after fasting for at least 8 hours.  Glucose, capillary     Status: Abnormal   Collection Time: 04/23/21  5:30 PM  Result Value Ref Range   Glucose-Capillary 107 (H) 70 - 99 mg/dL    Comment: Glucose reference range applies only to samples taken after fasting for at least 8 hours.  Glucose, capillary     Status: None   Collection Time:  04/24/21 12:01 AM  Result Value Ref Range   Glucose-Capillary 89 70 - 99 mg/dL    Comment: Glucose reference range applies only to samples taken after fasting for at least 8 hours.  Glucose, capillary     Status: Abnormal   Collection Time: 04/24/21  5:55 AM  Result Value Ref Range   Glucose-Capillary 119 (H) 70 - 99 mg/dL    Comment: Glucose reference range applies only to samples taken after fasting for at least 8 hours.  Comprehensive metabolic panel     Status: Abnormal   Collection Time: 04/24/21  5:59 AM  Result Value Ref Range   Sodium 136 135 - 145 mmol/L   Potassium 4.0 3.5 - 5.1 mmol/L   Chloride 101 98 - 111 mmol/L   CO2 27 22 - 32 mmol/L   Glucose, Bld 128 (H) 70 - 99 mg/dL    Comment: Glucose reference range applies only to samples taken after fasting for at least 8 hours.   BUN 18 6 - 20 mg/dL   Creatinine, Ser 0.86 0.61 - 1.24 mg/dL   Calcium 9.0 8.9 - 10.3 mg/dL   Total Protein 7.3 6.5 - 8.1 g/dL   Albumin 3.9 3.5 - 5.0 g/dL   AST 169 (H) 15 - 41 U/L   ALT 243 (H) 0 - 44 U/L   Alkaline Phosphatase 91 38 - 126 U/L   Total Bilirubin 6.4 (H) 0.3 - 1.2 mg/dL    Comment: DELTA CHECK NOTED   GFR, Estimated >60 >60 mL/min    Comment: (NOTE) Calculated using the CKD-EPI Creatinine Equation  (2021)    Anion gap 8 5 - 15    Comment: Performed at [facility], [address] [place], [place] [address]  CBC     Status: None   Collection Time: 04/24/21  5:59 AM  Result Value Ref Range   WBC 8.5 4.0 - 10.5 K/uL   RBC 4.55 4.22 - 5.81 MIL/uL   Hemoglobin 13.8 13.0 - 17.0 g/dL   HCT 40.3 39.0 - 52.0 %   MCV 88.6 80.0 - 100.0 fL   MCH 30.3 26.0 - 34.0 pg   MCHC 34.2 30.0 - 36.0 g/dL   RDW 12.1 11.5 - 15.5 %   Platelets 161 150 - 400 K/uL   nRBC 0.0 0.0 - 0.2 %    Comment: Performed at [facility], [address] [place], [place] [address]  Triglycerides     Status: None   Collection Time: 04/24/21  5:59 AM  Result Value Ref Range   Triglycerides 91 <150 mg/dL    Comment: Performed at [facility], [address], [place], [place] [address]  Lipase, blood     Status: Abnormal   Collection Time: 04/24/21  5:59 AM  Result Value Ref Range   Lipase 1,102 (H) 11 - 51 U/L    Comment: RESULTS CONFIRMED BY MANUAL DILUTION Performed at [facility], [address] [place],  [place] [address]   Hepatic function panel     Status: Abnormal   Collection Time: 04/24/21  5:59 AM  Result Value Ref Range   Total Protein 7.2 6.5 - 8.1 g/dL   Albumin 3.9 3.5 - 5.0 g/dL   AST 173 (H) 15 - 41 U/L   ALT 241 (H) 0 - 44 U/L   Alkaline Phosphatase 91 38 - 126 U/L   Total Bilirubin 6.4 (H) 0.3 - 1.2 mg/dL   Bilirubin, Direct 3.4 (H) 0.0 - 0.2 mg/dL   Indirect Bilirubin  3.0 (H) 0.3 - 0.9 mg/dL    Comment: Performed at [facility], [address] [place], [place] [address]  Glucose, capillary     Status: Abnormal   Collection Time: 04/24/21 11:31 AM  Result Value Ref Range   Glucose-Capillary 132 (H) 70 - 99 mg/dL    Comment: Glucose reference range applies only to samples taken after fasting for at least 8 hours.    CT ABDOMEN PELVIS W CONTRAST  Result Date: 04/23/2021 CLINICAL DATA:  Acute pancreatitis. EXAM: CT ABDOMEN AND PELVIS WITH CONTRAST TECHNIQUE: Multidetector CT imaging of the  abdomen and pelvis was performed using the standard protocol following bolus administration of intravenous contrast. RADIATION DOSE REDUCTION: This exam was performed according to the departmental dose-optimization program which includes automated exposure control, adjustment of the mA and/or kV according to patient size and/or use of iterative reconstruction technique. CONTRAST:  163mL OMNIPAQUE IOHEXOL 300 MG/ML  SOLN COMPARISON:  CT abdomen pelvis dated 12/16/2019. FINDINGS: Lower chest: The visualized lung bases are clear. No intra-abdominal free air or free fluid. Hepatobiliary: Mild fatty liver. No intrahepatic biliary dilatation. The gallbladder is unremarkable. Pancreas: Unremarkable. No pancreatic ductal dilatation or surrounding inflammatory changes. Spleen: Normal in size without focal abnormality. Adrenals/Urinary Tract: The adrenal glands unremarkable. The kidneys, visualized ureters, and urinary bladder appear unremarkable. Stomach/Bowel: There is sigmoid diverticulosis and scattered colonic diverticula without active inflammatory changes. There is no bowel obstruction or active inflammation. The appendix is normal. Vascular/Lymphatic: The abdominal aorta and IVC unremarkable. No portal venous gas. There is no adenopathy. Reproductive: The prostate and seminal vesicles are grossly unremarkable. No pelvic mass. Other: None Musculoskeletal: No acute or significant osseous findings. IMPRESSION: 1. No acute intra-abdominal or pelvic pathology. 2. Colonic diverticulosis. No bowel obstruction. Normal appendix. 3. Mild fatty liver. Electronically Signed   By: Anner Crete M.D.   On: 04/23/2021 02:56   NM Hepato W/EF  Result Date: 04/24/2021 CLINICAL DATA:  Acute abdominal pain for 1 day, abnormal ultrasound demonstrating markedly thickened gallbladder wall, borderline CBD dilatation, and gallbladder sludge; increasing LFTs and pain EXAM: NUCLEAR MEDICINE HEPATOBILIARY IMAGING TECHNIQUE: Sequential  images of the abdomen were obtained out to 60 minutes following intravenous administration of radiopharmaceutical. RADIOPHARMACEUTICALS:  5.5 mCi Tc-33m  Choletec IV COMPARISON:  CT abdomen and pelvis 04/23/2021, ultrasound abdomen 04/23/2021 FINDINGS: Adequate clearance of tracer from bloodstream indicating normal hepatocellular function. Markedly delayed excretion of tracer into the biliary tree. At 1 hour, only a small amount of tracer within proximal CBD is noted. Gallbladder had not visualized. Patient received morphine following which the gallbladder visualized indicating patency of the cystic duct. In combination with the thickened gallbladder wall identified on ultrasound, this could represent chronic cholecystitis. Delayed image performed at 18 hours demonstrates marked persistent retention of tracer within the liver as well as tracer within the gallbladder. Only faint tracer is seen within bowel. Findings are consistent with high-grade CBD obstruction. IMPRESSION: Patent cystic duct. Only faint tracer is seen within the bowel at 18 hours, with most of tracer retained within hepatic parenchyma and gallbladder, consistent with a high-grade common bile duct obstruction. Consider MRCP imaging and/or ERCP. Electronically Signed   By: Lavonia Dana M.D.   On: 04/24/2021 09:10   US Abdomen Limited  Addendum Date: 04/23/2021   ADDENDUM REPORT: 04/23/2021 12:07 ADDENDUM: The common bile duct measures 6.5 mm which is borderline. The patient's bilirubin was within normal limits today. Recommend clinical correlation. Electronically Signed   By: Dorise Bullion III M.D.  On: 04/23/2021 12:07   Result Date: 04/23/2021 CLINICAL DATA:  Abdominal pain with nausea and vomiting. EXAM: ULTRASOUND ABDOMEN LIMITED RIGHT UPPER QUADRANT COMPARISON:  None. FINDINGS: Gallbladder: Sludge fills most of the gallbladder. Gallbladder wall thickening of 5 mm identified. There is a small amount pericholecystic fluid is well.  Gallbladder wall may be edematous. No Murphy's sign. No stones identified. Common bile duct: Diameter: 6.5 mm Liver: Increased heterogeneous echotexture. No focal mass. Portal vein is patent on color Doppler imaging with normal direction of blood flow towards the liver. Other: None. IMPRESSION: 1. The gallbladder is nearly filled with sludge with gallbladder wall thickening. The gallbladder wall also appears somewhat edematous and there is a small amount of pericholecystic fluid. No stones or Murphy's sign. Acute cholecystitis not excluded. If the clinical picture remains ambiguous, recommend a HIDA scan. 2. Probable hepatic steatosis. Electronically Signed: By: Dorise Bullion III M.D. On: 04/23/2021 10:55     Assessment & Plan:  AYINDE SWIM is a 46 y.o. male who was admitted with a 3-day history of abdominal pain with concern for pancreatitis.  Abdominal ultrasound demonstrating sludge with mild pericholecystic fluid and wall thickening but negative Murphy's; HIDA demonstrates patent cystic duct but concern for CBD obstruction. Lipase 1102, T. bili 6.4, D bili 3.4, AST 173, ALT 241, ALP 91, WBC 8.5  -Patient's laboratory findings and imaging evaluated by myself.  Patient likely has a gallstone pancreatitis with concerns for CBD obstruction on HIDA -Patient will need cholecystectomy in the future, however timing to be determined by when ERCP performed and patient's clinical progression given concern for active pancreatitis -IVF -NPO -No need for antibiotics from general surgery standpoint -Appreciate GI recommendations regarding timing of ERCP -Care per primary team  All questions were answered to the satisfaction of the patient and family.  -- Graciella Freer, DO [facility] [address] [place], [place] [address] [phone] (office)

## 2021-04-24 NOTE — Anesthesia Preprocedure Evaluation (Addendum)
Anesthesia Evaluation  Patient identified by MRN, date of birth, ID band Patient awake    Reviewed: Allergy & Precautions, NPO status , Patient's Chart, lab work & pertinent test results  History of Anesthesia Complications Negative for: history of anesthetic complications  Airway Mallampati: III  TM Distance: >3 FB Neck ROM: Full  Mouth opening: Limited Mouth Opening  Dental  (+) Dental Advisory Given, Teeth Intact   Pulmonary sleep apnea (snoring?) ,    Pulmonary exam normal breath sounds clear to auscultation       Cardiovascular negative cardio ROS Normal cardiovascular exam Rhythm:Regular Rate:Normal     Neuro/Psych PSYCHIATRIC DISORDERS Anxiety Depression Bipolar Disorder negative neurological ROS     GI/Hepatic negative GI ROS, CBD stones   Endo/Other  negative endocrine ROS  Renal/GU Renal disease  negative genitourinary   Musculoskeletal negative musculoskeletal ROS (+)   Abdominal   Peds negative pediatric ROS (+)  Hematology negative hematology ROS (+)   Anesthesia Other Findings   Reproductive/Obstetrics negative OB ROS                           Anesthesia Physical Anesthesia Plan  ASA: 2  Anesthesia Plan: General   Post-op Pain Management: Dilaudid IV   Induction: Intravenous  PONV Risk Score and Plan: 3 and Ondansetron and Dexamethasone  Airway Management Planned: Oral ETT  Additional Equipment:   Intra-op Plan:   Post-operative Plan: Extubation in OR  Informed Consent: I have reviewed the patients History and Physical, chart, labs and discussed the procedure including the risks, benefits and alternatives for the proposed anesthesia with the patient or authorized representative who has indicated his/her understanding and acceptance.       Plan Discussed with: CRNA  Anesthesia Plan Comments:         Anesthesia Quick Evaluation

## 2021-04-24 NOTE — Progress Notes (Signed)
Brief EGD note.   Mild changes of esophagitis at GE junction. Small diverticulum proximal to GE junction.  Therefore esophageal dilation not attempted. Single patch of salmon-colored mucosa with typical features of Barrett's esophagus.  Will plan biopsy in 3 months after therapy with PPI. Erosive antral gastritis. Duodenal could not be examined with regular scope.    ERCP note. 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 CBD cannulated with Rx 44 autotome with 035 guidewire. Resistance noted as catheter tip advanced into the bile duct. Cholangiogram reveals mildly dilated bile duct without filling defects. Biliary sphincterotomy performed with removal of sludge which was felt to sludge without stone. Pancreatic duct was not cannulated.  Small MRI with contrast failed distal duct.  Patient tolerated procedure well.

## 2021-04-24 NOTE — Progress Notes (Signed)
Subjective: Worsening abdominal pain overnight, currently 10/10 in severity. Pain is all over, but worse in the epigastric area and radiates to his back. If he turns on his right side, pain worsens in RUQ, if turns on the left side, pain worsens in LUQ. Toradol only helping for a couple of hours. Has had N/V all night. No hematemesis or coffee ground emesis. No BM.   Objective: Vital signs in last 24 hours: Temp:  [97.5 F (36.4 C)-98.7 F (37.1 C)] 97.7 F (36.5 C) (02/28 0529) Pulse Rate:  [60-77] 70 (02/28 0529) Resp:  [18-20] 19 (02/28 0529) BP: (100-137)/(74-88) 132/88 (02/28 0529) SpO2:  [97 %-100 %] 98 % (02/28 0529) Last BM Date :  (pta) General:   Alert and oriented, in distress related to abdominal pain.  Head:  Normocephalic and atraumatic. Eyes:  Slight scleral icterus.  Abdomen:  Bowel sounds present, abdomen is soft, non-distended, with generalized TTP, severe TTP in epigastric area, moderate TTP in RUQ and LUQ to light palpation. No rebound or guarding. No masses appreciated  Msk:  Symmetrical without gross deformities. Normal posture. Extremities:  Without edema. Neurologic:  Alert and  oriented x4;  grossly normal neurologically. Skin:  Warm and dry, intact without significant lesions.  Psych: Normal mood and affect.  Intake/Output from previous day: 02/27 0701 - 02/28 0700 In: 840 [P.O.:840] Out: 150 [Urine:150] Intake/Output this shift: No intake/output data recorded.  Lab Results: Recent Labs    04/23/21 0101 04/24/21 0559  WBC 6.2 8.5  HGB 14.1 13.8  HCT 41.0 40.3  PLT 175 161   BMET Recent Labs    04/23/21 0101 04/24/21 0559  NA 139 136  K 3.7 4.0  CL 102 101  CO2 30 27  GLUCOSE 109* 128*  BUN 24* 18  CREATININE 1.02 0.86  CALCIUM 9.0 9.0   LFT Recent Labs    04/23/21 0101 04/24/21 0559  PROT 7.4 7.3  ALBUMIN 4.1 3.9  AST 95* 169*  ALT 72* 243*  ALKPHOS 63 91  BILITOT 1.0 6.4*    Studies/Results: CT ABDOMEN PELVIS W  CONTRAST  Result Date: 04/23/2021 CLINICAL DATA:  Acute pancreatitis. EXAM: CT ABDOMEN AND PELVIS WITH CONTRAST TECHNIQUE: Multidetector CT imaging of the abdomen and pelvis was performed using the standard protocol following bolus administration of intravenous contrast. RADIATION DOSE REDUCTION: This exam was performed according to the departmental dose-optimization program which includes automated exposure control, adjustment of the mA and/or kV according to patient size and/or use of iterative reconstruction technique. CONTRAST:  133mL OMNIPAQUE IOHEXOL 300 MG/ML  SOLN COMPARISON:  CT abdomen pelvis dated 12/16/2019. FINDINGS: Lower chest: The visualized lung bases are clear. No intra-abdominal free air or free fluid. Hepatobiliary: Mild fatty liver. No intrahepatic biliary dilatation. The gallbladder is unremarkable. Pancreas: Unremarkable. No pancreatic ductal dilatation or surrounding inflammatory changes. Spleen: Normal in size without focal abnormality. Adrenals/Urinary Tract: The adrenal glands unremarkable. The kidneys, visualized ureters, and urinary bladder appear unremarkable. Stomach/Bowel: There is sigmoid diverticulosis and scattered colonic diverticula without active inflammatory changes. There is no bowel obstruction or active inflammation. The appendix is normal. Vascular/Lymphatic: The abdominal aorta and IVC unremarkable. No portal venous gas. There is no adenopathy. Reproductive: The prostate and seminal vesicles are grossly unremarkable. No pelvic mass. Other: None Musculoskeletal: No acute or significant osseous findings. IMPRESSION: 1. No acute intra-abdominal or pelvic pathology. 2. Colonic diverticulosis. No bowel obstruction. Normal appendix. 3. Mild fatty liver. Electronically Signed   By: Laren Everts.D.  On: 04/23/2021 02:56   US Abdomen Limited  Addendum Date: 04/23/2021   ADDENDUM REPORT: 04/23/2021 12:07 ADDENDUM: The common bile duct measures 6.5 mm which is  borderline. The patient's bilirubin was within normal limits today. Recommend clinical correlation. Electronically Signed   By: Dorise Bullion III M.D.   On: 04/23/2021 12:07   Result Date: 04/23/2021 CLINICAL DATA:  Abdominal pain with nausea and vomiting. EXAM: ULTRASOUND ABDOMEN LIMITED RIGHT UPPER QUADRANT COMPARISON:  None. FINDINGS: Gallbladder: Sludge fills most of the gallbladder. Gallbladder wall thickening of 5 mm identified. There is a small amount pericholecystic fluid is well. Gallbladder wall may be edematous. No Murphy's sign. No stones identified. Common bile duct: Diameter: 6.5 mm Liver: Increased heterogeneous echotexture. No focal mass. Portal vein is patent on color Doppler imaging with normal direction of blood flow towards the liver. Other: None. IMPRESSION: 1. The gallbladder is nearly filled with sludge with gallbladder wall thickening. The gallbladder wall also appears somewhat edematous and there is a small amount of pericholecystic fluid. No stones or Murphy's sign. Acute cholecystitis not excluded. If the clinical picture remains ambiguous, recommend a HIDA scan. 2. Probable hepatic steatosis. Electronically Signed: By: Dorise Bullion III M.D. On: 04/23/2021 10:55    Assessment: DAMAIN BROADUS is a 46 y.o. year old male a history of Anxiety, Depression, and kidney stones who presented with acute onset epigastric abdominal pain, nausea, vomiting, found to have elevated LFTs, elevated lipase, CT without pancreatitis, RUQ [place] with gallbladder wall thickening, mostly filled with sludge, and small amount of pericholecystic fluid, CBD borderline at 6.5 mm. HIDA yesterday without final read this morning, but per Dr. Denton Brick, there were concerns that dilaudid had affected the test and patient had repeat HIDA this morning to re-evaluate revealing patent cystic duct, concern for high-grade common bile duct obstruction. He has had worsening LFTs and bilirubin this morning, consistent with  CBD obstruction,  AST 169, ALT 243, bilirubin 6.4, up from 1.0 yesterday. WBC remains normal and he is afebrile. Clinically, he has had worsening abdominal pain overnight along with nausea and vomiting.   Suspect we are dealing with biliary pancreatitis. Though CT didn't demonstrate pancreatitis, presentation may have been early. Spoke with Dr. Laural Golden. Will recheck Lipase and plan for likely ERCP around lunch time pending Lipase.   Dysphagia:  intermittent episodes for months with sensation that food/meds are stuck in his throat. Occasional reflux. Differentials include esophageal web, ring, stricture.  Would benefit from EGD +/- dilation for further evaluation. Timing to be determined. Possible that this could be added on to ERCP.   Plan: Lipase STAT Keep NPO. Possible ERCP with Dr. Laural Golden today pending repeat lipase. Continue IV fluids.   Continue antiemetics and analgesics prn.     LOS: 1 day    04/24/2021, 7:33 AM   Aliene Altes, PA-C [facility]

## 2021-04-24 NOTE — Transfer of Care (Signed)
Immediate Anesthesia Transfer of Care Note  Patient: NOLYN SWAB  Procedure(s) Performed: ENDOSCOPIC RETROGRADE CHOLANGIOPANCREATOGRAPHY (ERCP) ESOPHAGEAL DILATION ESOPHAGOGASTRODUODENOSCOPY (EGD) WITH PROPOFOL  Patient Location: PACU  Anesthesia Type:General  Level of Consciousness: awake, sedated and drowsy  Airway & Oxygen Therapy: Patient Spontanous Breathing and Patient connected to face mask oxygen  Post-op Assessment: Report given to RN and Post -op Vital signs reviewed and stable  Post vital signs: Reviewed and stable  Last Vitals:  Vitals Value Taken Time  BP 138/72 04/24/21 1930  Temp 36.4 C 04/24/21 1930  Pulse 84 04/24/21 1937  Resp 19 04/24/21 1937  SpO2 100 % 04/24/21 1937  Vitals shown include unvalidated device data.  Last Pain:  Vitals:   04/24/21 1930  TempSrc:   PainSc: Asleep      Patients Stated Pain Goal: 2 (02/40/97 3532)  Complications: No notable events documented.

## 2021-04-24 NOTE — Anesthesia Postprocedure Evaluation (Signed)
Anesthesia Post Note  Patient: BIRD SWETZ  Procedure(s) Performed: ENDOSCOPIC RETROGRADE CHOLANGIOPANCREATOGRAPHY (ERCP) ESOPHAGEAL DILATION ESOPHAGOGASTRODUODENOSCOPY (EGD) WITH PROPOFOL  Patient location during evaluation: PACU Anesthesia Type: General Level of consciousness: awake and alert, oriented and sedated Pain management: pain level controlled Vital Signs Assessment: post-procedure vital signs reviewed and stable Respiratory status: spontaneous breathing, nonlabored ventilation and respiratory function stable Cardiovascular status: blood pressure returned to baseline and stable Postop Assessment: no apparent nausea or vomiting Anesthetic complications: no   No notable events documented.   Last Vitals:  Vitals:   04/24/21 1930 04/24/21 1945  BP: 138/72 (!) 137/101  Pulse:    Resp: 18 17  Temp: (!) 36.4 C   SpO2: 95% 90%    Last Pain:  Vitals:   04/24/21 1945  TempSrc:   PainSc: 5                  Shawnta Schlegel C Ashely Joshua

## 2021-04-25 DIAGNOSIS — K8020 Calculus of gallbladder without cholecystitis without obstruction: Secondary | ICD-10-CM

## 2021-04-25 DIAGNOSIS — R1310 Dysphagia, unspecified: Secondary | ICD-10-CM

## 2021-04-25 DIAGNOSIS — R1084 Generalized abdominal pain: Secondary | ICD-10-CM

## 2021-04-25 DIAGNOSIS — K859 Acute pancreatitis without necrosis or infection, unspecified: Secondary | ICD-10-CM

## 2021-04-25 DIAGNOSIS — R748 Abnormal levels of other serum enzymes: Secondary | ICD-10-CM

## 2021-04-25 DIAGNOSIS — Q396 Congenital diverticulum of esophagus: Secondary | ICD-10-CM

## 2021-04-25 DIAGNOSIS — K8001 Calculus of gallbladder with acute cholecystitis with obstruction: Secondary | ICD-10-CM

## 2021-04-25 DIAGNOSIS — K3189 Other diseases of stomach and duodenum: Secondary | ICD-10-CM

## 2021-04-25 DIAGNOSIS — K2289 Other specified disease of esophagus: Secondary | ICD-10-CM

## 2021-04-25 DIAGNOSIS — R1012 Left upper quadrant pain: Secondary | ICD-10-CM

## 2021-04-25 DIAGNOSIS — K2100 Gastro-esophageal reflux disease with esophagitis, without bleeding: Secondary | ICD-10-CM

## 2021-04-25 DIAGNOSIS — K851 Biliary acute pancreatitis without necrosis or infection: Principal | ICD-10-CM

## 2021-04-25 LAB — COMPREHENSIVE METABOLIC PANEL
ALT: 277 U/L — ABNORMAL HIGH (ref 0–44)
AST: 150 U/L — ABNORMAL HIGH (ref 15–41)
Albumin: 3.6 g/dL (ref 3.5–5.0)
Alkaline Phosphatase: 129 U/L — ABNORMAL HIGH (ref 38–126)
Anion gap: 8 (ref 5–15)
BUN: 17 mg/dL (ref 6–20)
CO2: 26 mmol/L (ref 22–32)
Calcium: 8.6 mg/dL — ABNORMAL LOW (ref 8.9–10.3)
Chloride: 102 mmol/L (ref 98–111)
Creatinine, Ser: 0.91 mg/dL (ref 0.61–1.24)
GFR, Estimated: >60 mL/min (ref >60)
Glucose, Bld: 147 mg/dL — ABNORMAL HIGH (ref 70–99)
Potassium: 4.3 mmol/L (ref 3.5–5.1)
Sodium: 136 mmol/L (ref 135–145)
Total Bilirubin: 2.8 mg/dL — ABNORMAL HIGH (ref 0.3–1.2)
Total Protein: 7.1 g/dL (ref 6.5–8.1)

## 2021-04-25 LAB — CBC
HCT: 40.4 % (ref 39.0–52.0)
Hemoglobin: 13.7 g/dL (ref 13.0–17.0)
MCH: 30.5 pg (ref 26.0–34.0)
MCHC: 33.9 g/dL (ref 30.0–36.0)
MCV: 90 fL (ref 80.0–100.0)
Platelets: 154 10*3/uL (ref 150–400)
RBC: 4.49 MIL/uL (ref 4.22–5.81)
RDW: 12.5 % (ref 11.5–15.5)
WBC: 8.9 10*3/uL (ref 4.0–10.5)
nRBC: 0 % (ref 0.0–0.2)

## 2021-04-25 LAB — LIPASE, BLOOD: Lipase: 195 U/L — ABNORMAL HIGH (ref 11–51)

## 2021-04-25 LAB — SURGICAL PCR SCREEN
MRSA, PCR: NEGATIVE
Staphylococcus aureus: NEGATIVE

## 2021-04-25 LAB — GLUCOSE, CAPILLARY
Glucose-Capillary: 137 mg/dL — ABNORMAL HIGH (ref 70–99)
Glucose-Capillary: 110 mg/dL — ABNORMAL HIGH (ref 70–99)

## 2021-04-25 MED ORDER — SENNOSIDES-DOCUSATE SODIUM 8.6-50 MG PO TABS
2.0000 | ORAL_TABLET | Freq: Two times a day (BID) | ORAL | Status: AC
  Administered 2021-04-25 (×2): 2 via ORAL
  Filled 2021-04-25 (×2): qty 2

## 2021-04-25 NOTE — Op Note (Signed)
[facility] Patient Name: Dale Davis Procedure Date: 04/24/2021 6:01 PM MRN: [id number] Date of Birth: [birth year] Attending MD: Hildred Laser , MD CSN: [id number] Age: 46 Admit Type: Inpatient Procedure:                Upper GI endoscopy Indications:              Dysphagia Providers:                Hildred Laser, MD, Lurline Del, RN, Casimer Bilis, Technician Referring MD:             Roxan Hockey, MD Medicines:                General Anesthesia Complications:            No immediate complications. Estimated Blood Loss:     Estimated blood loss: none. Procedure:                Pre-Anesthesia Assessment:                           - Prior to the procedure, a History and Physical                            was performed, and patient medications and                            allergies were reviewed. The patient's tolerance of                            previous anesthesia was also reviewed. The risks                            and benefits of the procedure and the sedation                            options and risks were discussed with the patient.                            All questions were answered, and informed consent                            was obtained. Prior Anticoagulants: The patient                            last took Lovenox (enoxaparin) 1 day prior to the                            procedure. ASA Grade Assessment: II - A patient                            with mild systemic disease. After reviewing the  risks and benefits, the patient was deemed in                            satisfactory condition to undergo the procedure.                           After obtaining informed consent, the endoscope was                            passed under direct vision. Throughout the                            procedure, the patient's blood pressure, pulse, and                            oxygen saturations were  monitored continuously. The                            [id number]-H190 ([id number]) scope was introduced through the                            mouth, and advanced to the pylorus. The upper GI                            endoscopy was accomplished without difficulty. The                            patient tolerated the procedure well. Scope In: 6:47:11 PM Scope Out: 6:53:18 PM Total Procedure Duration: 0 hours 6 minutes 7 seconds  Findings:      A non-bleeding diverticulum with a small opening and no stigmata of       recent bleeding was found in the distal esophagus.      There were esophageal mucosal changes suspicious for short-segment       Barrett's esophagus present in the distal esophagus. The maximum       longitudinal extent of these mucosal changes was 1 cm in length.      A few erosions with no stigmata of recent bleeding were found in the       prepyloric region of the stomach.      The exam of the stomach was otherwise normal.      An examination of the duodenum was not performed. Impression:               - Diverticulum in the distal esophagus.                           - Esophageal mucosal changes suspicious for                            short-segment Barrett's esophagus.                           - Erosive gastropathy with no stigmata of recent  bleeding.                           - No specimens collected.                           : Esophageal dilation not attempted with finding of                            esophageal diverticulum.                           - Esophagitis with no bleeding. [All Maneuvers]. Moderate Sedation:      Per Anesthesia Care Recommendation:           - Return patient to hospital ward for ongoing care.                           - NPO.                           - Continue present medications.                           - Repeat EGD with biopsy after therapy with PPI for                            3 months.                            - H. pylori serology                           - See the other procedure note for documentation of                            additional recommendations. Procedure Code(s):        --- Professional ---                           [phone], Dysphagia screening Diagnosis Code(s):        --- Professional ---                           K21.00, Gastro-esophageal reflux disease with                            esophagitis, without bleeding                           Q39.6, Congenital diverticulum of esophagus                           K22.8, Other specified diseases of esophagus                           K31.89, Other diseases of stomach and duodenum  R13.10, Dysphagia, unspecified CPT copyright 2019 [employer]. All rights reserved. The codes documented in this report are preliminary and upon coder review may  be revised to meet current compliance requirements. Hildred Laser, MD Hildred Laser, MD 04/25/2021 4:10:54 PM This report has been signed electronically. Number of Addenda: 0

## 2021-04-25 NOTE — H&P (View-Only) (Signed)
[facility] Progress Note ? ?1 Day Post-Op  ?Subjective: ?Patient seen and examined.  He is resting comfortably in bed.  His abdominal pain is significantly improved since yesterday.  He denies nausea and vomiting.  He also denies flatus and bowel movement since prior to admission.   ? ?Objective: ?Vital signs in last 24 hours: ?Temp:  [97.5 ?F (36.4 ?C)-99.1 ?F (37.3 ?C)] 98.4 ?F (36.9 ?C) (03/01 0556) ?Pulse Rate:  [63-117] 63 (03/01 0556) ?Resp:  [16-21] 19 (03/01 0556) ?BP: (104-142)/(67-101) 112/70 (03/01 0556) ?SpO2:  [90 %-100 %] 100 % (03/01 0556) ?FiO2 (%):  [28 %] 28 % (02/28 2000) ?Last BM Date : 04/23/21 ? ?Intake/Output from previous day: ?02/28 0701 - 03/01 0700 ?In: 3547.8 [I.V.:3447.8; IV Piggyback:100] ?Out: 700 [Urine:700] ?Intake/Output this shift: ?No intake/output data recorded. ? ?General appearance: alert, cooperative, and no distress ?GI: abdomen soft, nondistended, no percussion tenderness, no TTP, no rigidity, guarding, or rebound tenderness ? ?Lab Results:  ?Recent Labs  ?  04/24/21 ?0559 04/25/21 ?0413  ?WBC 8.5 8.9  ?HGB 13.8 13.7  ?HCT 40.3 40.4  ?PLT 161 154  ? ?BMET ?Recent Labs  ?  04/24/21 ?0559 04/25/21 ?0413  ?NA 136 136  ?K 4.0 4.3  ?CL 101 102  ?CO2 27 26  ?GLUCOSE 128* 147*  ?BUN 18 17  ?CREATININE 0.86 0.91  ?CALCIUM 9.0 8.6*  ? ?PT/INR ?No results for input(s): LABPROT, INR in the last 72 hours. ? ?Studies/Results: ?NM Hepato W/EF ? ?Result Date: 04/24/2021 ?CLINICAL DATA:  Acute abdominal pain for 1 day, abnormal ultrasound demonstrating markedly thickened gallbladder wall, borderline CBD dilatation, and gallbladder sludge; increasing LFTs and pain EXAM: NUCLEAR MEDICINE HEPATOBILIARY IMAGING TECHNIQUE: Sequential images of the abdomen were obtained out to 60 minutes following intravenous administration of radiopharmaceutical. RADIOPHARMACEUTICALS:  5.5 mCi Tc-40m  Choletec IV COMPARISON:  CT abdomen and pelvis 04/23/2021, ultrasound abdomen 04/23/2021  FINDINGS: Adequate clearance of tracer from bloodstream indicating normal hepatocellular function. Markedly delayed excretion of tracer into the biliary tree. At 1 hour, only a small amount of tracer within proximal CBD is noted. Gallbladder had not visualized. Patient received morphine following which the gallbladder visualized indicating patency of the cystic duct. In combination with the thickened gallbladder wall identified on ultrasound, this could represent chronic cholecystitis. Delayed image performed at 18 hours demonstrates marked persistent retention of tracer within the liver as well as tracer within the gallbladder. Only faint tracer is seen within bowel. Findings are consistent with high-grade CBD obstruction. IMPRESSION: Patent cystic duct. Only faint tracer is seen within the bowel at 18 hours, with most of tracer retained within hepatic parenchyma and gallbladder, consistent with a high-grade common bile duct obstruction. Consider MRCP imaging and/or ERCP. Electronically Signed   By: Lavonia Dana M.D.   On: 04/24/2021 09:10  ? ?US Abdomen Limited ? ?Addendum Date: 04/23/2021   ?ADDENDUM REPORT: 04/23/2021 12:07 ADDENDUM: The common bile duct measures 6.5 mm which is borderline. The patient's bilirubin was within normal limits today. Recommend clinical correlation. Electronically Signed   By: Dorise Bullion III M.D.   On: 04/23/2021 12:07  ? ?Result Date: 04/23/2021 ?CLINICAL DATA:  Abdominal pain with nausea and vomiting. EXAM: ULTRASOUND ABDOMEN LIMITED RIGHT UPPER QUADRANT COMPARISON:  None. FINDINGS: Gallbladder: Sludge fills most of the gallbladder. Gallbladder wall thickening of 5 mm identified. There is a small amount pericholecystic fluid is well. Gallbladder wall may be edematous. No Murphy's sign. No stones identified. Common bile duct: Diameter: 6.5 mm Liver: Increased  heterogeneous echotexture. No focal mass. Portal vein is patent on color Doppler imaging with normal direction of blood flow  towards the liver. Other: None. IMPRESSION: 1. The gallbladder is nearly filled with sludge with gallbladder wall thickening. The gallbladder wall also appears somewhat edematous and there is a small amount of pericholecystic fluid. No stones or Murphy's sign. Acute cholecystitis not excluded. If the clinical picture remains ambiguous, recommend a HIDA scan. 2. Probable hepatic steatosis. Electronically Signed: By: Dorise Bullion III M.D. On: 04/23/2021 10:55  ? ?DG ERCP ? ?Result Date: 04/24/2021 ?CLINICAL DATA:  Jaundice.  Suspected bile duct stones. EXAM: ERCP TECHNIQUE: Multiple spot images obtained with the fluoroscopic device and submitted for interpretation post-procedure. FLUOROSCOPY: Radiation Exposure Index (as provided by the fluoroscopic device): 33.6 mGy Kerma COMPARISON:  Nuclear medicine hepatobiliary exam 04/23/2021 FINDINGS: Retrograde cholangiogram demonstrates mild dilatation of the common bile duct. No large filling defects noted on the retrograde cholangiogram. Wire was advanced into the central intrahepatic bile ducts. Balloon sweep was performed in the common bile duct. IMPRESSION: 1. Retrograde cholangiogram with balloon sweep. 2. Mild dilatation of the common bile duct. These images were submitted for radiologic interpretation only. Please see the procedural report for the amount of contrast and the fluoroscopy time utilized. Electronically Signed   By: Markus Daft M.D.   On: 04/24/2021 20:48   ? ?Anti-infectives: ?Anti-infectives (From admission, onward)  ? ? Start     Dose/Rate Route Frequency Ordered Stop  ? 04/24/21 1815  cefTRIAXone (ROCEPHIN) 2 g in sodium chloride 0.9 % 100 mL IVPB       ? 2 g ?200 mL/hr over 30 Minutes Intravenous Every 24 hours 04/24/21 1720    ? ?  ? ? ?Assessment/Plan: ? ?Patient is a 46 year old male who was admitted with gallstone pancreatitis, s/p ERCP on 2/28 with sphincterotomy. ? ?-ERCP demonstrated sludge in the CBD that was able to be cleared ?-Lipase  improved to 195 today from 1102 yesterday ?-Tbili 2.8 from 7.7 yesterday ?-AM labs tomorrow ?-Will plan for laparoscopic cholecystectomy tomorrow ?-Ok for CLD today ?-NPO at midnight ?-PRN pain control and antiemetics ?-I counseled the patient about the indication, risks and benefits of laparoscopic cholecystectomy.  He understands there is a very small chance for bleeding, infection, injury to normal structures (including common bile duct), conversion to open surgery, persistent symptoms, evolution of postcholecystectomy diarrhea, need for secondary interventions, anesthesia reaction, cardiopulmonary issues and other risks not specifically detailed here. I described the expected recovery, the plan for follow-up and the restrictions during the recovery phase.  All questions were answered. ? ? ? LOS: 2 days  ? ? ?Destin Vinsant A Livian Vanderbeck ?04/25/2021 ? ?

## 2021-04-25 NOTE — Progress Notes (Signed)
[facility] Progress Note ? ?1 Day Post-Op  ?Subjective: ?Patient seen and examined.  He is resting comfortably in bed.  His abdominal pain is significantly improved since yesterday.  He denies nausea and vomiting.  He also denies flatus and bowel movement since prior to admission.   ? ?Objective: ?Vital signs in last 24 hours: ?Temp:  [97.5 ?F (36.4 ?C)-99.1 ?F (37.3 ?C)] 98.4 ?F (36.9 ?C) (03/01 0556) ?Pulse Rate:  [63-117] 63 (03/01 0556) ?Resp:  [16-21] 19 (03/01 0556) ?BP: (104-142)/(67-101) 112/70 (03/01 0556) ?SpO2:  [90 %-100 %] 100 % (03/01 0556) ?FiO2 (%):  [28 %] 28 % (02/28 2000) ?Last BM Date : 04/23/21 ? ?Intake/Output from previous day: ?02/28 0701 - 03/01 0700 ?In: 3547.8 [I.V.:3447.8; IV Piggyback:100] ?Out: 700 [Urine:700] ?Intake/Output this shift: ?No intake/output data recorded. ? ?General appearance: alert, cooperative, and no distress ?GI: abdomen soft, nondistended, no percussion tenderness, no TTP, no rigidity, guarding, or rebound tenderness ? ?Lab Results:  ?Recent Labs  ?  04/24/21 ?0559 04/25/21 ?0413  ?WBC 8.5 8.9  ?HGB 13.8 13.7  ?HCT 40.3 40.4  ?PLT 161 154  ? ?BMET ?Recent Labs  ?  04/24/21 ?0559 04/25/21 ?0413  ?NA 136 136  ?K 4.0 4.3  ?CL 101 102  ?CO2 27 26  ?GLUCOSE 128* 147*  ?BUN 18 17  ?CREATININE 0.86 0.91  ?CALCIUM 9.0 8.6*  ? ?PT/INR ?No results for input(s): LABPROT, INR in the last 72 hours. ? ?Studies/Results: ?NM Hepato W/EF ? ?Result Date: 04/24/2021 ?CLINICAL DATA:  Acute abdominal pain for 1 day, abnormal ultrasound demonstrating markedly thickened gallbladder wall, borderline CBD dilatation, and gallbladder sludge; increasing LFTs and pain EXAM: NUCLEAR MEDICINE HEPATOBILIARY IMAGING TECHNIQUE: Sequential images of the abdomen were obtained out to 60 minutes following intravenous administration of radiopharmaceutical. RADIOPHARMACEUTICALS:  5.5 mCi Tc-79m  Choletec IV COMPARISON:  CT abdomen and pelvis 04/23/2021, ultrasound abdomen 04/23/2021  FINDINGS: Adequate clearance of tracer from bloodstream indicating normal hepatocellular function. Markedly delayed excretion of tracer into the biliary tree. At 1 hour, only a small amount of tracer within proximal CBD is noted. Gallbladder had not visualized. Patient received morphine following which the gallbladder visualized indicating patency of the cystic duct. In combination with the thickened gallbladder wall identified on ultrasound, this could represent chronic cholecystitis. Delayed image performed at 18 hours demonstrates marked persistent retention of tracer within the liver as well as tracer within the gallbladder. Only faint tracer is seen within bowel. Findings are consistent with high-grade CBD obstruction. IMPRESSION: Patent cystic duct. Only faint tracer is seen within the bowel at 18 hours, with most of tracer retained within hepatic parenchyma and gallbladder, consistent with a high-grade common bile duct obstruction. Consider MRCP imaging and/or ERCP. Electronically Signed   By: Lavonia Dana M.D.   On: 04/24/2021 09:10  ? ?US Abdomen Limited ? ?Addendum Date: 04/23/2021   ?ADDENDUM REPORT: 04/23/2021 12:07 ADDENDUM: The common bile duct measures 6.5 mm which is borderline. The patient's bilirubin was within normal limits today. Recommend clinical correlation. Electronically Signed   By: Dorise Bullion III M.D.   On: 04/23/2021 12:07  ? ?Result Date: 04/23/2021 ?CLINICAL DATA:  Abdominal pain with nausea and vomiting. EXAM: ULTRASOUND ABDOMEN LIMITED RIGHT UPPER QUADRANT COMPARISON:  None. FINDINGS: Gallbladder: Sludge fills most of the gallbladder. Gallbladder wall thickening of 5 mm identified. There is a small amount pericholecystic fluid is well. Gallbladder wall may be edematous. No Murphy's sign. No stones identified. Common bile duct: Diameter: 6.5 mm Liver: Increased  heterogeneous echotexture. No focal mass. Portal vein is patent on color Doppler imaging with normal direction of blood flow  towards the liver. Other: None. IMPRESSION: 1. The gallbladder is nearly filled with sludge with gallbladder wall thickening. The gallbladder wall also appears somewhat edematous and there is a small amount of pericholecystic fluid. No stones or Murphy's sign. Acute cholecystitis not excluded. If the clinical picture remains ambiguous, recommend a HIDA scan. 2. Probable hepatic steatosis. Electronically Signed: By: Dorise Bullion III M.D. On: 04/23/2021 10:55  ? ?DG ERCP ? ?Result Date: 04/24/2021 ?CLINICAL DATA:  Jaundice.  Suspected bile duct stones. EXAM: ERCP TECHNIQUE: Multiple spot images obtained with the fluoroscopic device and submitted for interpretation post-procedure. FLUOROSCOPY: Radiation Exposure Index (as provided by the fluoroscopic device): 33.6 mGy Kerma COMPARISON:  Nuclear medicine hepatobiliary exam 04/23/2021 FINDINGS: Retrograde cholangiogram demonstrates mild dilatation of the common bile duct. No large filling defects noted on the retrograde cholangiogram. Wire was advanced into the central intrahepatic bile ducts. Balloon sweep was performed in the common bile duct. IMPRESSION: 1. Retrograde cholangiogram with balloon sweep. 2. Mild dilatation of the common bile duct. These images were submitted for radiologic interpretation only. Please see the procedural report for the amount of contrast and the fluoroscopy time utilized. Electronically Signed   By: Markus Daft M.D.   On: 04/24/2021 20:48   ? ?Anti-infectives: ?Anti-infectives (From admission, onward)  ? ? Start     Dose/Rate Route Frequency Ordered Stop  ? 04/24/21 1815  cefTRIAXone (ROCEPHIN) 2 g in sodium chloride 0.9 % 100 mL IVPB       ? 2 g ?200 mL/hr over 30 Minutes Intravenous Every 24 hours 04/24/21 1720    ? ?  ? ? ?Assessment/Plan: ? ?Patient is a 46 year old male who was admitted with gallstone pancreatitis, s/p ERCP on 2/28 with sphincterotomy. ? ?-ERCP demonstrated sludge in the CBD that was able to be cleared ?-Lipase  improved to 195 today from 1102 yesterday ?-Tbili 2.8 from 7.7 yesterday ?-AM labs tomorrow ?-Will plan for laparoscopic cholecystectomy tomorrow ?-Ok for CLD today ?-NPO at midnight ?-PRN pain control and antiemetics ?-I counseled the patient about the indication, risks and benefits of laparoscopic cholecystectomy.  He understands there is a very small chance for bleeding, infection, injury to normal structures (including common bile duct), conversion to open surgery, persistent symptoms, evolution of postcholecystectomy diarrhea, need for secondary interventions, anesthesia reaction, cardiopulmonary issues and other risks not specifically detailed here. I described the expected recovery, the plan for follow-up and the restrictions during the recovery phase.  All questions were answered. ? ? ? LOS: 2 days  ? ? ?Laressa Bolinger A Jerico Grisso ?04/25/2021 ? ?

## 2021-04-25 NOTE — Progress Notes (Signed)
Patient aware not to eat or drink anything after midnight, informed consent signed for surgery in am. ?

## 2021-04-25 NOTE — Progress Notes (Signed)
Subjective: Patient reports he is feeling okay this morning, had some pain in LUQ after he had some liquids this morning. No BM since before admission, no flatulence but is belching. Denies nausea or vomiting. General surgery saw patient with plans to proceed with cholecystectomy tomorrow. Denies chills or feeling febrile.   Objective: Vital signs in last 24 hours: Temp:  [97.5 F (36.4 C)-99.1 F (37.3 C)] 98.4 F (36.9 C) (03/01 0556) Pulse Rate:  [63-117] 63 (03/01 0556) Resp:  [16-21] 19 (03/01 0556) BP: (104-142)/(67-101) 112/70 (03/01 0556) SpO2:  [90 %-100 %] 100 % (03/01 0556) FiO2 (%):  [28 %] 28 % (02/28 2000) Last BM Date : 04/23/21 General:   Alert and oriented, pleasant Head:  Normocephalic and atraumatic. Eyes:  No icterus, sclera clear. Conjuctiva pink.  Mouth:  Without lesions, mucosa pink and moist.  Neck:  Supple, without thyromegaly or masses.  Heart:  S1, S2 present, no murmurs noted.  Lungs: Clear to auscultation bilaterally, without wheezing, rales, or rhonchi.  Abdomen:  Bowel sounds present, soft, non-distended. Mild TTP of LUQ. No HSM or hernias noted. No rebound or guarding. No masses appreciated  Msk:  Symmetrical without gross deformities. Normal posture. Pulses:  Normal pulses noted. Extremities:  Without clubbing or edema. Neurologic:  Alert and  oriented x4;  grossly normal neurologically. Skin:  Warm and dry, intact without significant lesions.  Cervical Nodes:  No significant cervical adenopathy. Psych:  Alert and cooperative. Normal mood and affect.  Intake/Output from previous day: 02/28 0701 - 03/01 0700 In: 3547.8 [I.V.:3447.8; IV Piggyback:100] Out: 700 [Urine:700] Intake/Output this shift: No intake/output data recorded.  Lab Results: Recent Labs    04/23/21 0101 04/24/21 0559 04/25/21 0413  WBC 6.2 8.5 8.9  HGB 14.1 13.8 13.7  HCT 41.0 40.3 40.4  PLT 175 161 154   BMET Recent Labs    04/23/21 0101 04/24/21 0559  04/25/21 0413  NA 139 136 136  K 3.7 4.0 4.3  CL 102 101 102  CO2 '30 27 26  ' GLUCOSE 109* 128* 147*  BUN 24* 18 17  CREATININE 1.02 0.86 0.91  CALCIUM 9.0 9.0 8.6*   LFT Recent Labs    04/24/21 0559 04/24/21 1300 04/25/21 0413  PROT 7.2   7.3 7.2 7.1  ALBUMIN 3.9   3.9 3.9 3.6  AST 173*   169* 189* 150*  ALT 241*   243* 260* 277*  ALKPHOS 91   91 97 129*  BILITOT 6.4*   6.4* 7.7* 2.8*  BILIDIR 3.4* 4.4*  --   IBILI 3.0* 3.3*  --    Studies/Results: NM Hepato W/EF  Result Date: 04/24/2021 CLINICAL DATA:  Acute abdominal pain for 1 day, abnormal ultrasound demonstrating markedly thickened gallbladder wall, borderline CBD dilatation, and gallbladder sludge; increasing LFTs and pain EXAM: NUCLEAR MEDICINE HEPATOBILIARY IMAGING TECHNIQUE: Sequential images of the abdomen were obtained out to 60 minutes following intravenous administration of radiopharmaceutical. RADIOPHARMACEUTICALS:  5.5 mCi Tc-62m Choletec IV COMPARISON:  CT abdomen and pelvis 04/23/2021, ultrasound abdomen 04/23/2021 FINDINGS: Adequate clearance of tracer from bloodstream indicating normal hepatocellular function. Markedly delayed excretion of tracer into the biliary tree. At 1 hour, only a small amount of tracer within proximal CBD is noted. Gallbladder had not visualized. Patient received morphine following which the gallbladder visualized indicating patency of the cystic duct. In combination with the thickened gallbladder wall identified on ultrasound, this could represent chronic cholecystitis. Delayed image performed at 18 hours demonstrates marked persistent retention of tracer within  the liver as well as tracer within the gallbladder. Only faint tracer is seen within bowel. Findings are consistent with high-grade CBD obstruction. IMPRESSION: Patent cystic duct. Only faint tracer is seen within the bowel at 18 hours, with most of tracer retained within hepatic parenchyma and gallbladder, consistent with a high-grade  common bile duct obstruction. Consider MRCP imaging and/or ERCP. Electronically Signed   By: Lavonia Dana M.D.   On: 04/24/2021 09:10   US Abdomen Limited  Addendum Date: 04/23/2021   ADDENDUM REPORT: 04/23/2021 12:07 ADDENDUM: The common bile duct measures 6.5 mm which is borderline. The patient's bilirubin was within normal limits today. Recommend clinical correlation. Electronically Signed   By: Dorise Bullion III M.D.   On: 04/23/2021 12:07   Result Date: 04/23/2021 CLINICAL DATA:  Abdominal pain with nausea and vomiting. EXAM: ULTRASOUND ABDOMEN LIMITED RIGHT UPPER QUADRANT COMPARISON:  None. FINDINGS: Gallbladder: Sludge fills most of the gallbladder. Gallbladder wall thickening of 5 mm identified. There is a small amount pericholecystic fluid is well. Gallbladder wall may be edematous. No Murphy's sign. No stones identified. Common bile duct: Diameter: 6.5 mm Liver: Increased heterogeneous echotexture. No focal mass. Portal vein is patent on color Doppler imaging with normal direction of blood flow towards the liver. Other: None. IMPRESSION: 1. The gallbladder is nearly filled with sludge with gallbladder wall thickening. The gallbladder wall also appears somewhat edematous and there is a small amount of pericholecystic fluid. No stones or Murphy's sign. Acute cholecystitis not excluded. If the clinical picture remains ambiguous, recommend a HIDA scan. 2. Probable hepatic steatosis. Electronically Signed: By: Dorise Bullion III M.D. On: 04/23/2021 10:55   DG ERCP  Result Date: 04/24/2021 CLINICAL DATA:  Jaundice.  Suspected bile duct stones. EXAM: ERCP TECHNIQUE: Multiple spot images obtained with the fluoroscopic device and submitted for interpretation post-procedure. FLUOROSCOPY: Radiation Exposure Index (as provided by the fluoroscopic device): 33.6 mGy Kerma COMPARISON:  Nuclear medicine hepatobiliary exam 04/23/2021 FINDINGS: Retrograde cholangiogram demonstrates mild dilatation of the common  bile duct. No large filling defects noted on the retrograde cholangiogram. Wire was advanced into the central intrahepatic bile ducts. Balloon sweep was performed in the common bile duct. IMPRESSION: 1. Retrograde cholangiogram with balloon sweep. 2. Mild dilatation of the common bile duct. These images were submitted for radiologic interpretation only. Please see the procedural report for the amount of contrast and the fluoroscopy time utilized. Electronically Signed   By: Markus Daft M.D.   On: 04/24/2021 20:48    Assessment: Leory Plowman A. Ihnen is a 46 year old male with acute onset of upper abdominal pain. Work-up reveals cholelithiasis, CBD at upper limit of normal but without choledocholithiasis on ultrasound and CT.  trending up LFTs.  He had HIDA scan yesterday which revealed faint tracer activity within the small bowel seen at 18 hours concerning for biliary obstruction.  LFTs were repeated yesterday around noon and  bilirubin was up to 7.7 with AST and ALT trending upwards.  Lipase had risen to over 1100. Suspected biliary pancreatitis.  Biliary pancreatitis: patient underwent ERCP yesterday afternoon with findings of  ERCP with prominent ampulla of Vater w/o flow of bile across it, CBD cannulated, resistances noted as catheter tip advanced into bile duct, cholangiogram revealed mildly dilated bile duction w/o filling defects, biliary sphincterotomy performed with removal of sludge which was felt to be without stone, pancreatic duct not cannulated. LFTs today with AST 150 (189), ALT 277 (260), alk phos 129(97), T bili 2.8(7.7), reassuringly bilirubin has improved significantly and  Lipase down to 195 from 1102 yesterday. Patient seen by surgery with plans for lap chole tomorrow.   Dysphagia: pt reports this is very infrequent with feeling that food gets hung up in chest area, he feels it usually is related to how fast he eats. 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 Plan for biopsy of salmon colored mucosa in 3 months after PPI therapy. H pylori pending. Pt reports no PPI on outpatient basis, has occasional reflux, usually if he eats something spicy or tomato based, will notice it after going to bed. Takes tums PRN. Started on PPI BID after EGD.  Plan: Continue rocephon 2g Q24H Trend LFTs daily Pain management per hospitalist PPI BID Continue supportive measures   LOS: 2 days    04/25/2021, 9:16 AM   Dale An L. Alver Sorrow, MSN, APRN, AGNP-C Adult-Gerontology Nurse Practitioner [facility]

## 2021-04-25 NOTE — Hospital Course (Addendum)
46 y.o. male with medical history significant of hyperlipidemia who presents to the emergency department due to abdominal pain which started around bedtime.  Patient states that he had a [place] dinner yesterday in the evening, he complained of a nonradiating epigastric pain which was rated as 9/10 on pain scale that started when he went to bed, he thought it was due to indigestion, so he took Tums and Pepto-Bismol without any relief, abdominal pain was associated with nausea and nonbloody vomiting.  Patient states that he has been taking Aleve twice daily for 4 to 5 days due to seasonal allergy.  He also complained of several months of feeling that food or meds get stuck at the bottom of his esophagus which appears to eventually resolve with time.  He denies fever, chills, chest pain, shortness of breath, headache, diarrhea or constipation. ? ?ED course: ?In the emergency department, patient was hemodynamically stable.  Work-up in the ED showed normal CBC and BMP except for elevated BUN at 24. Lipase 384, AST 95, ALT 72 ?CT abdomen and pelvis with contrast showed no acute intra-abdominal or pelvic pathology ?He was treated with pain medication, GI cocktail, Zofran and IV hydration was provided.  Hospitalist was asked to admit patient for further evaluation and management. ? ? ?04/25/2021: Pt had ERCP on 2/28 with findings of sludge in CBD. Clears started.  Surgery team planning lap chole on 3/2.   ? ?04/26/2021: Pt to OR for lap chole, tolerated well, reg diet started, possible DC home in next 24 hours if continues improving, tolerating diet and pain controlled.   ? ?04/27/2021:  Pt tolerating diet well, feeling better. Surgery cleared patient to discharge home today and arranged outpatient follow up.   ?

## 2021-04-25 NOTE — Progress Notes (Signed)
PROGRESS NOTE   Dale Davis  WNU:[id number] DOB: 1975-05-06 DOA: 04/23/2021 PCP: Jearld Fenton, NP   Chief Complaint  Patient presents with   Abdominal Pain   Level of care: Med-Surg  Brief Admission History:  46 y.o. male with medical history significant of hyperlipidemia who presents to the emergency department due to abdominal pain which started around bedtime.  Patient states that he had a [place] dinner yesterday in the evening, he complained of a nonradiating epigastric pain which was rated as 9/10 on pain scale that started when he went to bed, he thought it was due to indigestion, so he took Tums and Pepto-Bismol without any relief, abdominal pain was associated with nausea and nonbloody vomiting.  Patient states that he has been taking Aleve twice daily for 4 to 5 days due to seasonal allergy.  He also complained of several months of feeling that food or meds get stuck at the bottom of his esophagus which appears to eventually resolve with time.  He denies fever, chills, chest pain, shortness of breath, headache, diarrhea or constipation.  ED course: In the emergency department, patient was hemodynamically stable.  Work-up in the ED showed normal CBC and BMP except for elevated BUN at 24. Lipase 384, AST 95, ALT 72 CT abdomen and pelvis with contrast showed no acute intra-abdominal or pelvic pathology He was treated with pain medication, GI cocktail, Zofran and IV hydration was provided.  Hospitalist was asked to admit patient for further evaluation and management.   04/25/2021: Pt had ERCP on 2/28 with findings of sludge in CBD. Clears started.  Surgery team planning lap chole on 3/2.     Assessment and Plan: * Abdominal pain- (present on admission) This is possibly secondary to presumed gallstone pancreatitis  Surgery planning lap chole on 3/2.  Continue supportive care and pain management.   CLD started today, NPO after midnight  Biliary obstruction- (present on  admission) ERCP completed 2/28 by Dr. Laural Golden.   Elevated lipase Lipase trending down.  CLD started 3/1.   Transaminitis- (present on admission) AST /ALT mostly unchanged Anticipating lap chole on 3/2   Pancreatitis, acute Lap chole in AM.  Clear liquid diet.   NPO after midnight.   HLD (hyperlipidemia)- (present on admission) Patient states that he stopped taking home Crestor about 4 to 5 months ago due to diarrhea when taking this.  DVT prophylaxis: SCD Code Status: full  Family Communication: bedside update 3/1 Disposition: Status is: Inpatient Remains inpatient appropriate because: to OR on 3/2  Consultants:  Surgery  Procedures:  Tentative plan for lap chole 3/2 Antimicrobials:    Subjective: Overall has been feeling better, less abdominal pain.  No emesis.  Objective: Vitals:   04/24/21 2124 04/24/21 2333 04/25/21 0556 04/25/21 1240  BP: 108/67 119/72 112/70 116/68  Pulse: 90 71 63 76  Resp: 18 18 19 20   Temp: 98.1 F (36.7 C) 97.9 F (36.6 C) 98.4 F (36.9 C) 98.1 F (36.7 C)  TempSrc: Oral Oral Oral Oral  SpO2: 93% 95% 100% 92%  Weight:      Height:        Intake/Output Summary (Last 24 hours) at 04/25/2021 1605 Last data filed at 04/25/2021 1301 Gross per 24 hour  Intake 3532.87 ml  Output 700 ml  Net 2832.87 ml   Filed Weights   04/23/21 0055  Weight: 97.5 kg   Examination:  General exam: Appears calm and comfortable  Respiratory system: Clear to auscultation. Respiratory effort normal. Cardiovascular  system: normal S1 & S2 heard. No JVD, murmurs, rubs, gallops or clicks. No pedal edema. Gastrointestinal system: Abdomen is nondistended, soft and moderated tenderness in RUQ, epigastric area with light palpation. No organomegaly or masses felt. Normal bowel sounds heard. Central nervous system: Alert and oriented. No focal neurological deficits. Extremities: Symmetric 5 x 5 power. Skin: No rashes, lesions or ulcers. Psychiatry: Judgement and  insight appear normal. Mood & affect appropriate.   Data Reviewed: I have personally reviewed following labs and imaging studies  CBC: Recent Labs  Lab 04/23/21 0101 04/24/21 0559 04/25/21 0413  WBC 6.2 8.5 8.9  HGB 14.1 13.8 13.7  HCT 41.0 40.3 40.4  MCV 88.2 88.6 90.0  PLT 175 161 017    Basic Metabolic Panel: Recent Labs  Lab 04/23/21 0101 04/23/21 0450 04/24/21 0559 04/25/21 0413  NA 139  --  136 136  K 3.7  --  4.0 4.3  CL 102  --  101 102  CO2 30  --  27 26  GLUCOSE 109*  --  128* 147*  BUN 24*  --  18 17  CREATININE 1.02  --  0.86 0.91  CALCIUM 9.0  --  9.0 8.6*  MG  --  2.1  --   --   PHOS  --  2.8  --   --     CBG: Recent Labs  Lab 04/24/21 1131 04/24/21 1708 04/24/21 2346 04/25/21 0619 04/25/21 1101  GLUCAP 132* 83 133* 137* 110*    Recent Results (from the past 240 hour(s))  Resp Panel by RT-PCR (Flu A&B, Covid) Nasopharyngeal Swab     Status: None   Collection Time: 04/23/21  3:25 AM   Specimen: Nasopharyngeal Swab; Nasopharyngeal(NP) swabs in vial transport medium  Result Value Ref Range Status   SARS Coronavirus 2 by RT PCR NEGATIVE NEGATIVE Final    Comment: (NOTE) SARS-CoV-2 target nucleic acids are NOT DETECTED.  The SARS-CoV-2 RNA is generally detectable in upper respiratory specimens during the acute phase of infection. The lowest concentration of SARS-CoV-2 viral copies this assay can detect is 138 copies/mL. A negative result does not preclude SARS-Cov-2 infection and should not be used as the sole basis for treatment or other patient management decisions. A negative result may occur with  improper specimen collection/handling, submission of specimen other than nasopharyngeal swab, presence of viral mutation(s) within the areas targeted by this assay, and inadequate number of viral copies(<138 copies/mL). A negative result must be combined with clinical observations, patient history, and epidemiological information. The expected  result is Negative.  Fact Sheet for Patients:  EntrepreneurPulse.com.au  Fact Sheet for Healthcare Providers:  IncredibleEmployment.be  This test is no t yet approved or cleared by the [place] FDA and  has been authorized for detection and/or diagnosis of SARS-CoV-2 by FDA under an Emergency Use Authorization (EUA). This EUA will remain  in effect (meaning this test can be used) for the duration of the COVID-19 declaration under Section 564(b)(1) of the Act, 21 U.S.C.section 360bbb-3(b)(1), unless the authorization is terminated  or revoked sooner.       Influenza A by PCR NEGATIVE NEGATIVE Final   Influenza B by PCR NEGATIVE NEGATIVE Final    Comment: (NOTE) The Xpert Xpress SARS-CoV-2/FLU/RSV plus assay is intended as an aid in the diagnosis of influenza from Nasopharyngeal swab specimens and should not be used as a sole basis for treatment. Nasal washings and aspirates are unacceptable for Xpert Xpress SARS-CoV-2/FLU/RSV testing.  Fact Sheet for Patients:  EntrepreneurPulse.com.au  Fact Sheet for Healthcare Providers: IncredibleEmployment.be  This test is not yet approved or cleared by the [place] FDA and has been authorized for detection and/or diagnosis of SARS-CoV-2 by FDA under an Emergency Use Authorization (EUA). This EUA will remain in effect (meaning this test can be used) for the duration of the COVID-19 declaration under Section 564(b)(1) of the Act, 21 U.S.C. section 360bbb-3(b)(1), unless the authorization is terminated or revoked.  Performed at [facility], [address], [place], [place] [address]   Surgical pcr screen     Status: None   Collection Time: 04/25/21 10:27 AM   Specimen: Nasal Mucosa; Nasal Swab  Result Value Ref Range Status   MRSA, PCR NEGATIVE NEGATIVE Final   Staphylococcus aureus NEGATIVE NEGATIVE Final    Comment: (NOTE) The Xpert SA Assay (FDA  approved for NASAL specimens in patients 53 years of age and older), is one component of a comprehensive surveillance program. It is not intended to diagnose infection nor to guide or monitor treatment. Performed at [facility], [address], [place], [place] [address]      Radiology Studies: DG ERCP  Result Date: 04/24/2021 CLINICAL DATA:  Jaundice.  Suspected bile duct stones. EXAM: ERCP TECHNIQUE: Multiple spot images obtained with the fluoroscopic device and submitted for interpretation post-procedure. FLUOROSCOPY: Radiation Exposure Index (as provided by the fluoroscopic device): 33.6 mGy Kerma COMPARISON:  Nuclear medicine hepatobiliary exam 04/23/2021 FINDINGS: Retrograde cholangiogram demonstrates mild dilatation of the common bile duct. No large filling defects noted on the retrograde cholangiogram. Wire was advanced into the central intrahepatic bile ducts. Balloon sweep was performed in the common bile duct. IMPRESSION: 1. Retrograde cholangiogram with balloon sweep. 2. Mild dilatation of the common bile duct. These images were submitted for radiologic interpretation only. Please see the procedural report for the amount of contrast and the fluoroscopy time utilized. Electronically Signed   By: Markus Daft M.D.   On: 04/24/2021 20:48    Scheduled Meds:  nicotine  21 mg Transdermal Daily   pantoprazole (PROTONIX) IV  40 mg Intravenous Q12H   senna-docusate  2 tablet Oral BID   Continuous Infusions:  cefTRIAXone (ROCEPHIN)  IV     dextrose 5 % and 0.45% NaCl 125 mL/hr at 04/25/21 1036    LOS: 2 days   Time spent: 37 mins   Eddison Searls Wynetta Emery, MD How to contact the [facility] Attending or Consulting provider [place] or covering provider during after hours [place], for this patient?  Check the care team in [facility] and look for a) attending/consulting [facility] provider listed and b) the [facility] team listed Log into [email or web address] and use [facility]'s universal password to access. If you do not have the  password, please contact the hospital operator. Locate the [facility] provider you are looking for under [facility] and page to a number that you can be directly reached. If you still have difficulty reaching the provider, please page the [facility] (Director on Call) for the Hospitalists listed on amion for assistance.  04/25/2021, 4:05 PM

## 2021-04-25 NOTE — Plan of Care (Signed)
  Problem: Clinical Measurements: Goal: Will remain free from infection Outcome: Not Progressing   Problem: Nutrition: Goal: Adequate nutrition will be maintained Outcome: Not Progressing   Problem: Elimination: Goal: Will not experience complications related to bowel motility Outcome: Not Progressing   Problem: Pain Managment: Goal: General experience of comfort will improve Outcome: Not Progressing   

## 2021-04-25 NOTE — Assessment & Plan Note (Addendum)
ERCP completed 2/28 by Dr. Laural Golden.  ?Lap chole 3/2 ?

## 2021-04-26 ENCOUNTER — Encounter (HOSPITAL_COMMUNITY): Payer: Self-pay | Admitting: Internal Medicine

## 2021-04-26 ENCOUNTER — Telehealth: Payer: Self-pay | Admitting: Gastroenterology

## 2021-04-26 ENCOUNTER — Inpatient Hospital Stay (HOSPITAL_COMMUNITY): Payer: Managed Care, Other (non HMO) | Admitting: Anesthesiology

## 2021-04-26 ENCOUNTER — Encounter (HOSPITAL_COMMUNITY)
Admission: EM | Disposition: A | Discharge status: Home / Self Care | Payer: Self-pay | Source: Home / Self Care | Attending: Family Medicine

## 2021-04-26 ENCOUNTER — Telehealth: Payer: Self-pay

## 2021-04-26 ENCOUNTER — Other Ambulatory Visit: Payer: Self-pay

## 2021-04-26 DIAGNOSIS — E782 Mixed hyperlipidemia: Secondary | ICD-10-CM

## 2021-04-26 DIAGNOSIS — K851 Biliary acute pancreatitis without necrosis or infection: Secondary | ICD-10-CM

## 2021-04-26 HISTORY — PX: CHOLECYSTECTOMY: SHX55

## 2021-04-26 LAB — GLUCOSE, CAPILLARY
Glucose-Capillary: 83 mg/dL (ref 70–99)
Glucose-Capillary: 90 mg/dL (ref 70–99)
Glucose-Capillary: 158 mg/dL — ABNORMAL HIGH (ref 70–99)

## 2021-04-26 SURGERY — LAPAROSCOPIC CHOLECYSTECTOMY
Anesthesia: General | Site: Abdomen

## 2021-04-26 MED ORDER — OXYCODONE HCL 5 MG PO TABS: 5.0000 mg | ORAL_TABLET | ORAL | Status: DC | PRN

## 2021-04-26 MED ORDER — ROCURONIUM BROMIDE 10 MG/ML (PF) SYRINGE
PREFILLED_SYRINGE | INTRAVENOUS | Status: DC | PRN
  Administered 2021-04-26: 50 mg via INTRAVENOUS

## 2021-04-26 MED ORDER — ORAL CARE MOUTH RINSE: 15.0000 mL | Freq: Once | OROMUCOSAL | Status: DC

## 2021-04-26 MED ORDER — SUCCINYLCHOLINE CHLORIDE 200 MG/10ML IV SOSY
PREFILLED_SYRINGE | INTRAVENOUS | Status: AC
  Filled 2021-04-26: qty 10

## 2021-04-26 MED ORDER — SODIUM CHLORIDE 0.9 % IV SOLN: INTRAVENOUS | Status: DC

## 2021-04-26 MED ORDER — ACETAMINOPHEN 500 MG PO TABS
1000.0000 mg | ORAL_TABLET | Freq: Four times a day (QID) | ORAL | Status: DC
  Administered 2021-04-26 – 2021-04-27 (×4): 1000 mg via ORAL
  Filled 2021-04-26 (×4): qty 2

## 2021-04-26 MED ORDER — SODIUM CHLORIDE 0.9 % IR SOLN
Status: DC | PRN
  Administered 2021-04-26: 1000 mL

## 2021-04-26 MED ORDER — FENTANYL CITRATE (PF) 250 MCG/5ML IJ SOLN
INTRAMUSCULAR | Status: AC
  Filled 2021-04-26: qty 5

## 2021-04-26 MED ORDER — HEMOSTATIC AGENTS (NO CHARGE) OPTIME
TOPICAL | Status: DC | PRN
  Administered 2021-04-26 (×2): 1

## 2021-04-26 MED ORDER — KETOROLAC TROMETHAMINE 30 MG/ML IJ SOLN
INTRAMUSCULAR | Status: AC
  Filled 2021-04-26: qty 1

## 2021-04-26 MED ORDER — LACTATED RINGERS IV SOLN: INTRAVENOUS | Status: DC

## 2021-04-26 MED ORDER — SUGAMMADEX SODIUM 500 MG/5ML IV SOLN
INTRAVENOUS | Status: DC | PRN
  Administered 2021-04-26: 200 mg via INTRAVENOUS

## 2021-04-26 MED ORDER — ONDANSETRON HCL 4 MG/2ML IJ SOLN
INTRAMUSCULAR | Status: DC | PRN
  Administered 2021-04-26: 4 mg via INTRAVENOUS

## 2021-04-26 MED ORDER — MIDAZOLAM HCL 2 MG/2ML IJ SOLN
INTRAMUSCULAR | Status: DC | PRN
  Administered 2021-04-26: 2 mg via INTRAVENOUS

## 2021-04-26 MED ORDER — LIDOCAINE HCL (PF) 2 % IJ SOLN
INTRAMUSCULAR | Status: AC
  Filled 2021-04-26: qty 5

## 2021-04-26 MED ORDER — CHLORHEXIDINE GLUCONATE 0.12 % MT SOLN: 15.0000 mL | Freq: Once | OROMUCOSAL | Status: DC

## 2021-04-26 MED ORDER — DEXAMETHASONE SODIUM PHOSPHATE 10 MG/ML IJ SOLN
INTRAMUSCULAR | Status: AC
  Filled 2021-04-26: qty 1

## 2021-04-26 MED ORDER — MEPERIDINE HCL 50 MG/ML IJ SOLN: 6.2500 mg | INTRAMUSCULAR | Status: DC | PRN

## 2021-04-26 MED ORDER — ORAL CARE MOUTH RINSE: 15.0000 mL | Freq: Once | OROMUCOSAL | Status: AC

## 2021-04-26 MED ORDER — PROPOFOL 10 MG/ML IV BOLUS
INTRAVENOUS | Status: AC
Start: 2021-04-26 — End: ?
  Filled 2021-04-26: qty 20

## 2021-04-26 MED ORDER — MIDAZOLAM HCL 2 MG/2ML IJ SOLN
INTRAMUSCULAR | Status: AC
  Filled 2021-04-26: qty 2

## 2021-04-26 MED ORDER — BUPIVACAINE HCL (PF) 0.5 % IJ SOLN
INTRAMUSCULAR | Status: DC | PRN
  Administered 2021-04-26: 14 mL

## 2021-04-26 MED ORDER — PROPOFOL 10 MG/ML IV BOLUS
INTRAVENOUS | Status: DC | PRN
  Administered 2021-04-26: 200 mg via INTRAVENOUS

## 2021-04-26 MED ORDER — FENTANYL CITRATE (PF) 100 MCG/2ML IJ SOLN
INTRAMUSCULAR | Status: DC | PRN
  Administered 2021-04-26: 50 ug via INTRAVENOUS
  Administered 2021-04-26: 100 ug via INTRAVENOUS
  Administered 2021-04-26 (×2): 50 ug via INTRAVENOUS

## 2021-04-26 MED ORDER — CHLORHEXIDINE GLUCONATE 0.12 % MT SOLN
15.0000 mL | Freq: Once | OROMUCOSAL | Status: AC
  Administered 2021-04-26: 15 mL via OROMUCOSAL

## 2021-04-26 MED ORDER — LIDOCAINE HCL (CARDIAC) PF 100 MG/5ML IV SOSY
PREFILLED_SYRINGE | INTRAVENOUS | Status: DC | PRN
Start: 2021-04-26 — End: 2021-04-26
  Administered 2021-04-26: 50 mg via INTRATRACHEAL

## 2021-04-26 MED ORDER — HYDROMORPHONE HCL 1 MG/ML IJ SOLN
0.2500 mg | INTRAMUSCULAR | Status: DC | PRN
  Administered 2021-04-26 (×3): 0.5 mg via INTRAVENOUS
  Filled 2021-04-26 (×3): qty 0.5

## 2021-04-26 MED ORDER — DEXAMETHASONE SODIUM PHOSPHATE 10 MG/ML IJ SOLN
INTRAMUSCULAR | Status: DC | PRN
  Administered 2021-04-26: 10 mg via INTRAVENOUS

## 2021-04-26 MED ORDER — ROCURONIUM BROMIDE 10 MG/ML (PF) SYRINGE
PREFILLED_SYRINGE | INTRAVENOUS | Status: AC
  Filled 2021-04-26: qty 10

## 2021-04-26 MED ORDER — BUPIVACAINE HCL (PF) 0.5 % IJ SOLN
INTRAMUSCULAR | Status: AC
  Filled 2021-04-26: qty 30

## 2021-04-26 MED ORDER — ONDANSETRON HCL 4 MG/2ML IJ SOLN: 4.0000 mg | Freq: Once | INTRAMUSCULAR | Status: DC | PRN

## 2021-04-26 MED ORDER — ONDANSETRON HCL 4 MG/2ML IJ SOLN
INTRAMUSCULAR | Status: AC
  Filled 2021-04-26: qty 2

## 2021-04-26 MED ORDER — SUCCINYLCHOLINE CHLORIDE 200 MG/10ML IV SOSY
PREFILLED_SYRINGE | INTRAVENOUS | Status: DC | PRN
  Administered 2021-04-26: 160 mg via INTRAVENOUS

## 2021-04-26 SURGICAL SUPPLY — 47 items
ADH SKN CLS APL DERMABOND .7 (GAUZE/BANDAGES/DRESSINGS) ×1
APL PRP STRL LF DISP 70% ISPRP (MISCELLANEOUS) ×1
APL SWBSTK 6 STRL LF DISP (MISCELLANEOUS) ×1
APPLICATOR COTTON TIP 6 STRL (MISCELLANEOUS) IMPLANT
APPLICATOR COTTON TIP 6IN STRL (MISCELLANEOUS) ×2 IMPLANT
APPLIER CLIP ROT 10 11.4 M/L (STAPLE) ×2
APR CLP MED LRG 11.4X10 (STAPLE) ×1
BAG RETRIEVAL 10 (BASKET) ×1
BLADE SURG 15 STRL LF DISP TIS (BLADE) ×2 IMPLANT
BLADE SURG 15 STRL SS (BLADE) ×2
CHLORAPREP W/TINT 26 (MISCELLANEOUS) ×3 IMPLANT
CLIP APPLIE ROT 10 11.4 M/L (STAPLE) ×2 IMPLANT
CLOTH BEACON ORANGE TIMEOUT ST (SAFETY) ×3 IMPLANT
COVER LIGHT HANDLE STERIS (MISCELLANEOUS) ×6 IMPLANT
DECANTER SPIKE VIAL GLASS SM (MISCELLANEOUS) ×3 IMPLANT
DERMABOND ADVANCED (GAUZE/BANDAGES/DRESSINGS) ×1
DERMABOND ADVANCED .7 DNX12 (GAUZE/BANDAGES/DRESSINGS) ×2 IMPLANT
ELECT REM PT RETURN 9FT ADLT (ELECTROSURGICAL) ×2
ELECTRODE REM PT RTRN 9FT ADLT (ELECTROSURGICAL) ×2 IMPLANT
GAUZE 4X4 16PLY ~~LOC~~+RFID DBL (SPONGE) ×3 IMPLANT
GLOVE SURG ENC MOIS LTX SZ6.5 (GLOVE) ×3 IMPLANT
GLOVE SURG LTX SZ7 (GLOVE) ×1 IMPLANT
GLOVE SURG POLYISO LF SZ6.5 (GLOVE) ×1 IMPLANT
GLOVE SURG UNDER POLY LF SZ6.5 (GLOVE) ×1 IMPLANT
GLOVE SURG UNDER POLY LF SZ7 (GLOVE) ×12 IMPLANT
GOWN STRL REUS W/TWL LRG LVL3 (GOWN DISPOSABLE) ×9 IMPLANT
HEMOSTAT SNOW SURGICEL 2X4 (HEMOSTASIS) ×4 IMPLANT
INST SET LAPROSCOPIC AP (KITS) ×3 IMPLANT
KIT TURNOVER KIT A (KITS) ×3 IMPLANT
MANIFOLD NEPTUNE II (INSTRUMENTS) ×3 IMPLANT
NDL INSUFFLATION 14GA 120MM (NEEDLE) ×2 IMPLANT
NEEDLE INSUFFLATION 14GA 120MM (NEEDLE) ×2 IMPLANT
NS IRRIG 1000ML POUR BTL (IV SOLUTION) ×3 IMPLANT
PACK LAP CHOLE LZT030E (CUSTOM PROCEDURE TRAY) ×3 IMPLANT
PAD ARMBOARD 7.5X6 YLW CONV (MISCELLANEOUS) ×3 IMPLANT
SET BASIN LINEN APH (SET/KITS/TRAYS/PACK) ×3 IMPLANT
SET TUBE SMOKE EVAC HIGH FLOW (TUBING) ×3 IMPLANT
SLEEVE ENDOPATH XCEL 5M (ENDOMECHANICALS) ×3 IMPLANT
SUT MNCRL AB 4-0 PS2 18 (SUTURE) ×6 IMPLANT
SUT VICRYL 0 UR6 27IN ABS (SUTURE) ×3 IMPLANT
SYS BAG RETRIEVAL 10MM (BASKET) ×1
SYSTEM BAG RETRIEVAL 10MM (BASKET) ×2 IMPLANT
TROCAR ENDO BLADELESS 11MM (ENDOMECHANICALS) ×3 IMPLANT
TROCAR XCEL NON-BLD 5MMX100MML (ENDOMECHANICALS) ×3 IMPLANT
TROCAR XCEL UNIV SLVE 11M 100M (ENDOMECHANICALS) ×3 IMPLANT
TUBE CONNECTING 12X1/4 (SUCTIONS) ×3 IMPLANT
WARMER LAPAROSCOPE (MISCELLANEOUS) ×3 IMPLANT

## 2021-04-26 NOTE — Assessment & Plan Note (Signed)
Lap chole complete 3/2 ?Advanced to regular diet ?

## 2021-04-26 NOTE — Transfer of Care (Signed)
Immediate Anesthesia Transfer of Care Note ? ?Patient: Dale Davis ? ?Procedure(s) Performed: LAPAROSCOPIC CHOLECYSTECTOMY (Abdomen) ? ?Patient Location: PACU ? ?Anesthesia Type:General ? ?Level of Consciousness: drowsy ? ?Airway & Oxygen Therapy: Patient Spontanous Breathing and Patient connected to nasal cannula oxygen ? ?Post-op Assessment: Report given to RN and Post -op Vital signs reviewed and stable ? ?Post vital signs: Reviewed and stable ? ?Last Vitals:  ?Vitals Value Taken Time  ?BP 126/77   ?Temp    ?Pulse 72   ?Resp 14   ?SpO2 94%   ? ? ?Last Pain:  ?Vitals:  ? 04/26/21 0759  ?TempSrc: Oral  ?PainSc: 0-No pain  ?   ? ?Patients Stated Pain Goal: 2 (04/25/21 1911) ? ?Complications: No notable events documented. ?

## 2021-04-26 NOTE — Interval H&P Note (Signed)
History and Physical Interval Note: ? ?04/26/2021 ?8:52 AM ? ?Dale Davis  has presented today for surgery, with the diagnosis of Gallstone pancreatitis.  The various methods of treatment have been discussed with the patient and family. After consideration of risks, benefits and other options for treatment, the patient has consented to  Procedure(s): ?LAPAROSCOPIC CHOLECYSTECTOMY (N/A) as a surgical intervention.  The patient's history has been reviewed, patient examined, no change in status, stable for surgery.  I have reviewed the patient's chart and labs.  Questions were answered to the patient's satisfaction.   ? ? ?Kharizma Lesnick A Helmer Dull ? ? ?

## 2021-04-26 NOTE — Op Note (Signed)
Operative Note ?  ?Preoperative Diagnosis: Gallstone pancreatitis ?  ?Postoperative Diagnosis: Same ?  ?Procedure(s) Performed: Laparoscopic cholecystectomy ?  ?Surgeon: Graciella Freer, DO  ?  ?Assistants: Leeann Must, RN ?  ?Anesthesia: General endotracheal ?  ?Anesthesiologist: Dr. Charna Elizabeth ?  ?Specimens: Gallbladder  ?  ?Estimated Blood Loss: Minimal  ?  ?Blood Replacement: None  ?  ?Complications: None  ?  ?Operative Findings: Mildly inflamed gallbladder ? ?Indications: Patient is a 46 year old male who was admitted to the hospital for gallstone pancreatitis.  He underwent an ERCP which demonstrated sludge within the gallbladder.  He is now agreeable to laparoscopic cholecystectomy. ? ?All risks, benefits, and alternatives to laparoscopic cholecystectomy were discussed with the patient and his family, all of their questions were answered to their expressed satisfaction. The patient expresses he wishes to proceed, and informed consent was obtained. ? ?Procedure: The patient was taken to the operating room and placed supine. General endotracheal anesthesia was induced. Patient is on antibiotics on the floor. An orogastric tube positioned to decompress the stomach. The abdomen was prepared and draped in the usual sterile fashion.  ?  ?A infraumbilical incision was made and a Veress technique was utilized to achieve pneumoperitoneum to 15 mmHg with carbon dioxide. A 11 mm optiview port was placed through the supraumbilical region, and a 10 mm 0-degree operative laparoscope was introduced. The area underlying the trocar and Veress needle were inspected and without evidence of injury.  Remaining trocars were placed under direct vision. Two 5 mm ports were placed in the right abdomen, between the anterior axillary and midclavicular line.  A final 11 mm port was placed through the mid-epigastrium, near the falciform ligament.  ?  ?The gallbladder fundus was elevated cephalad and the infundibulum was retracted to  the patient's right. The gallbladder/cystic duct junction was skeletonized. The cystic artery noted in the triangle of Calot and was also skeletonized.  We then continued liberal medial and lateral dissection until the critical view of safety was achieved.  ?  ?The cystic duct and cystic artery were doubly clipped and divided. The gallbladder was then dissected from the liver bed with electrocautery. The specimen was placed in an Endopouch and was retrieved through the epigastric site. ?  ?Final inspection revealed acceptable hemostasis. Surgical SNOW was placed in the gallbladder bed.  Trocars were removed and pneumoperitoneum was released.  0 Vicryl fascial sutures were used to close the epigastric and umbilical port sites. Skin incisions were closed with 4-0 Monocryl subcuticular sutures and Dermabond. The patient was awakened from anesthesia and extubated without complication.  ?  ?Graciella Freer, DO  ?[facility] ?[place], [place] [address] ?[phone] (office) ? ? ?

## 2021-04-26 NOTE — Anesthesia Procedure Notes (Signed)
Procedure Name: Intubation ?Date/Time: 04/26/2021 9:24 AM ?Performed by: Karna Dupes, CRNA ?Pre-anesthesia Checklist: Patient identified, Emergency Drugs available, Suction available and Patient being monitored ?Patient Re-evaluated:Patient Re-evaluated prior to induction ?Oxygen Delivery Method: Circle system utilized ?Preoxygenation: Pre-oxygenation with 100% oxygen ?Induction Type: IV induction ?Laryngoscope Size: Glidescope and 4 ?Grade View: Grade I ?Tube type: Oral ?Tube size: 7.5 mm ?Number of attempts: 1 ?Airway Equipment and Method: Stylet ?Placement Confirmation: ETT inserted through vocal cords under direct vision, positive ETCO2 and breath sounds checked- equal and bilateral ?Secured at: 23 cm ?Tube secured with: Tape ?Dental Injury: Teeth and Oropharynx as per pre-operative assessment  ? ? ? ? ?

## 2021-04-26 NOTE — Telephone Encounter (Signed)
Referral sent for them to see pt ?

## 2021-04-26 NOTE — Anesthesia Postprocedure Evaluation (Signed)
Anesthesia Post Note ? ?Patient: Dale Davis ? ?Procedure(s) Performed: LAPAROSCOPIC CHOLECYSTECTOMY (Abdomen) ? ?Patient location during evaluation: PACU ?Anesthesia Type: General ?Level of consciousness: awake and alert and oriented ?Pain management: pain level controlled ?Vital Signs Assessment: post-procedure vital signs reviewed and stable ?Respiratory status: spontaneous breathing, nonlabored ventilation and respiratory function stable ?Cardiovascular status: blood pressure returned to baseline and stable ?Postop Assessment: no apparent nausea or vomiting ?Anesthetic complications: no ? ? ?No notable events documented. ? ? ?Last Vitals:  ?Vitals:  ? 04/26/21 1137 04/26/21 1202  ?BP:  118/65  ?Pulse:  94  ?Resp:    ?Temp:  36.7 ?C  ?SpO2: 96% 96%  ?  ?Last Pain:  ?Vitals:  ? 04/26/21 1202  ?TempSrc:   ?PainSc: 5   ? ? ?  ?  ?  ?  ?  ?  ? ?Kyla Duffy C Cathleen Yagi ? ? ? ? ?

## 2021-04-26 NOTE — Anesthesia Preprocedure Evaluation (Addendum)
Anesthesia Evaluation  ?Patient identified by MRN, date of birth, ID band ?Patient awake ? ? ? ?Reviewed: ?Allergy & Precautions, NPO status , Patient's Chart, lab work & pertinent test results ? ?History of Anesthesia Complications ?Negative for: history of anesthetic complications ? ?Airway ?Mallampati: III ? ?TM Distance: >3 FB ?Neck ROM: Full ? ?Mouth opening: Limited Mouth Opening ? Dental ? ?(+) Dental Advisory Given, Teeth Intact ?  ?Pulmonary ?sleep apnea (snoring?) ,  ?  ?Pulmonary exam normal ?breath sounds clear to auscultation ? ? ? ? ? ? Cardiovascular ?negative cardio ROS ?Normal cardiovascular exam ?Rhythm:Regular Rate:Normal ? ? ?  ?Neuro/Psych ?PSYCHIATRIC DISORDERS Anxiety Depression Bipolar Disorder negative neurological ROS ?   ? GI/Hepatic ?negative GI ROS, CBD stones ?  ?Endo/Other  ?negative endocrine ROS ? Renal/GU ?Renal disease  ?negative genitourinary ?  ?Musculoskeletal ?negative musculoskeletal ROS ?(+)  ? Abdominal ?  ?Peds ?negative pediatric ROS ?(+)  Hematology ?negative hematology ROS ?(+)   ?Anesthesia Other Findings ? ? Reproductive/Obstetrics ?negative OB ROS ? ?  ? ? ? ? ? ? ? ? ? ? ? ? ? ?  ?  ? ? ? ? ? ? ? ? ?Anesthesia Physical ? ?Anesthesia Plan ? ?ASA: 2 ? ?Anesthesia Plan: General  ? ?Post-op Pain Management: Dilaudid IV  ? ?Induction: Intravenous ? ?PONV Risk Score and Plan: 4 or greater and Ondansetron and Dexamethasone ? ?Airway Management Planned: Oral ETT and Video Laryngoscope Planned ? ?Additional Equipment:  ? ?Intra-op Plan:  ? ?Post-operative Plan: Extubation in OR ? ?Informed Consent: I have reviewed the patients History and Physical, chart, labs and discussed the procedure including the risks, benefits and alternatives for the proposed anesthesia with the patient or authorized representative who has indicated his/her understanding and acceptance.  ? ? ? ? ? ?Plan Discussed with: CRNA ? ?Anesthesia Plan Comments:    ? ? ? ? ? ?Anesthesia Quick Evaluation ? ?

## 2021-04-26 NOTE — Addendum Note (Signed)
Addended by: Cheron Every on: 04/26/2021 02:21 PM ? ? Modules accepted: Orders ? ?

## 2021-04-26 NOTE — Progress Notes (Signed)
Patient stable this shift.  No new issues.  Patient aware of surgery  later today.  Pain managed with prn medication.  ?

## 2021-04-26 NOTE — Telephone Encounter (Signed)
CALLED PATIENT NO ANSWER LEFT VOICEMAIL FOR A CALL BACK ? ?

## 2021-04-26 NOTE — Progress Notes (Signed)
PROGRESS NOTE   Dale Davis  BMW:[id number] DOB: 1975/11/27 DOA: 04/23/2021 PCP: Jearld Fenton, NP   Chief Complaint  Patient presents with   Abdominal Pain   Level of care: Med-Surg  Brief Admission History:  46 y.o. male with medical history significant of hyperlipidemia who presents to the emergency department due to abdominal pain which started around bedtime.  Patient states that he had a [place] dinner yesterday in the evening, he complained of a nonradiating epigastric pain which was rated as 9/10 on pain scale that started when he went to bed, he thought it was due to indigestion, so he took Tums and Pepto-Bismol without any relief, abdominal pain was associated with nausea and nonbloody vomiting.  Patient states that he has been taking Aleve twice daily for 4 to 5 days due to seasonal allergy.  He also complained of several months of feeling that food or meds get stuck at the bottom of his esophagus which appears to eventually resolve with time.  He denies fever, chills, chest pain, shortness of breath, headache, diarrhea or constipation.  ED course: In the emergency department, patient was hemodynamically stable.  Work-up in the ED showed normal CBC and BMP except for elevated BUN at 24. Lipase 384, AST 95, ALT 72 CT abdomen and pelvis with contrast showed no acute intra-abdominal or pelvic pathology He was treated with pain medication, GI cocktail, Zofran and IV hydration was provided.  Hospitalist was asked to admit patient for further evaluation and management.   04/25/2021: Pt had ERCP on 2/28 with findings of sludge in CBD. Clears started.  Surgery team planning lap chole on 3/2.    04/26/2021: Pt to OR for lap chole, tolerated well, reg diet started, possible DC home in next 24 hours if continues improving, tolerating diet and pain controlled.     Assessment and Plan: * Abdominal pain Secondary to gallstone pancreatitis, resolved now POD#0 s/p lap chole on 3/2.  Surgery  team advanced to regular diet   Biliary obstruction ERCP completed 2/28 by Dr. Laural Golden.  Lap chole 3/2  Elevated lipase Lipase trending down.  Regular diet started 3/2 after lap chole  Transaminitis CMP in AM   Pancreatitis, acute S/p lap chole 3/2.  Diet being advanced today.    HLD (hyperlipidemia) Patient states that he stopped taking home Crestor about 4 to 5 months ago due to diarrhea when taking this.  Gallstone pancreatitis Lap chole complete 3/2 Advanced to regular diet  DVT prophylaxis: SCD Code Status: full  Family Communication: bedside update 3/1 Disposition: Status is: Inpatient Remains inpatient appropriate because: IV pain management   Consultants:  Surgery  Procedures:  S/p lap chole 3/2 Antimicrobials:    Subjective: Tolerating regular diet so far.   Objective: Vitals:   04/26/21 1137 04/26/21 1202 04/26/21 1225 04/26/21 1403  BP:  118/65 126/79 121/78  Pulse:  94 75 69  Resp:   16 20  Temp:  98 F (36.7 C) 98.8 F (37.1 C) 97.8 F (36.6 C)  TempSrc:   Oral Oral  SpO2: 96% 96% 96% 97%  Weight:      Height:        Intake/Output Summary (Last 24 hours) at 04/26/2021 1634 Last data filed at 04/26/2021 1105 Gross per 24 hour  Intake 2300 ml  Output 10 ml  Net 2290 ml   Filed Weights   04/23/21 0055 04/26/21 0759  Weight: 97.5 kg 98.4 kg   Examination:  General exam: Appears calm and comfortable  Respiratory system: Clear to auscultation. Respiratory effort normal. Cardiovascular system: normal S1 & S2 heard. No JVD, murmurs, rubs, gallops or clicks. No pedal edema. Gastrointestinal system: bandages clean, dry, intact Central nervous system: Alert and oriented. No focal neurological deficits. Extremities: Symmetric 5 x 5 power. Skin: No rashes, lesions or ulcers. Psychiatry: Judgement and insight appear normal. Mood & affect appropriate.   Data Reviewed: I have personally reviewed following labs and imaging studies  CBC: Recent  Labs  Lab 04/23/21 0101 04/24/21 0559 04/25/21 0413  WBC 6.2 8.5 8.9  HGB 14.1 13.8 13.7  HCT 41.0 40.3 40.4  MCV 88.2 88.6 90.0  PLT 175 161 347    Basic Metabolic Panel: Recent Labs  Lab 04/23/21 0101 04/23/21 0450 04/24/21 0559 04/25/21 0413  NA 139  --  136 136  K 3.7  --  4.0 4.3  CL 102  --  101 102  CO2 30  --  27 26  GLUCOSE 109*  --  128* 147*  BUN 24*  --  18 17  CREATININE 1.02  --  0.86 0.91  CALCIUM 9.0  --  9.0 8.6*  MG  --  2.1  --   --   PHOS  --  2.8  --   --     CBG: Recent Labs  Lab 04/25/21 0619 04/25/21 1101 04/25/21 2332 04/26/21 0554 04/26/21 1619  GLUCAP 137* 110* 83 90 158*    Recent Results (from the past 240 hour(s))  Resp Panel by RT-PCR (Flu A&B, Covid) Nasopharyngeal Swab     Status: None   Collection Time: 04/23/21  3:25 AM   Specimen: Nasopharyngeal Swab; Nasopharyngeal(NP) swabs in vial transport medium  Result Value Ref Range Status   SARS Coronavirus 2 by RT PCR NEGATIVE NEGATIVE Final    Comment: (NOTE) SARS-CoV-2 target nucleic acids are NOT DETECTED.  The SARS-CoV-2 RNA is generally detectable in upper respiratory specimens during the acute phase of infection. The lowest concentration of SARS-CoV-2 viral copies this assay can detect is 138 copies/mL. A negative result does not preclude SARS-Cov-2 infection and should not be used as the sole basis for treatment or other patient management decisions. A negative result may occur with  improper specimen collection/handling, submission of specimen other than nasopharyngeal swab, presence of viral mutation(s) within the areas targeted by this assay, and inadequate number of viral copies(<138 copies/mL). A negative result must be combined with clinical observations, patient history, and epidemiological information. The expected result is Negative.  Fact Sheet for Patients:  EntrepreneurPulse.com.au  Fact Sheet for Healthcare Providers:   IncredibleEmployment.be  This test is no t yet approved or cleared by the [place] FDA and  has been authorized for detection and/or diagnosis of SARS-CoV-2 by FDA under an Emergency Use Authorization (EUA). This EUA will remain  in effect (meaning this test can be used) for the duration of the COVID-19 declaration under Section 564(b)(1) of the Act, 21 U.S.C.section 360bbb-3(b)(1), unless the authorization is terminated  or revoked sooner.       Influenza A by PCR NEGATIVE NEGATIVE Final   Influenza B by PCR NEGATIVE NEGATIVE Final    Comment: (NOTE) The Xpert Xpress SARS-CoV-2/FLU/RSV plus assay is intended as an aid in the diagnosis of influenza from Nasopharyngeal swab specimens and should not be used as a sole basis for treatment. Nasal washings and aspirates are unacceptable for Xpert Xpress SARS-CoV-2/FLU/RSV testing.  Fact Sheet for Patients: EntrepreneurPulse.com.au  Fact Sheet for Healthcare Providers: IncredibleEmployment.be  This  test is not yet approved or cleared by the [place] and has been authorized for detection and/or diagnosis of SARS-CoV-2 by FDA under an Emergency Use Authorization (EUA). This EUA will remain in effect (meaning this test can be used) for the duration of the COVID-19 declaration under Section 564(b)(1) of the Act, 21 U.S.C. section 360bbb-3(b)(1), unless the authorization is terminated or revoked.  Performed at [facility], [address], [place], [place] [address]   Surgical pcr screen     Status: None   Collection Time: 04/25/21 10:27 AM   Specimen: Nasal Mucosa; Nasal Swab  Result Value Ref Range Status   MRSA, PCR NEGATIVE NEGATIVE Final   Staphylococcus aureus NEGATIVE NEGATIVE Final    Comment: (NOTE) The Xpert SA Assay (FDA approved for NASAL specimens in patients 18 years of age and older), is one component of a comprehensive surveillance program. It is not  intended to diagnose infection nor to guide or monitor treatment. Performed at [facility], [address], [place], [place] [address]      Radiology Studies: DG ERCP  Result Date: 04/24/2021 CLINICAL DATA:  Jaundice.  Suspected bile duct stones. EXAM: ERCP TECHNIQUE: Multiple spot images obtained with the fluoroscopic device and submitted for interpretation post-procedure. FLUOROSCOPY: Radiation Exposure Index (as provided by the fluoroscopic device): 33.6 mGy Kerma COMPARISON:  Nuclear medicine hepatobiliary exam 04/23/2021 FINDINGS: Retrograde cholangiogram demonstrates mild dilatation of the common bile duct. No large filling defects noted on the retrograde cholangiogram. Wire was advanced into the central intrahepatic bile ducts. Balloon sweep was performed in the common bile duct. IMPRESSION: 1. Retrograde cholangiogram with balloon sweep. 2. Mild dilatation of the common bile duct. These images were submitted for radiologic interpretation only. Please see the procedural report for the amount of contrast and the fluoroscopy time utilized. Electronically Signed   By: Markus Daft M.D.   On: 04/24/2021 20:48    Scheduled Meds:  acetaminophen  1,000 mg Oral Q6H   nicotine  21 mg Transdermal Daily   pantoprazole (PROTONIX) IV  40 mg Intravenous Q12H   Continuous Infusions:  cefTRIAXone (ROCEPHIN)  IV 2 g (04/25/21 1724)   dextrose 5 % and 0.45% NaCl 125 mL/hr at 04/26/21 0124    LOS: 3 days   Time spent: 31 mins   Dajai Wahlert Wynetta Emery, MD How to contact the [facility] Attending or Consulting provider [place] or covering provider during after hours [place], for this patient?  Check the care team in [facility] and look for a) attending/consulting [facility] provider listed and b) the [facility] team listed Log into [email or web address] and use [facility]'s universal password to access. If you do not have the password, please contact the hospital operator. Locate the [facility] provider you are looking for under [facility] and  page to a number that you can be directly reached. If you still have difficulty reaching the provider, please page the [facility] (Director on Call) for the Hospitalists listed on amion for assistance.  04/26/2021, 4:34 PM

## 2021-04-26 NOTE — Telephone Encounter (Signed)
[facility] Clinical Pool - If we can, can we arrange for this patient to follow up with [facility] for this patient for hospital follow up and for repeat EGD in 3 months.  ?

## 2021-04-26 NOTE — Progress Notes (Signed)
Update Note: ? ?Spoke to patient's wife, Dale Davis, and the patient's hospital room.  It was explained that his surgery went well and his gallbladder was able to be removed without issue.  I further explained that I have given him a regular diet, however I recommend that he go slow to start especially given his pancreatitis.  I explained that he has dissolvable stitches under the skin with skin glue that will flake off in 10 to 14 days.  We will keep him in the hospital overnight, and if his pain is adequately controlled tomorrow and he is tolerating a diet, he will be good for discharge from a general surgery standpoint.  All of her questions were answered to her expressed satisfaction. ? ?Plan: ?-Heart healthy diet ?-PRN pain control and antiemetics ?-AM labs ?-No need for antibiotics postoperatively from general surgery standpoint ?-Possible discharge home tomorrow if tolerating diet and pain controlled with oral medications ? ?Damascus Feldpausch, DO ?[facility] ?[place], [place] [address] ?[phone] (office) ? ?

## 2021-04-26 NOTE — Discharge Instructions (Addendum)
Surgery Discharge Instructions ? ?Activity ? Resume light activity. No heavy lifting over 10 lbs or strenuous exercise. ? ?Fluids and Diet ?Regular diet ? ?Medications ? If you have not had a bowel movement in 24 hours, take 2 tablespoons over the counter Milk of mag. ?            You May resume your blood thinners tomorrow (Aspirin, coumadin, or other).  ?You are being discharged with prescriptions for Opioid/Narcotic Medications: There are some specific considerations for these medications that you should know. ?Opioid Meds have risks & benefits. Addiction to these meds is always a concern with prolonged use ?Take medication only as directed ?Do not drive while taking narcotic pain medication ?Do not crush tablets or capsules ?Do not use a different container than medication was dispensed in ?Lock the container of medication in a cool, dry place out of reach of children and pets. ?Opioid medication can cause addiction ?Do not share with anyone else (this is a felony) ?Do not store medications for future use. Dispose of them properly. ?    Disposal:  ?Find a [employer] household drug take back site near you.  ?If you can't get to a drug take back site, use the recipe below as a last resort to dispose of expired, unused or unwanted drugs. ?Disposal  ?(Do not dispose chemotherapy drugs this way, talk to your prescribing doctor instead.) Step 1: Mix drugs (do not crush) with dirt, kitty litter, or used coffee grounds and add a small amount of water to dissolve any solid medications. Step 2: Seal drugs in plastic bag. Step 3: Place plastic bag in trash. Step 4: Take prescription container and scratch out personal information, then recycle or throw away. ? ?Operative Site ? You have a liquid bandage over your incisions, this will begin to flake off in about a week. Ok to Games developer. Keep wound clean and dry. No baths or swimming. No lifting more than 10 pounds. ? ?Contact Information: ?If you have questions or  concerns, please call our office, [phone], Monday- Thursday 8AM-5PM and Friday 8AM-12Noon.  ?If it is after hours or on the weekend, please call [facility] Main Number, [phone], and ask to speak to the surgeon on call for Dr. Okey Dupre at [facility].  ? ?SPECIFIC COMPLICATIONS TO WATCH FOR: ?Inability to urinate ?Fever over 101? F by mouth ?Nausea and vomiting lasting longer than 24 hours. ?Pain not relieved by medication ordered ?Swelling around the operative site ?Increased redness, warmth, hardness, around operative area ?Numbness, tingling, or cold fingers or toes ?Blood -soaked dressing, (small amounts of oozing may be normal) ?Increasing and progressive drainage from surgical area or exam site ? ? ? ? ?IMPORTANT INFORMATION: PAY CLOSE ATTENTION  ? ?PHYSICIAN DISCHARGE INSTRUCTIONS ? ?Follow with Primary care provider  Jearld Fenton, NP  and other consultants as instructed by your Hospitalist Physician ? ?SEEK MEDICAL CARE OR RETURN TO EMERGENCY ROOM IF SYMPTOMS COME BACK, WORSEN OR NEW PROBLEM DEVELOPS  ? ?Please note: ?You were cared for by a hospitalist during your hospital stay. Every effort will be made to forward records to your primary care provider.  You can request that your primary care provider send for your hospital records if they have not received them.  Once you are discharged, your primary care physician will handle any further medical issues. Please note that NO REFILLS for any discharge medications will be authorized once you are discharged, as it is imperative that you return to your  primary care physician (or establish a relationship with a primary care physician if you do not have one) for your post hospital discharge needs so that they can reassess your need for medications and monitor your lab values. ? ?Please get a complete blood count and chemistry panel checked by your Primary MD at your next visit, and again as instructed by your Primary MD. ? ?Get Medicines reviewed and  adjusted: ?Please take all your medications with you for your next visit with your Primary MD ? ?Laboratory/radiological data: ?Please request your Primary MD to go over all hospital tests and procedure/radiological results at the follow up, please ask your primary care provider to get all Hospital records sent to his/her office. ? ?In some cases, they will be blood work, cultures and biopsy results pending at the time of your discharge. Please request that your primary care provider follow up on these results. ? ?If you are diabetic, please bring your blood sugar readings with you to your follow up appointment with primary care.   ? ?Please call and make your follow up appointments as soon as possible.   ? ?Also Note the following: ?If you experience worsening of your admission symptoms, develop shortness of breath, life threatening emergency, suicidal or homicidal thoughts you must seek medical attention immediately by calling 911 or calling your MD immediately  if symptoms less severe. ? ?You must read complete instructions/literature along with all the possible adverse reactions/side effects for all the Medicines you take and that have been prescribed to you. Take any new Medicines after you have completely understood and accpet all the possible adverse reactions/side effects.  ? ?Do not drive when taking Pain medications or sleeping medications (Benzodiazepines) ? ?Do not take more than prescribed Pain, Sleep and Anxiety Medications. It is not advisable to combine anxiety,sleep and pain medications without talking with your primary care practitioner ? ?Special Instructions: If you have smoked or chewed Tobacco  in the last 2 yrs please stop smoking, stop any regular Alcohol  and or any Recreational drug use. ? ?Wear Seat belts while driving.  Do not drive if taking any narcotic, mind altering or controlled substances or recreational drugs or alcohol.  ? ? ? ? ? ?

## 2021-04-27 DIAGNOSIS — K831 Obstruction of bile duct: Secondary | ICD-10-CM

## 2021-04-27 DIAGNOSIS — R1310 Dysphagia, unspecified: Secondary | ICD-10-CM

## 2021-04-27 LAB — COMPREHENSIVE METABOLIC PANEL
ALT: 149 U/L — ABNORMAL HIGH (ref 0–44)
AST: 43 U/L — ABNORMAL HIGH (ref 15–41)
Albumin: 3.5 g/dL (ref 3.5–5.0)
Alkaline Phosphatase: 124 U/L (ref 38–126)
Anion gap: 7 (ref 5–15)
BUN: 13 mg/dL (ref 6–20)
CO2: 27 mmol/L (ref 22–32)
Calcium: 9 mg/dL (ref 8.9–10.3)
Chloride: 102 mmol/L (ref 98–111)
Creatinine, Ser: 0.76 mg/dL (ref 0.61–1.24)
GFR, Estimated: >60 mL/min (ref >60)
Glucose, Bld: 117 mg/dL — ABNORMAL HIGH (ref 70–99)
Potassium: 3.8 mmol/L (ref 3.5–5.1)
Sodium: 136 mmol/L (ref 135–145)
Total Bilirubin: 0.8 mg/dL (ref 0.3–1.2)
Total Protein: 7.4 g/dL (ref 6.5–8.1)

## 2021-04-27 LAB — SURGICAL PATHOLOGY

## 2021-04-27 LAB — GLUCOSE, CAPILLARY: Glucose-Capillary: 107 mg/dL — ABNORMAL HIGH (ref 70–99)

## 2021-04-27 LAB — LIPASE, BLOOD: Lipase: 42 U/L (ref 11–51)

## 2021-04-27 LAB — H. PYLORI ANTIBODY, IGG: H Pylori IgG: 0.1 Index Value (ref 0.00–0.79)

## 2021-04-27 MED ORDER — OXYCODONE HCL 5 MG PO TABS: 5.0000 mg | ORAL_TABLET | Freq: Three times a day (TID) | ORAL | 0 refills | Status: AC | PRN

## 2021-04-27 MED ORDER — DOCUSATE SODIUM 100 MG PO CAPS: 100.0000 mg | ORAL_CAPSULE | Freq: Two times a day (BID) | ORAL | 0 refills | Status: DC

## 2021-04-27 NOTE — Discharge Summary (Signed)
Physician Discharge Summary  Dale Davis LKG:[id number] DOB: 1975-05-24 DOA: 04/23/2021  PCP: Lorre Munroe, NP Surgery: Pappayliou Admit date: 04/23/2021 Discharge date: 04/27/2021  Admitted From:  Home  Disposition: Home  Recommendations for Outpatient Follow-up:  Follow up with surgery on 05/08/21   Discharge Condition: STABLE   CODE STATUS: FULL DIET: low fat recommended   Brief Hospitalization Summary: Please see all hospital notes, images, labs for full details of the hospitalization. 46 y.o. male with medical history significant of hyperlipidemia who presents to the emergency department due to abdominal pain which started around bedtime.  Patient states that he had a [place] dinner yesterday in the evening, he complained of a nonradiating epigastric pain which was rated as 9/10 on pain scale that started when he went to bed, he thought it was due to indigestion, so he took Tums and Pepto-Bismol without any relief, abdominal pain was associated with nausea and nonbloody vomiting.  Patient states that he has been taking Aleve twice daily for 4 to 5 days due to seasonal allergy.  He also complained of several months of feeling that food or meds get stuck at the bottom of his esophagus which appears to eventually resolve with time.  He denies fever, chills, chest pain, shortness of breath, headache, diarrhea or constipation.  ED course: In the emergency department, patient was hemodynamically stable.  Work-up in the ED showed normal CBC and BMP except for elevated BUN at 24. Lipase 384, AST 95, ALT 72 CT abdomen and pelvis with contrast showed no acute intra-abdominal or pelvic pathology He was treated with pain medication, GI cocktail, Zofran and IV hydration was provided.  Hospitalist was asked to admit patient for further evaluation and management.   04/25/2021: Pt had ERCP on 2/28 with findings of sludge in CBD. Clears started.  Surgery team planning lap chole on 3/2.    04/26/2021:  Pt to OR for lap chole, tolerated well, reg diet started, possible DC home in next 24 hours if continues improving, tolerating diet and pain controlled.    04/27/2021:  Pt tolerating diet well, feeling better. Surgery cleared patient to discharge home today and arranged outpatient follow up.     Assessment and Plan: * Abdominal pain Secondary to gallstone pancreatitis, resolved now POD#1 s/p lap chole on 3/2.  Surgery team advanced to regular diet   Biliary obstruction ERCP completed 2/28 by Dr. Karilyn Cota.  Lap chole 3/2  Elevated lipase Regular diet started 3/2 after lap chole  Transaminitis LFTs have normalized post lap chole.   Pancreatitis, acute S/p lap chole 3/2.  Resolved now.    HLD (hyperlipidemia) Patient states that he stopped taking home Crestor about 4 to 5 months ago due to diarrhea when taking this.  Pt advised to follow up with PCP.   Gallstone pancreatitis Lap chole complete 3/2 Advanced to regular diet  Discharge Diagnoses:  Principal Problem:   Abdominal pain Active Problems:   Pancreatitis, acute   Transaminitis   Elevated lipase   Biliary obstruction   HLD (hyperlipidemia)   Dysphagia   Cholelithiasis   Gallstone pancreatitis   Discharge Instructions:  Allergies as of 04/27/2021       Reactions   Flomax [tamsulosin Hcl] Rash        Medication List     TAKE these medications    Armodafinil 150 MG tablet TAKE 1/2 TABLET (75 MG TOTAL) BY MOUTH DAILY. What changed: See the new instructions.   docusate sodium 100 MG capsule Commonly known  as: Colace Take 1 capsule (100 mg total) by mouth 2 (two) times daily.   hydrOXYzine 25 MG capsule Commonly known as: VISTARIL TAKE 1-2 CAPSULES BY MOUTH AT BEDTIME AS NEEDED. FOR SEVERE ANXIETY , RACING THOUGHTS, SLEEP What changed: See the new instructions.   oxyCODONE 5 MG immediate release tablet Commonly known as: Roxicodone Take 1 tablet (5 mg total) by mouth every 8 (eight) hours as needed  for up to 5 days for severe pain.        Follow-up Information     Pappayliou, Gustavus Messing, DO. Call.   Specialty: General Surgery Why: Call to follow up with me on 3/14 Contact information: [address] [facility] [address] [phone]         Lorre Munroe, NP. Schedule an appointment as soon as possible for a visit in 2 week(s).   Specialties: Internal Medicine, Emergency Medicine Why: Hospital Follow Up Contact information: [address] [place] [address] [phone]                Allergies  Allergen Reactions   Flomax [Tamsulosin Hcl] Rash   Allergies as of 04/27/2021       Reactions   Flomax [tamsulosin Hcl] Rash        Medication List     TAKE these medications    Armodafinil 150 MG tablet TAKE 1/2 TABLET (75 MG TOTAL) BY MOUTH DAILY. What changed: See the new instructions.   docusate sodium 100 MG capsule Commonly known as: Colace Take 1 capsule (100 mg total) by mouth 2 (two) times daily.   hydrOXYzine 25 MG capsule Commonly known as: VISTARIL TAKE 1-2 CAPSULES BY MOUTH AT BEDTIME AS NEEDED. FOR SEVERE ANXIETY , RACING THOUGHTS, SLEEP What changed: See the new instructions.   oxyCODONE 5 MG immediate release tablet Commonly known as: Roxicodone Take 1 tablet (5 mg total) by mouth every 8 (eight) hours as needed for up to 5 days for severe pain.        Procedures/Studies: CT ABDOMEN PELVIS W CONTRAST  Result Date: 04/23/2021 CLINICAL DATA:  Acute pancreatitis. EXAM: CT ABDOMEN AND PELVIS WITH CONTRAST TECHNIQUE: Multidetector CT imaging of the abdomen and pelvis was performed using the standard protocol following bolus administration of intravenous contrast. RADIATION DOSE REDUCTION: This exam was performed according to the departmental dose-optimization program which includes automated exposure control, adjustment of the mA and/or kV according to patient size and/or use of iterative reconstruction technique.  CONTRAST:  OMNIPAQUE IOHEXOL 300 MG/ML  SOLN COMPARISON:  CT abdomen pelvis dated 12/16/2019. FINDINGS: Lower chest: The visualized lung bases are clear. No intra-abdominal free air or free fluid. Hepatobiliary: Mild fatty liver. No intrahepatic biliary dilatation. The gallbladder is unremarkable. Pancreas: Unremarkable. No pancreatic ductal dilatation or surrounding inflammatory changes. Spleen: Normal in size without focal abnormality. Adrenals/Urinary Tract: The adrenal glands unremarkable. The kidneys, visualized ureters, and urinary bladder appear unremarkable. Stomach/Bowel: There is sigmoid diverticulosis and scattered colonic diverticula without active inflammatory changes. There is no bowel obstruction or active inflammation. The appendix is normal. Vascular/Lymphatic: The abdominal aorta and IVC unremarkable. No portal venous gas. There is no adenopathy. Reproductive: The prostate and seminal vesicles are grossly unremarkable. No pelvic mass. Other: None Musculoskeletal: No acute or significant osseous findings. IMPRESSION: 1. No acute intra-abdominal or pelvic pathology. 2. Colonic diverticulosis. No bowel obstruction. Normal appendix. 3. Mild fatty liver. Electronically Signed   By: Elgie Collard M.D.   On: 04/23/2021 02:56   NM Hepato  W/EF  Result Date: 04/24/2021 CLINICAL DATA:  Acute abdominal pain for 1 day, abnormal ultrasound demonstrating markedly thickened gallbladder wall, borderline CBD dilatation, and gallbladder sludge; increasing LFTs and pain EXAM: NUCLEAR MEDICINE HEPATOBILIARY IMAGING TECHNIQUE: Sequential images of the abdomen were obtained out to 60 minutes following intravenous administration of radiopharmaceutical. RADIOPHARMACEUTICALS:  5.5 mCi Tc-1m  Choletec IV COMPARISON:  CT abdomen and pelvis 04/23/2021, ultrasound abdomen 04/23/2021 FINDINGS: Adequate clearance of tracer from bloodstream indicating normal hepatocellular function. Markedly delayed excretion of  tracer into the biliary tree. At 1 hour, only a small amount of tracer within proximal CBD is noted. Gallbladder had not visualized. Patient received morphine following which the gallbladder visualized indicating patency of the cystic duct. In combination with the thickened gallbladder wall identified on ultrasound, this could represent chronic cholecystitis. Delayed image performed at 18 hours demonstrates marked persistent retention of tracer within the liver as well as tracer within the gallbladder. Only faint tracer is seen within bowel. Findings are consistent with high-grade CBD obstruction. IMPRESSION: Patent cystic duct. Only faint tracer is seen within the bowel at 18 hours, with most of tracer retained within hepatic parenchyma and gallbladder, consistent with a high-grade common bile duct obstruction. Consider MRCP imaging and/or ERCP. Electronically Signed   By: Ulyses Southward M.D.   On: 04/24/2021 09:10   US Abdomen Limited  Addendum Date: 04/23/2021   ADDENDUM REPORT: 04/23/2021 12:07 ADDENDUM: The common bile duct measures 6.5 mm which is borderline. The patient's bilirubin was within normal limits today. Recommend clinical correlation. Electronically Signed   By: Gerome Sam III M.D.   On: 04/23/2021 12:07   Result Date: 04/23/2021 CLINICAL DATA:  Abdominal pain with nausea and vomiting. EXAM: ULTRASOUND ABDOMEN LIMITED RIGHT UPPER QUADRANT COMPARISON:  None. FINDINGS: Gallbladder: Sludge fills most of the gallbladder. Gallbladder wall thickening of 5 mm identified. There is a small amount pericholecystic fluid is well. Gallbladder wall may be edematous. No Murphy's sign. No stones identified. Common bile duct: Diameter: 6.5 mm Liver: Increased heterogeneous echotexture. No focal mass. Portal vein is patent on color Doppler imaging with normal direction of blood flow towards the liver. Other: None. IMPRESSION: 1. The gallbladder is nearly filled with sludge with gallbladder wall thickening.  The gallbladder wall also appears somewhat edematous and there is a small amount of pericholecystic fluid. No stones or Murphy's sign. Acute cholecystitis not excluded. If the clinical picture remains ambiguous, recommend a HIDA scan. 2. Probable hepatic steatosis. Electronically Signed: By: Gerome Sam III M.D. On: 04/23/2021 10:55   DG ERCP  Result Date: 04/24/2021 CLINICAL DATA:  Jaundice.  Suspected bile duct stones. EXAM: ERCP TECHNIQUE: Multiple spot images obtained with the fluoroscopic device and submitted for interpretation post-procedure. FLUOROSCOPY: Radiation Exposure Index (as provided by the fluoroscopic device): 33.6 mGy Kerma COMPARISON:  Nuclear medicine hepatobiliary exam 04/23/2021 FINDINGS: Retrograde cholangiogram demonstrates mild dilatation of the common bile duct. No large filling defects noted on the retrograde cholangiogram. Wire was advanced into the central intrahepatic bile ducts. Balloon sweep was performed in the common bile duct. IMPRESSION: 1. Retrograde cholangiogram with balloon sweep. 2. Mild dilatation of the common bile duct. These images were submitted for radiologic interpretation only. Please see the procedural report for the amount of contrast and the fluoroscopy time utilized. Electronically Signed   By: Richarda Overlie M.D.   On: 04/24/2021 20:48     Subjective: Pt reports feeling better, tolerating diet, pain well controlled.  Wants to go home.   Discharge Exam:  Vitals:   04/27/21 0545 04/27/21 1327  BP: 127/81 124/84  Pulse: 63 68  Resp: 18 18  Temp: (!) 97.4 F (36.3 C) 98.6 F (37 C)  SpO2: 99% 100%   Vitals:   04/26/21 2101 04/27/21 0221 04/27/21 0545 04/27/21 1327  BP: 120/69 135/77 127/81 124/84  Pulse: 76 63 63 68  Resp: 17 18 18 18   Temp: 99.1 F (37.3 C) 97.9 F (36.6 C) (!) 97.4 F (36.3 C) 98.6 F (37 C)  TempSrc:    Oral  SpO2: 95% 99% 99% 100%  Weight:      Height:       General: Pt is alert, awake, not in acute  distress Cardiovascular: normal S1/S2 +, no rubs, no gallops Respiratory: CTA bilaterally, no wheezing, no rhonchi Abdominal: Soft, NT, ND, bowel sounds + Extremities: no edema, no cyanosis   The results of significant diagnostics from this hospitalization (including imaging, microbiology, ancillary and laboratory) are listed below for reference.     Microbiology: Recent Results (from the past 240 hour(s))  Resp Panel by RT-PCR (Flu A&B, Covid) Nasopharyngeal Swab     Status: None   Collection Time: 04/23/21  3:25 AM   Specimen: Nasopharyngeal Swab; Nasopharyngeal(NP) swabs in vial transport medium  Result Value Ref Range Status   SARS Coronavirus 2 by RT PCR NEGATIVE NEGATIVE Final    Comment: (NOTE) SARS-CoV-2 target nucleic acids are NOT DETECTED.  The SARS-CoV-2 RNA is generally detectable in upper respiratory specimens during the acute phase of infection. The lowest concentration of SARS-CoV-2 viral copies this assay can detect is 138 copies/mL. A negative result does not preclude SARS-Cov-2 infection and should not be used as the sole basis for treatment or other patient management decisions. A negative result may occur with  improper specimen collection/handling, submission of specimen other than nasopharyngeal swab, presence of viral mutation(s) within the areas targeted by this assay, and inadequate number of viral copies(<138 copies/mL). A negative result must be combined with clinical observations, patient history, and epidemiological information. The expected result is Negative.  Fact Sheet for Patients:  BloggerCourse.com  Fact Sheet for Healthcare Providers:  SeriousBroker.it  This test is no t yet approved or cleared by the [place] FDA and  has been authorized for detection and/or diagnosis of SARS-CoV-2 by FDA under an Emergency Use Authorization (EUA). This EUA will remain  in effect (meaning this test  can be used) for the duration of the COVID-19 declaration under Section 564(b)(1) of the Act, 21 U.S.C.section 360bbb-3(b)(1), unless the authorization is terminated  or revoked sooner.       Influenza A by PCR NEGATIVE NEGATIVE Final   Influenza B by PCR NEGATIVE NEGATIVE Final    Comment: (NOTE) The Xpert Xpress SARS-CoV-2/FLU/RSV plus assay is intended as an aid in the diagnosis of influenza from Nasopharyngeal swab specimens and should not be used as a sole basis for treatment. Nasal washings and aspirates are unacceptable for Xpert Xpress SARS-CoV-2/FLU/RSV testing.  Fact Sheet for Patients: BloggerCourse.com  Fact Sheet for Healthcare Providers: SeriousBroker.it  This test is not yet approved or cleared by the [place] FDA and has been authorized for detection and/or diagnosis of SARS-CoV-2 by FDA under an Emergency Use Authorization (EUA). This EUA will remain in effect (meaning this test can be used) for the duration of the COVID-19 declaration under Section 564(b)(1) of the Act, 21 U.S.C. section 360bbb-3(b)(1), unless the authorization is terminated or revoked.  Performed at [facility], [address]  [place], [place] [address]   Surgical pcr screen     Status: None   Collection Time: 04/25/21 10:27 AM   Specimen: Nasal Mucosa; Nasal Swab  Result Value Ref Range Status   MRSA, PCR NEGATIVE NEGATIVE Final   Staphylococcus aureus NEGATIVE NEGATIVE Final    Comment: (NOTE) The Xpert SA Assay (FDA approved for NASAL specimens in patients 37 years of age and older), is one component of a comprehensive surveillance program. It is not intended to diagnose infection nor to guide or monitor treatment. Performed at [facility], [address], [place], [place] [address]      Labs: BNP (last 3 results) No results for input(s): BNP in the last 8760 hours. Basic Metabolic Panel: Recent Labs  Lab 04/23/21 0101  04/23/21 0450 04/24/21 0559 04/25/21 0413 04/27/21 0525  NA 139  --  136 136 136  K 3.7  --  4.0 4.3 3.8  CL 102  --  101 102 102  CO2 30  --  27 26 27   GLUCOSE 109*  --  128* 147* 117*  BUN 24*  --  18 17 13   CREATININE 1.02  --  0.86 0.91 0.76  CALCIUM 9.0  --  9.0 8.6* 9.0  MG  --  2.1  --   --   --   PHOS  --  2.8  --   --   --    Liver Function Tests: Recent Labs  Lab 04/23/21 0101 04/24/21 0559 04/24/21 1300 04/25/21 0413 04/27/21 0525  AST 95* 173*  169* 189* 150* 43*  ALT 72* 241*  243* 260* 277* 149*  ALKPHOS 63 91  91 97 129* 124  BILITOT 1.0 6.4*  6.4* 7.7* 2.8* 0.8  PROT 7.4 7.2  7.3 7.2 7.1 7.4  ALBUMIN 4.1 3.9  3.9 3.9 3.6 3.5   Recent Labs  Lab 04/23/21 0101 04/24/21 0559 04/25/21 0413 04/27/21 0525  LIPASE 384* 1,102* 195* 42   No results for input(s): AMMONIA in the last 168 hours. CBC: Recent Labs  Lab 04/23/21 0101 04/24/21 0559 04/25/21 0413  WBC 6.2 8.5 8.9  HGB 14.1 13.8 13.7  HCT 41.0 40.3 40.4  MCV 88.2 88.6 90.0  PLT 175 161 154   Cardiac Enzymes: No results for input(s): CKTOTAL, CKMB, CKMBINDEX, TROPONINI in the last 168 hours. BNP: Invalid input(s): POCBNP CBG: Recent Labs  Lab 04/25/21 1101 04/25/21 2332 04/26/21 0554 04/26/21 1619 04/27/21 1118  GLUCAP 110* 83 90 158* 107*   D-Dimer No results for input(s): DDIMER in the last 72 hours. Hgb A1c No results for input(s): HGBA1C in the last 72 hours. Lipid Profile No results for input(s): CHOL, HDL, LDLCALC, TRIG, CHOLHDL, LDLDIRECT in the last 72 hours. Thyroid function studies No results for input(s): TSH, T4TOTAL, T3FREE, THYROIDAB in the last 72 hours.  Invalid input(s): FREET3 Anemia work up No results for input(s): VITAMINB12, FOLATE, FERRITIN, TIBC, IRON, RETICCTPCT in the last 72 hours. Urinalysis    Component Value Date/Time   COLORURINE YELLOW 04/23/2021 0158   APPEARANCEUR HAZY (A) 04/23/2021 0158   APPEARANCEUR Clear 06/12/2015 1000    LABSPEC 1.021 04/23/2021 0158   PHURINE 7.0 04/23/2021 0158   GLUCOSEU NEGATIVE 04/23/2021 0158   HGBUR NEGATIVE 04/23/2021 0158   BILIRUBINUR NEGATIVE 04/23/2021 0158   BILIRUBINUR neg 12/13/2019 1712   BILIRUBINUR Negative 06/12/2015 1000   KETONESUR NEGATIVE 04/23/2021 0158   PROTEINUR 30 (A) 04/23/2021 0158   UROBILINOGEN 0.2 12/13/2019 1712   UROBILINOGEN 0.2 08/18/2014 0725  NITRITE NEGATIVE 04/23/2021 0158   LEUKOCYTESUR NEGATIVE 04/23/2021 0158   Sepsis Labs Invalid input(s): PROCALCITONIN,  WBC,  LACTICIDVEN Microbiology Recent Results (from the past 240 hour(s))  Resp Panel by RT-PCR (Flu A&B, Covid) Nasopharyngeal Swab     Status: None   Collection Time: 04/23/21  3:25 AM   Specimen: Nasopharyngeal Swab; Nasopharyngeal(NP) swabs in vial transport medium  Result Value Ref Range Status   SARS Coronavirus 2 by RT PCR NEGATIVE NEGATIVE Final    Comment: (NOTE) SARS-CoV-2 target nucleic acids are NOT DETECTED.  The SARS-CoV-2 RNA is generally detectable in upper respiratory specimens during the acute phase of infection. The lowest concentration of SARS-CoV-2 viral copies this assay can detect is 138 copies/mL. A negative result does not preclude SARS-Cov-2 infection and should not be used as the sole basis for treatment or other patient management decisions. A negative result may occur with  improper specimen collection/handling, submission of specimen other than nasopharyngeal swab, presence of viral mutation(s) within the areas targeted by this assay, and inadequate number of viral copies(<138 copies/mL). A negative result must be combined with clinical observations, patient history, and epidemiological information. The expected result is Negative.  Fact Sheet for Patients:  BloggerCourse.com  Fact Sheet for Healthcare Providers:  SeriousBroker.it  This test is no t yet approved or cleared by the [place] FDA  and  has been authorized for detection and/or diagnosis of SARS-CoV-2 by FDA under an Emergency Use Authorization (EUA). This EUA will remain  in effect (meaning this test can be used) for the duration of the COVID-19 declaration under Section 564(b)(1) of the Act, 21 U.S.C.section 360bbb-3(b)(1), unless the authorization is terminated  or revoked sooner.       Influenza A by PCR NEGATIVE NEGATIVE Final   Influenza B by PCR NEGATIVE NEGATIVE Final    Comment: (NOTE) The Xpert Xpress SARS-CoV-2/FLU/RSV plus assay is intended as an aid in the diagnosis of influenza from Nasopharyngeal swab specimens and should not be used as a sole basis for treatment. Nasal washings and aspirates are unacceptable for Xpert Xpress SARS-CoV-2/FLU/RSV testing.  Fact Sheet for Patients: BloggerCourse.com  Fact Sheet for Healthcare Providers: SeriousBroker.it  This test is not yet approved or cleared by the [place] FDA and has been authorized for detection and/or diagnosis of SARS-CoV-2 by FDA under an Emergency Use Authorization (EUA). This EUA will remain in effect (meaning this test can be used) for the duration of the COVID-19 declaration under Section 564(b)(1) of the Act, 21 U.S.C. section 360bbb-3(b)(1), unless the authorization is terminated or revoked.  Performed at [facility], [address] [place], [place] [address]   Surgical pcr screen     Status: None   Collection Time: 04/25/21 10:27 AM   Specimen: Nasal Mucosa; Nasal Swab  Result Value Ref Range Status   MRSA, PCR NEGATIVE NEGATIVE Final   Staphylococcus aureus NEGATIVE NEGATIVE Final    Comment: (NOTE) The Xpert SA Assay (FDA approved for NASAL specimens in patients 58 years of age and older), is one component of a comprehensive surveillance program. It is not intended to diagnose infection nor to guide or monitor treatment. Performed at [facility], [address] [place], [place] [address]     Time coordinating discharge: 37 mins  SIGNED:  Standley Dakins, MD  [facility] 04/27/2021, 3:58 PM How to contact the [facility] Attending or Consulting provider 7A - 7P or covering provider during after hours 7P -7A, for this patient?  Check the care  team in [facility] and look for a) attending/consulting [facility] provider listed and b) the [facility] team listed Log into [email or web address] and use [place]'s universal password to access. If you do not have the password, please contact the hospital operator. Locate the [facility] provider you are looking for under [facility] and page to a number that you can be directly reached. If you still have difficulty reaching the provider, please page the [facility] (Director on Call) for the Hospitalists listed on amion for assistance.

## 2021-04-27 NOTE — Progress Notes (Signed)
[facility] Progress Note ? ?1 Day Post-Op  ?Subjective: ?Patient seen and examined.  He is resting comfortably in bed.  He was able to tolerate his diet without nausea and vomiting.  He has had some loose bowel movements since the surgery.  His abdominal pain is controlled with his oral pain medications. ? ?Objective: ?Vital signs in last 24 hours: ?Temp:  [97.4 ?F (36.3 ?C)-99.1 ?F (37.3 ?C)] 97.4 ?F (36.3 ?C) (03/03 0545) ?Pulse Rate:  [63-76] 63 (03/03 0545) ?Resp:  [17-20] 18 (03/03 0545) ?BP: (120-135)/(69-81) 127/81 (03/03 0545) ?SpO2:  [95 %-99 %] 99 % (03/03 0545) ?Last BM Date : 04/27/21 ? ?Intake/Output from previous day: ?03/02 0701 - 03/03 0700 ?In: 6104.1 [P.O.:960; I.V.:5144.1] ?Out: 460 [Urine:450; Blood:10] ?Intake/Output this shift: ?No intake/output data recorded. ? ?General appearance: alert, cooperative, and no distress ?GI: Abdomen soft, nondistended, no percussion tenderness, nontender to palpation; incisions C/D/I with skin glue in place ? ?Lab Results:  ?Recent Labs  ?  04/25/21 ?0413  ?WBC 8.9  ?HGB 13.7  ?HCT 40.4  ?PLT 154  ? ?BMET ?Recent Labs  ?  04/25/21 ?0413 04/27/21 ?3009  ?NA 136 136  ?K 4.3 3.8  ?CL 102 102  ?CO2 26 27  ?GLUCOSE 147* 117*  ?BUN 17 13  ?CREATININE 0.91 0.76  ?CALCIUM 8.6* 9.0  ? ?PT/INR ?No results for input(s): LABPROT, INR in the last 72 hours. ? ?Studies/Results: ?No results found. ? ?Anti-infectives: ?Anti-infectives (From admission, onward)  ? ? Start     Dose/Rate Route Frequency Ordered Stop  ? 04/24/21 1815  cefTRIAXone (ROCEPHIN) 2 g in sodium chloride 0.9 % 100 mL IVPB       ? 2 g ?200 mL/hr over 30 Minutes Intravenous Every 24 hours 04/24/21 1720    ? ?  ? ? ?Assessment/Plan: ? ?Patient is a 46 year old male who was admitted with gallstone pancreatitis, status post ERCP with sphincterotomy on 2/28 and status post laparoscopic cholecystectomy on 3/2. ? ?-His LFTs have all improved, and his lipase has normalized ?-He is tolerating his  diet without nausea and vomiting ?-Pain is controlled with oral pain medications ?-Patient stable for discharge from general surgery standpoint ?-He will follow-up with me on March 14 ? ? LOS: 4 days  ? ? ?Harue Pribble A Armanie Ullmer ?04/27/2021 ? ?

## 2021-04-30 ENCOUNTER — Telehealth: Payer: Self-pay

## 2021-04-30 NOTE — Telephone Encounter (Signed)
Noted, will discuss at upcoming appt 

## 2021-04-30 NOTE — Telephone Encounter (Signed)
Transition Care Management Follow-up Telephone Call ?Date of discharge and from where: [facility] 04/27/21 ?How have you been since you were released from the hospital? 'fine' ?Any questions or concerns? No ? ?Items Reviewed: ?Did the pt receive and understand the discharge instructions provided? Yes  ?Medications obtained and verified? Yes  ?Other? No  ?Any new allergies since your discharge? No  ?Dietary orders reviewed? No ?Do you have support at home? Yes  ? ?Home Care and Equipment/Supplies: ?Were home health services ordered? no ? ? ?Functional Questionnaire: (I = Independent and D = Dependent) ?ADLs: I ? ?Bathing/Dressing- I ? ?Meal Prep- I ? ?Eating- I ? ?Maintaining continence- I ? ?Transferring/Ambulation- I ? ?Managing Meds- I ? ?Follow up appointments reviewed: ? ?PCP Hospital f/u appt confirmed? Yes  Scheduled to see [name] on 3/17 @ 1:20. ?[facility] f/u appt confirmed? Yes  Scheduled to see speccialist on 3/14 @ 10:45. ?Are transportation arrangements needed? No  ?If their condition worsens, is the pt aware to call PCP or go to the Emergency Dept.? Yes ?Was the patient provided with contact information for the PCP's office or ED? Yes ?Was to pt encouraged to call back with questions or concerns? Yes  ?

## 2021-05-01 ENCOUNTER — Encounter (HOSPITAL_COMMUNITY): Payer: Self-pay | Admitting: Internal Medicine

## 2021-05-08 ENCOUNTER — Encounter: Payer: Self-pay | Admitting: Surgery

## 2021-05-08 ENCOUNTER — Ambulatory Visit (INDEPENDENT_AMBULATORY_CARE_PROVIDER_SITE_OTHER): Payer: Managed Care, Other (non HMO) | Admitting: Surgery

## 2021-05-08 ENCOUNTER — Other Ambulatory Visit: Payer: Self-pay

## 2021-05-08 VITALS — BP 111/77 | HR 84 | Temp 98.2°F | Resp 16 | Ht 71.0 in | Wt 218.0 lb

## 2021-05-08 DIAGNOSIS — Z09 Encounter for follow-up examination after completed treatment for conditions other than malignant neoplasm: Secondary | ICD-10-CM

## 2021-05-08 NOTE — Progress Notes (Signed)
[facility] Note  ? ?HPI:  ?46 y.o. Male presents to clinic for post-op follow-up status post laparoscopic cholecystectomy on 3/2.  Patient was admitted to the hospital with gallstone pancreatitis and subsequently underwent cholecystectomy while inpatient. Patient reports he has been doing well since the surgery.  He denies nausea and vomiting, and is tolerating his diet.  He is moving his bowels without issue.  His only complaint is that he was having metallic taste in his mouth with associated redness and pain.  He believes that this was caused by the over-the-counter Nexium he was taking for gastritis.  It has improved since he stopped taking the Nexium. ? ?Review of Systems:  ?All other review of systems: otherwise negative  ? ?Vital Signs:  ?BP 111/77   Pulse 84   Temp 98.2 ?F (36.8 ?C) (Other (Comment))   Resp 16   Ht '5\' 11"'$  (1.803 m)   Wt 218 lb (98.9 kg)   SpO2 97%   BMI 30.40 kg/m?   ? ?Physical Exam:  ?Physical Exam ?Vitals reviewed.  ?Constitutional:   ?   Appearance: Normal appearance.  ?HENT:  ?   Mouth/Throat:  ?   Comments: No evidence of erythema or Candida ?Abdominal:  ?   Comments: Abdomen soft, nondistended, no percussion tenderness, no tenderness to palpation; incisions healing well without erythema or drainage  ?Neurological:  ?   Mental Status: He is alert.  ? ?Laboratory studies: None  ? ?Imaging: None ? ?Assessment:  ?46 y.o. yo Male who presents status post laparoscopic cholecystectomy on 3/2. ? ?Plan:  ?-Patient doing well since his laparoscopic cholecystectomy, and is tolerating a diet without significant nausea or vomiting ?-Agree with holding off on Nexium given that his oral symptoms have improved after discontinuing ?-Recommended the patient follow-up with GI regarding potential different PPI or other gastritis management ?-Follow-up with me as needed ? ?All of the above recommendations were discussed with the patient, and all of patient's questions were  answered to his expressed satisfaction. ? ?Graciella Freer, DO ?[facility] ?[place], [place] [address] ?[phone] (office) ?

## 2021-05-09 ENCOUNTER — Telehealth (INDEPENDENT_AMBULATORY_CARE_PROVIDER_SITE_OTHER): Payer: Self-pay | Admitting: Internal Medicine

## 2021-05-09 ENCOUNTER — Other Ambulatory Visit (INDEPENDENT_AMBULATORY_CARE_PROVIDER_SITE_OTHER): Payer: Self-pay | Admitting: Gastroenterology

## 2021-05-09 DIAGNOSIS — K219 Gastro-esophageal reflux disease without esophagitis: Secondary | ICD-10-CM

## 2021-05-09 DIAGNOSIS — K22719 Barrett's esophagus with dysplasia, unspecified: Secondary | ICD-10-CM

## 2021-05-09 MED ORDER — PANTOPRAZOLE SODIUM 20 MG PO TBEC: 20.0000 mg | DELAYED_RELEASE_TABLET | Freq: Every day | ORAL | 3 refills | Status: DC

## 2021-05-09 NOTE — Telephone Encounter (Signed)
Patient left voice mail message stating he was supposed to get a prescription upon being discharged - please advise - ph# [phone] ?

## 2021-05-09 NOTE — Telephone Encounter (Signed)
Patient states that he was not prescribed any PPI by the hospitalist upon discharge.  He has been taking Nexium on his own since then but developed some abdominal discomfort and black stools and has stopped it since then.  Was asking if there was any other alternative for management of his reflux and Barrett's esophagus.  I will start him on low-dose pantoprazole 20 mg every day.  He will need to follow in the clinic for further refills. ?

## 2021-05-11 ENCOUNTER — Other Ambulatory Visit: Payer: Self-pay

## 2021-05-11 ENCOUNTER — Ambulatory Visit (INDEPENDENT_AMBULATORY_CARE_PROVIDER_SITE_OTHER): Payer: Managed Care, Other (non HMO) | Admitting: Internal Medicine

## 2021-05-11 ENCOUNTER — Encounter: Payer: Self-pay | Admitting: Internal Medicine

## 2021-05-11 VITALS — BP 116/78 | HR 88 | Temp 97.3°F | Wt 222.0 lb

## 2021-05-11 DIAGNOSIS — K8001 Calculus of gallbladder with acute cholecystitis with obstruction: Secondary | ICD-10-CM | POA: Diagnosis not present

## 2021-05-11 DIAGNOSIS — Z9049 Acquired absence of other specified parts of digestive tract: Secondary | ICD-10-CM

## 2021-05-11 DIAGNOSIS — K851 Biliary acute pancreatitis without necrosis or infection: Secondary | ICD-10-CM | POA: Diagnosis not present

## 2021-05-11 NOTE — Patient Instructions (Signed)
Cholelithiasis  Cholelithiasis happens when gallstones form in the gallbladder. The gallbladder stores bile. Bile is a fluid that helps digest fats. Bile can harden and form into gallstones. If they cause a blockage, they can cause pain (gallbladder attack). What are the causes? This condition may be caused by: Some blood diseases, such as sickle cell anemia. Too much of a fat-like substance (cholesterol) in your bile. Not enough bile salts in your bile. These salts help the body absorb and digest fats. The gallbladder not emptying fully or often enough. This is common in pregnant women. What increases the risk? The following factors may make you more likely to develop this condition: Being male. Being pregnant many times. Eating a lot of fried foods, fat, and refined carbs (refined carbohydrates). Being very overweight (obese). Being older than age 40. Using medicines with male hormones in them for a long time. Losing weight fast. Having gallstones in your family. Having some health problems, such as diabetes, Crohn's disease, or liver disease. What are the signs or symptoms? Often, there may be gallstones but no symptoms. These gallstones are called silent gallstones. If a gallstone causes a blockage, you may get sudden pain. The pain: Can be in the upper right part of your belly (abdomen). Normally comes at night or after you eat. Can last an hour or more. Can spread to your right shoulder, back, or chest. Can feel like discomfort, burning, or fullness in the upper part of your belly (indigestion). If the blockage lasts more than a few hours, you can get an infection or swelling. You may: Feel like you may vomit. Vomit. Feel bloated. Have belly pain for 5 hours or more. Feel tender in your belly, often in the upper right part and under your ribs. Have fever or chills. Have skin or the white parts of your eyes turn yellow (jaundice). Have dark pee (urine) or pale poop  (stool). How is this treated? Treatment for this condition depends on how bad you feel. If you have symptoms, you may need: Home care, if symptoms are not very bad. Do not eat for 12-24 hours. Drink only water and clear liquids. Start to eat simple or clear foods after 1 or 2 days. Try broths and crackers. You may need medicines for pain or stomach upset or both. If you have an infection, you will need antibiotics. A hospital stay, if you have very bad pain or a very bad infection. Surgery to remove your gallbladder. You may need this if: Gallstones keep coming back. You have very bad symptoms. Medicines to break up gallstones. Medicines: Are best for small gallstones. May be used for up to 6-12 months. A procedure to find and take out gallstones or to break up gallstones. Follow these instructions at home: Medicines Take over-the-counter and prescription medicines only as told by your doctor. If you were prescribed an antibiotic medicine, take it as told by your doctor. Do not stop taking the antibiotic even if you start to feel better. Ask your doctor if the medicine prescribed to you requires you to avoid driving or using machinery. Eating and drinking Drink enough fluid to keep your urine pale yellow. Drink water or clear fluids. This is important when you have pain. Eat healthy foods. Choose: Fewer fatty foods, such as fried foods. Fewer refined carbs. Avoid breads and grains that are highly processed, such as white bread and white rice. Choose whole grains, such as whole-wheat bread and brown rice. More fiber. Almonds, fresh fruit, and   beans are healthy sources. General instructions Keep a healthy weight. Keep all follow-up visits as told by your doctor. This is important. Where to find more information National Institute of Diabetes and Digestive and Kidney Diseases: www.niddk.nih.gov Contact a doctor if: You have sudden pain in the upper right part of your belly. Pain might  spread to your right shoulder, back, or chest. You have been diagnosed with gallstones that have no symptoms and you get: Belly pain. Discomfort, burning, or fullness in the upper part of your abdomen. You have dark urine or pale stools. Get help right away if: You have sudden pain in the upper right part of your abdomen, and the pain lasts more than 2 hours. You have pain in your abdomen, and: It lasts more than 5 hours. It keeps getting worse. You have a fever or chills. You keep feeling like you may vomit. You keep vomiting. Your skin or the white parts of your eyes turn yellow. Summary Cholelithiasis happens when gallstones form in the gallbladder. This condition may be caused by a blood disease, too much of a fat-like substance in the bile, or not enough bile salts in bile. Treatment for this condition depends on how bad you feel. If you have symptoms, do not eat or drink. You may need medicines. You may need a hospital stay for very bad pain or a very bad infection. You may need surgery if gallstones keep coming back or if you have very bad symptoms. This information is not intended to replace advice given to you by your health care provider. Make sure you discuss any questions you have with your healthcare provider. Document Revised: 04/02/2019 Document Reviewed: 01/04/2019 Elsevier Patient Education  2022 Elsevier Inc.     

## 2021-05-11 NOTE — Progress Notes (Signed)
? ?Subjective:  ? ? Patient ID: Dale Davis, male    DOB: [birth year], 46 y.o.   MRN: [id number] ? ?HPI ? ?Patient presents to clinic today for ER follow-up.  He presented to the ER 2/27 with complaint of abdominal pain, nausea, vomiting.  Lipase and liver enzymes were elevated.  He was treated with IV fluids, Zofran, GI cocktail and pain medication.  He underwent ERCP on 2/28 which showed sludge in the common bile duct.  He underwent a lap cholecystectomy on 3/2.  He was discharged on 3/3.  Since that time, he reports his symptoms have resolved. He is taking the Pantoprazole as prescribed. He has already followed up with the surgeon. ? ?Review of Systems ? ?   ?Past Medical History:  ?Diagnosis Date  ? Anxiety   ? Depression   ? Kidney stones   ? Kidney stones   ? ? ?Current Outpatient Medications  ?Medication Sig Dispense Refill  ? Armodafinil 150 MG tablet TAKE 1/2 TABLET (75 MG TOTAL) BY MOUTH DAILY. (Patient taking differently: Take 75 mg by mouth daily.) 15 tablet 4  ? docusate sodium (COLACE) 100 MG capsule Take 1 capsule (100 mg total) by mouth 2 (two) times daily. 60 capsule 0  ? hydrOXYzine (VISTARIL) 25 MG capsule TAKE 1-2 CAPSULES BY MOUTH AT BEDTIME AS NEEDED. FOR SEVERE ANXIETY , RACING THOUGHTS, SLEEP (Patient taking differently: Take 25-50 mg by mouth at bedtime as needed for anxiety.) 180 capsule 1  ? pantoprazole (PROTONIX) 20 MG tablet Take 1 tablet (20 mg total) by mouth daily. 90 tablet 3  ? ?No current facility-administered medications for this visit.  ? ? ?Allergies  ?Allergen Reactions  ? Flomax [Tamsulosin Hcl] Rash  ? ? ?Family History  ?Problem Relation Age of Onset  ? Kidney failure Father   ? ? ?Social History  ? ?Socioeconomic History  ? Marital status: Married  ?  Spouse name: [name]  ? Number of children: 0  ? Years of education: Not on file  ? Highest education level: Not on file  ?Occupational History  ? Occupation: Entrepeneur  ?  Employer: Building [employer]   ?Tobacco Use  ? Smoking status: Never  ? Smokeless tobacco: Former  ?  Types: Chew  ?  Quit date: 06/2020  ?Vaping Use  ? Vaping Use: Never used  ?Substance and Sexual Activity  ? Alcohol use: Yes  ?  Alcohol/week: 1.0 standard drink  ?  Types: 1 Standard drinks or equivalent per week  ?  Comment: occassional  ? Drug use: No  ? Sexual activity: Yes  ?Other Topics Concern  ? Not on file  ?Social History Narrative  ? Not on file  ? ?Social Determinants of Health  ? ?Financial Resource Strain: Not on file  ?Food Insecurity: Not on file  ?Transportation Needs: Not on file  ?Physical Activity: Not on file  ?Stress: Not on file  ?Social Connections: Not on file  ?Intimate Partner Violence: Not on file  ? ? ? ?Constitutional: Denies fever, malaise, fatigue, headache or abrupt weight changes.  ?Respiratory: Denies difficulty breathing, shortness of breath, cough or sputum production.   ?Cardiovascular: Denies chest pain, chest tightness, palpitations or swelling in the hands or feet.  ?Gastrointestinal: Denies abdominal pain, bloating, constipation, diarrhea or blood in the stool.  ?Skin: Pt reports scabbed lab sites to abdomen. Denies redness, rashes, lesions or ulcercations.  ? ? ?No other specific complaints in a complete review of systems (except as listed  in HPI above). ? ?Objective:  ? Physical Exam ? ?BP 116/78 (BP Location: Right Arm, Patient Position: Sitting, Cuff Size: Large)   Pulse 88   Temp (!) 97.3 ?F (36.3 ?C) (Temporal)   Wt 222 lb (100.7 kg)   SpO2 96%   BMI 30.96 kg/m?  ? ?Wt Readings from Last 3 Encounters:  ?05/08/21 218 lb (98.9 kg)  ?04/26/21 217 lb (98.4 kg)  ?02/02/21 223 lb 12.8 oz (101.5 kg)  ? ? ?General: Appears his stated age, obese, in NAD. ?Skin: 4 lap sites scabbed to abdomen, healing well. ?Cardiovascular: Normal rate and rhythm. S1,S2 noted.  No murmur, rubs or gallops noted.  ?Pulmonary/Chest: Normal effort and positive vesicular breath sounds. No respiratory distress. No wheezes,  rales or ronchi noted.  ?Abdomen: Soft and nontender. Normal bowel sounds. No distention or masses noted.  ?Neurological: Alert and oriented.  ? ?BMET ?   ?Component Value Date/Time  ? NA 136 04/27/2021 0525  ? NA 142 05/26/2020 0814  ? K 3.8 04/27/2021 0525  ? CL 102 04/27/2021 0525  ? CO2 27 04/27/2021 0525  ? GLUCOSE 117 (H) 04/27/2021 0525  ? BUN 13 04/27/2021 0525  ? BUN 24 05/26/2020 0814  ? CREATININE 0.76 04/27/2021 0525  ? CREATININE 1.08 02/11/2020 1505  ? CALCIUM 9.0 04/27/2021 0525  ? GFRNONAA >60 04/27/2021 0525  ? GFRAA 89 07/09/2018 0836  ? ? ?Lipid Panel  ?   ?Component Value Date/Time  ? CHOL 261 (H) 04/23/2021 0325  ? CHOL 169 05/26/2020 0814  ? TRIG 91 04/24/2021 0559  ? HDL 40 (L) 04/23/2021 0325  ? HDL 37 (L) 05/26/2020 0814  ? CHOLHDL 6.5 04/23/2021 0325  ? VLDL 28 04/23/2021 0325  ? LDLCALC 193 (H) 04/23/2021 0325  ? [place] 115 (H) 05/26/2020 0272  ? LDLCALC 196 (H) 02/11/2020 1505  ? ? ?CBC ?   ?Component Value Date/Time  ? WBC 8.9 04/25/2021 0413  ? RBC 4.49 04/25/2021 0413  ? HGB 13.7 04/25/2021 0413  ? HGB 14.0 07/09/2018 0836  ? HCT 40.4 04/25/2021 0413  ? HCT 41.0 07/09/2018 0836  ? PLT 154 04/25/2021 0413  ? PLT 174 07/09/2018 0836  ? MCV 90.0 04/25/2021 0413  ? MCV 91 07/09/2018 0836  ? MCH 30.5 04/25/2021 0413  ? MCHC 33.9 04/25/2021 0413  ? RDW 12.5 04/25/2021 0413  ? RDW 13.2 07/09/2018 0836  ? LYMPHSABS 0.8 07/09/2018 0836  ? MONOABS 0.8 04/18/2015 1930  ? EOSABS 0.1 07/09/2018 0836  ? BASOSABS 0.0 07/09/2018 0836  ? ? ?Hgb A1C ?Lab Results  ?Component Value Date  ? HGBA1C 5.4 02/11/2020  ? ? ? ? ? ?   ?Assessment & Plan:  ? ?Hospital Follow-up for Acute Gallstone Pancreatitis secondary to Cholelithiasis status post Cholecystectomy: ? ?ER notes, labs and imaging reviewed ?Continue Pantoprazole ?Lap sites healing well ?He has already followed up with his surgeon ? ? ?RTC in 3 months, follow up chronic conditions ?[name], NP ?This visit occurred during the SARS-CoV-2 public  health emergency.  Safety protocols were in place, including screening questions prior to the visit, additional usage of staff PPE, and extensive cleaning of exam room while observing appropriate contact time as indicated for disinfecting solutions.  ? ?

## 2021-06-11 ENCOUNTER — Encounter: Payer: Self-pay | Admitting: Internal Medicine

## 2021-06-11 ENCOUNTER — Telehealth (INDEPENDENT_AMBULATORY_CARE_PROVIDER_SITE_OTHER): Payer: Managed Care, Other (non HMO) | Admitting: Internal Medicine

## 2021-06-11 DIAGNOSIS — J00 Acute nasopharyngitis [common cold]: Secondary | ICD-10-CM

## 2021-06-11 MED ORDER — HYDROCODONE BIT-HOMATROP MBR 5-1.5 MG/5ML PO SOLN: 5.0000 mL | Freq: Three times a day (TID) | ORAL | 0 refills | Status: DC | PRN

## 2021-06-11 MED ORDER — AZITHROMYCIN 250 MG PO TABS: ORAL_TABLET | ORAL | 0 refills | Status: AC

## 2021-06-11 NOTE — Progress Notes (Signed)
Virtual Visit via Video Note ? ?I connected with Dale Davis on 06/11/21 at  1:40 PM EDT by a video enabled telemedicine application and verified that I am speaking with the correct person using two identifiers. ? ?Location: ?Patient: Work ?Provider: Office ? ?Person's participating in this video call: Dale Silversmith, NP and Dale Davis ?  ?I discussed the limitations of evaluation and management by telemedicine and the availability of in person appointments. The patient expressed understanding and agreed to proceed. ? ?History of Present Illness: ? ?Pt reports nasal congestion, cough and chest congestion. This started 1 week ago. He is not blowing anything out of his nose. The cough is mostly non productive. He denies headache, runny nose, ear pain, sore throat, shortness of Davis, fever, chills or body aches. He has not had sick contacts that he is aware of but did recently get back from a cruise. He tested negative for covid. He has been taking Advil Cold and Sinus OTC with minimal relief of symptoms.  ? ? ?Past Medical History:  ?Diagnosis Date  ? Anxiety   ? Depression   ? Kidney stones   ? Kidney stones   ? ? ?Current Outpatient Medications  ?Medication Sig Dispense Refill  ? Armodafinil 150 MG tablet TAKE 1/2 TABLET (75 MG TOTAL) BY MOUTH DAILY. (Patient taking differently: Take 75 mg by mouth daily.) 15 tablet 4  ? hydrOXYzine (VISTARIL) 25 MG capsule TAKE 1-2 CAPSULES BY MOUTH AT BEDTIME AS NEEDED. FOR SEVERE ANXIETY , RACING THOUGHTS, SLEEP (Patient taking differently: Take 25-50 mg by mouth at bedtime as needed for anxiety.) 180 capsule 1  ? pantoprazole (PROTONIX) 20 MG tablet Take 1 tablet (20 mg total) by mouth daily. 90 tablet 3  ? ?No current facility-administered medications for this visit.  ? ? ?Allergies  ?Allergen Reactions  ? Flomax [Tamsulosin Hcl] Rash  ? ? ?Family History  ?Problem Relation Age of Onset  ? Kidney failure Father   ? ? ?Social History  ? ?Socioeconomic History  ? Marital  status: Married  ?  Spouse name: Dale Davis  ? Number of children: 0  ? Years of education: Not on file  ? Highest education level: Not on file  ?Occupational History  ? Occupation: Entrepeneur  ?  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 Smoking status: Never  ? Smokeless tobacco: Former  ?  Types: Chew  ?  Quit date: 06/2020  ?Vaping Use  ? Vaping Use: Never used  ?Substance and Sexual Activity  ? Alcohol use: Yes  ?  Alcohol/week: 1.0 standard drink  ?  Types: 1 Standard drinks or equivalent per week  ?  Comment: occassional  ? Drug use: No  ? Sexual activity: Yes  ?Other Topics Concern  ? Not on file  ?Social History Narrative  ? Not on file  ? ?Social Determinants of Health  ? ?Financial Resource Strain: Not on file  ?Food Insecurity: Not on file  ?Transportation Needs: Not on file  ?Physical Activity: Not on file  ?Stress: Not on file  ?Social Connections: Not on file  ?Intimate Partner Violence: Not on file  ? ? ? ?Constitutional: Denies fever, malaise, fatigue, headache or abrupt weight changes.  ?HEENT: Pt reports nasal congestion. Denies eye pain, eye redness, ear pain, ringing in the ears, wax buildup, runny nose, bloody nose, or sore throat. ?Respiratory: Pt reports cough. Denies difficulty breathing, shortness of Davis.   ?Cardiovascular: Denies chest pain, chest tightness, palpitations or swelling  in the hands or feet.  ?Gastrointestinal: Denies abdominal pain, bloating, constipation, diarrhea or blood in the stool.  ? ?No other specific complaints in a complete review of systems (except as listed in HPI above). ? ?  ?Observations/Objective: ? ? ?Wt Readings from Last 3 Encounters:  ?05/11/21 222 lb (100.7 kg)  ?05/08/21 218 lb (98.9 kg)  ?04/26/21 217 lb (98.4 kg)  ? ? ?General: Appears his stated age, well developed, well nourished in NAD. ?HEENT:  Nose: congestion noted; Throat/Mouth: no hoarseness noted ?Pulmonary/Chest: Normal effort. No respiratory distress. ?Neurological: Alert  and oriented.  ? ?BMET ?   ?Component Value Date/Time  ? NA 136 04/27/2021 0525  ? NA 142 05/26/2020 0814  ? K 3.8 04/27/2021 0525  ? CL 102 04/27/2021 0525  ? CO2 27 04/27/2021 0525  ? GLUCOSE 117 (H) 04/27/2021 0525  ? BUN 13 04/27/2021 0525  ? BUN 24 05/26/2020 0814  ? CREATININE 0.76 04/27/2021 0525  ? CREATININE 1.08 02/11/2020 1505  ? CALCIUM 9.0 04/27/2021 0525  ? GFRNONAA >60 04/27/2021 0525  ? GFRAA 89 07/09/2018 0836  ? ? ?Lipid Panel  ?   ?Component Value Date/Time  ? CHOL 261 (H) 04/23/2021 0325  ? CHOL 169 05/26/2020 0814  ? TRIG 91 04/24/2021 0559  ? HDL 40 (L) 04/23/2021 0325  ? HDL 37 (L) 05/26/2020 0814  ? CHOLHDL 6.5 04/23/2021 0325  ? VLDL 28 04/23/2021 0325  ? LDLCALC 193 (H) 04/23/2021 0325  ? [place] 115 (H) 05/26/2020 1448  ? LDLCALC 196 (H) 02/11/2020 1505  ? ? ?CBC ?   ?Component Value Date/Time  ? WBC 8.9 04/25/2021 0413  ? RBC 4.49 04/25/2021 0413  ? HGB 13.7 04/25/2021 0413  ? HGB 14.0 07/09/2018 0836  ? HCT 40.4 04/25/2021 0413  ? HCT 41.0 07/09/2018 0836  ? PLT 154 04/25/2021 0413  ? PLT 174 07/09/2018 0836  ? MCV 90.0 04/25/2021 0413  ? MCV 91 07/09/2018 0836  ? MCH 30.5 04/25/2021 0413  ? MCHC 33.9 04/25/2021 0413  ? RDW 12.5 04/25/2021 0413  ? RDW 13.2 07/09/2018 0836  ? LYMPHSABS 0.8 07/09/2018 0836  ? MONOABS 0.8 04/18/2015 1930  ? EOSABS 0.1 07/09/2018 0836  ? BASOSABS 0.0 07/09/2018 0836  ? ? ?Hgb A1C ?Lab Results  ?Component Value Date  ? HGBA1C 5.4 02/11/2020  ? ? ? ? ? ? ?Assessment and Plan: ? ?URI with Cough: ? 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 ?Return precautions discussed ?Follow Up Instructions: ? ?  ?I discussed the assessment and treatment plan with the patient. The patient was provided an opportunity to ask questions and all were answered. The patient agreed with the plan and demonstrated an understanding of the  instructions. ?  ?The patient was advised to call back or seek an in-person evaluation if the symptoms worsen or if the condition fails to improve as anticipated. ? ? ? ?Dale Silversmith, NP ? ?

## 2021-06-11 NOTE — Patient Instructions (Signed)

## 2021-06-12 ENCOUNTER — Telehealth: Payer: Managed Care, Other (non HMO) | Admitting: Internal Medicine

## 2021-06-14 ENCOUNTER — Telehealth: Payer: 59 | Admitting: Psychiatry

## 2021-06-14 ENCOUNTER — Encounter: Payer: Self-pay | Admitting: Psychiatry

## 2021-06-14 DIAGNOSIS — G4726 Circadian rhythm sleep disorder, shift work type: Secondary | ICD-10-CM | POA: Diagnosis not present

## 2021-06-14 DIAGNOSIS — F3178 Bipolar disorder, in full remission, most recent episode mixed: Secondary | ICD-10-CM | POA: Diagnosis not present

## 2021-06-14 NOTE — Progress Notes (Signed)
Virtual Visit via Video Note ? ?I connected with Dale Davis on 06/14/21 at  1:00 PM EDT by a video enabled telemedicine application and verified that I am speaking with the correct person using two identifiers. ? ?Location ?Provider Location : [facility] ?Patient Location : Work ? ?Participants: Patient , Provider ?  ?I discussed the limitations of evaluation and management by telemedicine and the availability of in person appointments. The patient expressed understanding and agreed to proceed. ?  ?I discussed the assessment and treatment plan with the patient. The patient was provided an opportunity to ask questions and all were answered. The patient agreed with the plan and demonstrated an understanding of the instructions. ?  ?The patient was advised to call back or seek an in-person evaluation if the symptoms worsen or if the condition fails to improve as anticipated. ? ?[place] MD OP Progress Note ? ?06/14/2021 1:14 PM ?Dale Davis  ?MRN:  [id number] ? ?Chief Complaint:  ?Chief Complaint  ?Patient presents with  ? Follow-up : 46 year old Caucasian male, with history of bipolar disorder, shiftwork sleep disorder, presented for medication management.  ? ?HPI: Dale Davis is a 46 year old Caucasian male, married, employed, lives in [place], has a history of bipolar disorder, shift work sleep disorder, insomnia was evaluated by telemedicine today. ? ?Patient today reports he currently does not have any mood swings.  Denies any depression or hypomanic or manic symptoms. ? ?Reports sleep is interrupted since the past few days since he has upper respiratory tract infection symptoms and is currently on Z-Pak.  Reports he continues to have congestion which could be causing the interrupted sleep.  Prior to that he was sleeping good. ? ?Patient continues to take armodafinil in the morning, half to quarter tablet of the 150 mg.  That helps. ? ?Patient is currently not on a mood stabilizer, has not used the olanzapine  as needed at all. ? ?Patient denies any suicidality, homicidality or perceptual disturbances. ? ?Patient denies any other concerns today. ? ?Visit Diagnosis:  ?  ICD-10-CM   ?1. Bipolar disorder, in full remission, most recent episode mixed ([place])  F31.78   ?  ?2. 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  ? ? ?Past Psychiatric History: Reviewed past psychiatric history from progress note on 06/26/2018.  Past trials of medications ?Symbyax ?Trazodone ?Klonopin ?Wellbutrin ?Lunesta ?Adderall ?Chantix ?Propranolol ?Cogentin ?Prozac ?Zyprexa ? ?Past Medical History:  ?Past Medical History:  ?Diagnosis Date  ? Anxiety   ? Depression   ? Kidney stones   ? Kidney stones   ?  ?Past Surgical History:  ?Procedure Laterality Date  ? CHOLECYSTECTOMY N/A 04/26/2021  ? Procedure: LAPAROSCOPIC CHOLECYSTECTOMY;  Surgeon: Rusty Aus, DO;  Location: [facility];  Service: General;  Laterality: N/A;  ? ERCP N/A 04/24/2021  ? Procedure: ENDOSCOPIC RETROGRADE CHOLANGIOPANCREATOGRAPHY (ERCP);  Surgeon: Rogene Houston, MD;  Location: [facility];  Service: Gastroenterology;  Laterality: N/A;  ? ESOPHAGEAL DILATION N/A 04/24/2021  ? Procedure: ESOPHAGEAL DILATION;  Surgeon: Rogene Houston, MD;  Location: [facility];  Service: Gastroenterology;  Laterality: N/A;  ? ESOPHAGOGASTRODUODENOSCOPY (EGD) WITH PROPOFOL N/A 04/24/2021  ? Procedure: ESOPHAGOGASTRODUODENOSCOPY (EGD) WITH PROPOFOL;  Surgeon: Rogene Houston, MD;  Location: [facility];  Service: Gastroenterology;  Laterality: N/A;  ? TOE AMPUTATION Right 03/29/2015  ? big toe  ? ? ?Family Psychiatric History: Reviewed family psychiatric history from progress note on 06/26/2018. ? ?Family History:  ?Family History  ?Problem Relation Age of Onset  ?  Kidney failure Father   ? ? ?Social History: Reviewed social history from progress note on 06/26/2018. ?Social History  ? ?Socioeconomic History  ? Marital status: Married  ?  Spouse name: [name]  ? Number of children: 0  ? Years of education: Not on file  ?  Highest education level: Not on file  ?Occupational History  ? Occupation: Entrepeneur  ?  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 Smoking status: Never  ? Smokeless tobacco: Former  ?  Types: Chew  ?  Quit date: 06/2020  ?Vaping Use  ? Vaping Use: Never used  ?Substance and Sexual Activity  ? Alcohol use: Yes  ?  Alcohol/week: 1.0 standard drink  ?  Types: 1 Standard drinks or equivalent per week  ?  Comment: occassional  ? Drug use: No  ? Sexual activity: Yes  ?Other Topics Concern  ? Not on file  ?Social History Narrative  ? Not on file  ? ?Social Determinants of Health  ? ?Financial Resource Strain: Not on file  ?Food Insecurity: Not on file  ?Transportation Needs: Not on file  ?Physical Activity: Not on file  ?Stress: Not on file  ?Social Connections: Not on file  ? ? ?Allergies:  ?Allergies  ?Allergen Reactions  ? Flomax [Tamsulosin Hcl] Rash  ? ? ?Metabolic Disorder Labs: ?Lab Results  ?Component Value Date  ? HGBA1C 5.4 02/11/2020  ? MPG 108 02/11/2020  ? ?Lab Results  ?Component Value Date  ? PROLACTIN 10.7 06/06/2020  ? PROLACTIN 6.5 07/15/2019  ? ?Lab Results  ?Component Value Date  ? CHOL 261 (H) 04/23/2021  ? TRIG 91 04/24/2021  ? HDL 40 (L) 04/23/2021  ? CHOLHDL 6.5 04/23/2021  ? VLDL 28 04/23/2021  ? LDLCALC 193 (H) 04/23/2021  ? LDLCALC 115 (H) 05/26/2020  ? ?Lab Results  ?Component Value Date  ? TSH 1.49 06/24/2019  ? TSH 1.830 07/09/2018  ? ? ?Therapeutic Level Labs: ?No results found for: LITHIUM ?No results found for: VALPROATE ?No components found for:  CBMZ ? ?Current Medications: ?Current Outpatient Medications  ?Medication Sig Dispense Refill  ? Armodafinil 150 MG tablet TAKE 1/2 TABLET (75 MG TOTAL) BY MOUTH DAILY. (Patient taking differently: Take 75 mg by mouth daily.) 15 tablet 4  ? azithromycin (ZITHROMAX) 250 MG tablet Take 2 tablets on day 1, then 1 tablet daily on days 2 through 5 6 tablet 0  ? HYDROcodone bit-homatropine (HYCODAN) 5-1.5 MG/5ML syrup Take 5 mLs by  mouth every 8 (eight) hours as needed for cough. 120 mL 0  ? hydrOXYzine (VISTARIL) 25 MG capsule TAKE 1-2 CAPSULES BY MOUTH AT BEDTIME AS NEEDED. FOR SEVERE ANXIETY , RACING THOUGHTS, SLEEP (Patient taking differently: Take 25-50 mg by mouth at bedtime as needed for anxiety.) 180 capsule 1  ? pantoprazole (PROTONIX) 20 MG tablet Take 1 tablet (20 mg total) by mouth daily. 90 tablet 3  ? ?No current facility-administered medications for this visit.  ? ? ? ?Musculoskeletal: ?Strength & Muscle Tone:  UTA ?Gait & Station:  Seated ?Patient leans: N/A ? ?Psychiatric Specialty Exam: ?Review of Systems  ?HENT:  Positive for congestion.   ?Psychiatric/Behavioral:  Positive for sleep disturbance.   ?All other systems reviewed and are negative.  ?There were no vitals taken for this visit.There is no height or weight on file to calculate BMI.  ?General Appearance: Casual  ?Eye Contact:  Fair  ?Speech:  Clear and Coherent  ?Volume:  Normal  ?Mood:  Euthymic  ?  Affect:  Congruent  ?Thought Process:  Goal Directed and Descriptions of Associations: Intact  ?Orientation:  Full (Time, Place, and Person)  ?Thought Content: Logical   ?Suicidal Thoughts:  No  ?Homicidal Thoughts:  No  ?Memory:  Immediate;   Fair ?Recent;   Fair ?Remote;   Fair  ?Judgement:  Fair  ?Insight:  Fair  ?Psychomotor Activity:  Normal  ?Concentration:  Concentration: Fair and Attention Span: Fair  ?Recall:  Fair  ?Fund of Knowledge: Fair  ?Language: Fair  ?Akathisia:  No  ?Handed:  Right  ?AIMS (if indicated): not done  ?Assets:  Communication Skills ?Desire for Improvement ?Housing ?Intimacy ?Social Support ?Talents/Skills ?Transportation  ?ADL's:  Intact  ?Cognition: WNL  ?Sleep:   Restless due to upper respiratory tract infection symptoms  ? ?Screenings: ?AIMS   ? ?[place] Office Visit from 08/16/2020 in [place]  ?AIMS Total Score 0  ? ?  ? ?GAD-7   ? ?[place] Office Visit from 05/11/2021 in [facility] Video Visit from 12/13/2020 in [place] Office Visit from 08/16/2020 in [place]  ?Total GAD-7 Score 0 0 0  ? ?  ? ?PHQ2-9   ? ?[place]

## 2021-06-26 ENCOUNTER — Ambulatory Visit (INDEPENDENT_AMBULATORY_CARE_PROVIDER_SITE_OTHER): Payer: Managed Care, Other (non HMO) | Admitting: Gastroenterology

## 2021-07-05 ENCOUNTER — Telehealth: Payer: Self-pay

## 2021-07-05 NOTE — Telephone Encounter (Signed)
received fax that a prior auth was needed for the armodafinil ?

## 2021-07-05 NOTE — Telephone Encounter (Signed)
went online to [email or web address] and submitted the prior auth . - pending 

## 2021-07-06 NOTE — Telephone Encounter (Signed)
prior auth for the armodafinil was approved through 01-05-22 [id number] ?

## 2021-08-02 ENCOUNTER — Ambulatory Visit (INDEPENDENT_AMBULATORY_CARE_PROVIDER_SITE_OTHER): Payer: Managed Care, Other (non HMO) | Admitting: Gastroenterology

## 2021-08-16 ENCOUNTER — Encounter: Payer: Self-pay | Admitting: Internal Medicine

## 2021-08-16 ENCOUNTER — Ambulatory Visit: Payer: Managed Care, Other (non HMO) | Admitting: Internal Medicine

## 2021-08-16 DIAGNOSIS — F3178 Bipolar disorder, in full remission, most recent episode mixed: Secondary | ICD-10-CM | POA: Diagnosis not present

## 2021-08-16 DIAGNOSIS — K219 Gastro-esophageal reflux disease without esophagitis: Secondary | ICD-10-CM | POA: Insufficient documentation

## 2021-08-16 DIAGNOSIS — E663 Overweight: Secondary | ICD-10-CM

## 2021-08-16 DIAGNOSIS — N522 Drug-induced erectile dysfunction: Secondary | ICD-10-CM

## 2021-08-16 DIAGNOSIS — G4726 Circadian rhythm sleep disorder, shift work type: Secondary | ICD-10-CM

## 2021-08-16 DIAGNOSIS — Z6829 Body mass index (BMI) 29.0-29.9, adult: Secondary | ICD-10-CM

## 2021-08-16 DIAGNOSIS — E782 Mixed hyperlipidemia: Secondary | ICD-10-CM

## 2021-08-16 NOTE — Assessment & Plan Note (Signed)
Currently not an issue He is not taking any medications for this We will monitor

## 2021-08-16 NOTE — Assessment & Plan Note (Signed)
Encourage diet and exercise for weight loss 

## 2021-08-16 NOTE — Assessment & Plan Note (Signed)
He is having his lipid profile done at his biometric screening next week, he will bring me a copy of this Encouraged him to consume a low-fat diet

## 2021-08-16 NOTE — Patient Instructions (Signed)
Heart-Healthy Eating Plan Many factors influence your heart (coronary) health, including eating and exercise habits. Coronary risk increases with abnormal blood fat (lipid) levels. Heart-healthy meal planning includes limiting unhealthy fats, increasing healthy fats, and making other diet and lifestyle changes. What is my plan? Your health care provider may recommend that you: Limit your fat intake to _________% or less of your total calories each day. Limit your saturated fat intake to _________% or less of your total calories each day. Limit the amount of cholesterol in your diet to less than _________ mg per day. What are tips for following this plan? Cooking Cook foods using methods other than frying. Baking, boiling, grilling, and broiling are all good options. Other ways to reduce fat include: Removing the skin from poultry. Removing all visible fats from meats. Steaming vegetables in water or broth. Meal planning  At meals, imagine dividing your plate into fourths: Fill one-half of your plate with vegetables and green salads. Fill one-fourth of your plate with whole grains. Fill one-fourth of your plate with lean protein foods. Eat 4-5 servings of vegetables per day. One serving equals 1 cup raw or cooked vegetable, or 2 cups raw leafy greens. Eat 4-5 servings of fruit per day. One serving equals 1 medium whole fruit,  cup dried fruit,  cup fresh, frozen, or canned fruit, or  cup 100% fruit juice. Eat more foods that contain soluble fiber. Examples include apples, broccoli, carrots, beans, peas, and barley. Aim to get 25-30 g of fiber per day. Increase your consumption of legumes, nuts, and seeds to 4-5 servings per week. One serving of dried beans or legumes equals  cup cooked, 1 serving of nuts is  cup, and 1 serving of seeds equals 1 tablespoon. Fats Choose healthy fats more often. Choose monounsaturated and polyunsaturated fats, such as olive and canola oils, flaxseeds,  walnuts, almonds, and seeds. Eat more omega-3 fats. Choose salmon, mackerel, sardines, tuna, flaxseed oil, and ground flaxseeds. Aim to eat fish at least 2 times each week. Check food labels carefully to identify foods with trans fats or high amounts of saturated fat. Limit saturated fats. These are found in animal products, such as meats, butter, and cream. Plant sources of saturated fats include palm oil, palm kernel oil, and coconut oil. Avoid foods with partially hydrogenated oils in them. These contain trans fats. Examples are stick margarine, some tub margarines, cookies, crackers, and other baked goods. Avoid fried foods. General information Eat more home-cooked food and less restaurant, buffet, and fast food. Limit or avoid alcohol. Limit foods that are high in starch and sugar. Lose weight if you are overweight. Losing just 5-10% of your body weight can help your overall health and prevent diseases such as diabetes and heart disease. Monitor your salt (sodium) intake, especially if you have high blood pressure. Talk with your health care provider about your sodium intake. Try to incorporate more vegetarian meals weekly. What foods can I eat? Fruits All fresh, canned (in natural juice), or frozen fruits. Vegetables Fresh or frozen vegetables (raw, steamed, roasted, or grilled). Green salads. Grains Most grains. Choose whole wheat and whole grains most of the time. Rice and pasta, including brown rice and pastas made with whole wheat. Meats and other proteins Lean, well-trimmed beef, veal, pork, and lamb. Chicken and turkey without skin. All fish and shellfish. Wild duck, rabbit, pheasant, and venison. Egg whites or low-cholesterol egg substitutes. Dried beans, peas, lentils, and tofu. Seeds and most nuts. Dairy Low-fat or nonfat cheeses,   including ricotta and mozzarella. Skim or 1% milk (liquid, powdered, or evaporated). Buttermilk made with low-fat milk. Nonfat or low-fat  yogurt. Fats and oils Non-hydrogenated (trans-free) margarines. Vegetable oils, including soybean, sesame, sunflower, olive, peanut, safflower, corn, canola, and cottonseed. Salad dressings or mayonnaise made with a vegetable oil. Beverages Water (mineral or sparkling). Coffee and tea. Diet carbonated beverages. Sweets and desserts Sherbet, gelatin, and fruit ice. Small amounts of dark chocolate. Limit all sweets and desserts. Seasonings and condiments All seasonings and condiments. The items listed above may not be a complete list of foods and beverages you can eat. Contact a dietitian for more options. What foods are not recommended? Fruits Canned fruit in heavy syrup. Fruit in cream or butter sauce. Fried fruit. Limit coconut. Vegetables Vegetables cooked in cheese, cream, or butter sauce. Fried vegetables. Grains Breads made with saturated or trans fats, oils, or whole milk. Croissants. Sweet rolls. Donuts. High-fat crackers, such as cheese crackers. Meats and other proteins Fatty meats, such as hot dogs, ribs, sausage, bacon, rib-eye roast or steak. High-fat deli meats, such as salami and bologna. Caviar. Domestic duck and goose. Organ meats, such as liver. Dairy Cream, sour cream, cream cheese, and creamed cottage cheese. Whole-milk cheeses. Whole or 2% milk (liquid, evaporated, or condensed). Whole buttermilk. Cream sauce or high-fat cheese sauce. Whole-milk yogurt. Fats and oils Meat fat, or shortening. Cocoa butter, hydrogenated oils, palm oil, coconut oil, palm kernel oil. Solid fats and shortenings, including bacon fat, salt pork, lard, and butter. Nondairy cream substitutes. Salad dressings with cheese or sour cream. Beverages Regular sodas and any drinks with added sugar. Sweets and desserts Frosting. Pudding. Cookies. Cakes. Pies. Milk chocolate or white chocolate. Buttered syrups. Full-fat ice cream or ice cream drinks. The items listed above may not be a complete list of  foods and beverages to avoid. Contact a dietitian for more information. Summary Heart-healthy meal planning includes limiting unhealthy fats, increasing healthy fats, and making other diet and lifestyle changes. Lose weight if you are overweight. Losing just 5-10% of your body weight can help your overall health and prevent diseases such as diabetes and heart disease. Focus on eating a balance of foods, including fruits and vegetables, low-fat or nonfat dairy, lean protein, nuts and legumes, whole grains, and heart-healthy oils and fats. This information is not intended to replace advice given to you by your health care provider. Make sure you discuss any questions you have with your health care provider. Document Revised: 06/22/2020 Document Reviewed: 06/22/2020 Elsevier Patient Education  2022 Elsevier Inc.  

## 2021-08-16 NOTE — Assessment & Plan Note (Signed)
Stable on her current his current dose of armodafinil and hydroxyzine He will continue to follow with psychiatry

## 2021-08-16 NOTE — Progress Notes (Signed)
Subjective:    Patient ID: Dale Davis, male    DOB: 09/05/1975, 46 y.o.   MRN: [id number]  HPI  Patient presents to clinic today for follow-up of chronic conditions.  Bipolar Depression: Chronic, managed on Hydroxyzine and Armodafinil.  He follows with psychiatry but is not currently seeing a therapist.  He denies SI/HI.  Shiftwork Sleep disorder: He has trouble staying asleep.  He is not currently taking any medications for this.  There is no sleep study on file.  ED: He is not currently taking any medications for this at this time.  He does not follow with urology.  HLD: His last LDL was 193, triglycerides 141, 04/2021.  He is not taking Rosuvastatin.  He does not consume a low-fat diet.  GERD: He is not sure what triggers. He is not taking Pantoprazole. Upper GI from 04/2021 reviewed.  Review of Systems     Past Medical History:  Diagnosis Date   Anxiety    Depression    Kidney stones    Kidney stones     Current Outpatient Medications  Medication Sig Dispense Refill   Armodafinil 150 MG tablet TAKE 1/2 TABLET (75 MG TOTAL) BY MOUTH DAILY. (Patient taking differently: Take 75 mg by mouth daily.) 15 tablet 4   HYDROcodone bit-homatropine (HYCODAN) 5-1.5 MG/5ML syrup Take 5 mLs by mouth every 8 (eight) hours as needed for cough. 120 mL 0   hydrOXYzine (VISTARIL) 25 MG capsule TAKE 1-2 CAPSULES BY MOUTH AT BEDTIME AS NEEDED. FOR SEVERE ANXIETY , RACING THOUGHTS, SLEEP (Patient taking differently: Take 25-50 mg by mouth at bedtime as needed for anxiety.) 180 capsule 1   pantoprazole (PROTONIX) 20 MG tablet Take 1 tablet (20 mg total) by mouth daily. 90 tablet 3   No current facility-administered medications for this visit.    Allergies  Allergen Reactions   Flomax [Tamsulosin Hcl] Rash    Family History  Problem Relation Age of Onset   Kidney failure Father     Social History   Socioeconomic History   Marital status: Married    Spouse name: [name]   Number  of children: 0   Years of education: Not on file   Highest education level: Not on file  Occupational History   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  Tobacco Use   Smoking status: Never   Smokeless tobacco: Former    Types: Chew    Quit date: 06/2020  Vaping Use   Vaping Use: Never used  Substance and Sexual Activity   Alcohol use: Yes    Alcohol/week: 1.0 standard drink of alcohol    Types: 1 Standard drinks or equivalent per week    Comment: occassional   Drug use: No   Sexual activity: Yes  Other Topics Concern   Not on file  Social History Narrative   Not on file   Social Determinants of Health   Financial Resource Strain: Low Risk  (06/26/2018)   Overall Financial Resource Strain (CARDIA)    Difficulty of Paying Living Expenses: Not hard at all  Food Insecurity: No Food Insecurity (06/26/2018)   Hunger Vital Sign    Worried About Running Out of Food in the Last Year: Never true    [place] in the Last Year: Never true  Transportation Needs: No Transportation Needs (06/26/2018)   PRAPARE - Hydrologist (Medical): No    Lack of Transportation (Non-Medical): No  Physical Activity: Insufficiently Active (06/26/2018)   Exercise Vital Sign    Days of Exercise per Week: 7 days    Minutes of Exercise per Session: 20 min  Stress: No Stress Concern Present (06/26/2018)   [place]    Feeling of Stress : Only a little  Social Connections: Somewhat Isolated (06/26/2018)   Social Connection and Isolation Panel [NHANES]    Frequency of Communication with Friends and Family: Twice a week    Frequency of Social Gatherings with Friends and Family: Twice a week    Attends Religious Services: Never    Marine scientist or Organizations: No    Attends Archivist Meetings: Never    Marital Status: Married  Human resources officer Violence: Not At Risk  (06/26/2018)   Humiliation, Afraid, Rape, and Kick questionnaire    Fear of Current or Ex-Partner: No    Emotionally Abused: No    Physically Abused: No    Sexually Abused: No     Constitutional: Denies fever, malaise, fatigue, headache or abrupt weight changes.  HEENT: Denies eye pain, eye redness, ear pain, ringing in the ears, wax buildup, runny nose, nasal congestion, bloody nose, or sore throat. Respiratory: Denies difficulty breathing, shortness of breath, cough or sputum production.   Cardiovascular: Denies chest pain, chest tightness, palpitations or swelling in the hands or feet.  Gastrointestinal: Denies abdominal pain, bloating, constipation, diarrhea or blood in the stool.  GU: Patient has a history of erectile dysfunction.  Denies urgency, frequency, pain with urination, burning sensation, blood in urine, odor or discharge. Musculoskeletal: Denies decrease in range of motion, difficulty with gait, muscle pain or joint pain and swelling.  Skin: Denies redness, rashes, lesions or ulcercations.  Neurological: Patient reports insomnia.  Denies dizziness, difficulty with memory, difficulty with speech or problems with balance and coordination.  Psych: Patient has a history of depression.  Denies anxiety, SI/HI.  No other specific complaints in a complete review of systems (except as listed in HPI above).  Objective:   Physical Exam  BP 132/86 (BP Location: Right Arm, Patient Position: Sitting, Cuff Size: Normal)   Pulse 77   Temp (!) 96.8 F (36 C) (Temporal)   Wt 213 lb (96.6 kg)   SpO2 100%   BMI 29.71 kg/m   Wt Readings from Last 3 Encounters:  05/11/21 222 lb (100.7 kg)  05/08/21 218 lb (98.9 kg)  04/26/21 217 lb (98.4 kg)    General: Appears his stated age, overweight, in NAD. Skin: Warm, dry and intact.  HEENT: Head: normal shape and size; Eyes: sclera white, no icterus, conjunctiva pink, PERRLA and EOMs intact;  Cardiovascular: Normal rate and rhythm. S1,S2  noted.  No murmur, rubs or gallops noted.  Pulmonary/Chest: Normal effort and positive vesicular breath sounds. No respiratory distress. No wheezes, rales or ronchi noted.  Abdomen: Normal bowel sounds. Musculoskeletal: No difficulty with gait.  Neurological: Alert and oriented.   Psychiatric: Mood and affect normal. Behavior is normal. Judgment and thought content normal.     BMET    Component Value Date/Time   NA 136 04/27/2021 0525   NA 142 05/26/2020 0814   K 3.8 04/27/2021 0525   CL 102 04/27/2021 0525   CO2 27 04/27/2021 0525   GLUCOSE 117 (H) 04/27/2021 0525   BUN 13 04/27/2021 0525   BUN 24 05/26/2020 0814   CREATININE 0.76 04/27/2021 0525   CREATININE 1.08 02/11/2020 1505   CALCIUM 9.0 04/27/2021  0525   GFRNONAA >60 04/27/2021 0525   GFRAA 89 07/09/2018 0836    Lipid Panel     Component Value Date/Time   CHOL 261 (H) 04/23/2021 0325   CHOL 169 05/26/2020 0814   TRIG 91 04/24/2021 0559   HDL 40 (L) 04/23/2021 0325   HDL 37 (L) 05/26/2020 0814   CHOLHDL 6.5 04/23/2021 0325   VLDL 28 04/23/2021 0325   LDLCALC 193 (H) 04/23/2021 0325   LDLCALC 115 (H) 05/26/2020 0814   LDLCALC 196 (H) 02/11/2020 1505    CBC    Component Value Date/Time   WBC 8.9 04/25/2021 0413   RBC 4.49 04/25/2021 0413   HGB 13.7 04/25/2021 0413   HGB 14.0 07/09/2018 0836   HCT 40.4 04/25/2021 0413   HCT 41.0 07/09/2018 0836   PLT 154 04/25/2021 0413   PLT 174 07/09/2018 0836   MCV 90.0 04/25/2021 0413   MCV 91 07/09/2018 0836   MCH 30.5 04/25/2021 0413   MCHC 33.9 04/25/2021 0413   RDW 12.5 04/25/2021 0413   RDW 13.2 07/09/2018 0836   LYMPHSABS 0.8 07/09/2018 0836   MONOABS 0.8 04/18/2015 1930   EOSABS 0.1 07/09/2018 0836   BASOSABS 0.0 07/09/2018 0836    Hgb A1C Lab Results  Component Value Date   HGBA1C 5.4 02/11/2020           Assessment & Plan:      [name], NP

## 2021-08-16 NOTE — Assessment & Plan Note (Signed)
He is not taking any medications for this We will monitor

## 2021-08-16 NOTE — Assessment & Plan Note (Signed)
Not currently taking any medications for this We will monitor

## 2021-08-24 ENCOUNTER — Encounter: Payer: Self-pay | Admitting: Gastroenterology

## 2021-08-24 ENCOUNTER — Ambulatory Visit: Payer: Managed Care, Other (non HMO) | Admitting: Anesthesiology

## 2021-08-24 ENCOUNTER — Ambulatory Visit
Admission: RE | Admit: 2021-08-24 | Discharge: 2021-08-24 | Disposition: A | Discharge status: Home / Self Care | Payer: Managed Care, Other (non HMO) | Attending: Gastroenterology | Admitting: Gastroenterology

## 2021-08-24 ENCOUNTER — Encounter
Admission: RE | Disposition: A | Discharge status: Home / Self Care | Payer: Self-pay | Source: Home / Self Care | Attending: Gastroenterology

## 2021-08-24 DIAGNOSIS — F319 Bipolar disorder, unspecified: Secondary | ICD-10-CM | POA: Insufficient documentation

## 2021-08-24 DIAGNOSIS — K5730 Diverticulosis of large intestine without perforation or abscess without bleeding: Secondary | ICD-10-CM | POA: Insufficient documentation

## 2021-08-24 DIAGNOSIS — D126 Benign neoplasm of colon, unspecified: Secondary | ICD-10-CM | POA: Diagnosis not present

## 2021-08-24 DIAGNOSIS — K635 Polyp of colon: Secondary | ICD-10-CM | POA: Insufficient documentation

## 2021-08-24 DIAGNOSIS — Z1211 Encounter for screening for malignant neoplasm of colon: Secondary | ICD-10-CM | POA: Diagnosis present

## 2021-08-24 DIAGNOSIS — F419 Anxiety disorder, unspecified: Secondary | ICD-10-CM | POA: Insufficient documentation

## 2021-08-24 HISTORY — DX: Personal history of urinary calculi: Z87.442

## 2021-08-24 HISTORY — PX: COLONOSCOPY WITH PROPOFOL: SHX5780

## 2021-08-24 SURGERY — COLONOSCOPY WITH PROPOFOL
Anesthesia: General

## 2021-08-24 MED ORDER — PROPOFOL 500 MG/50ML IV EMUL
INTRAVENOUS | Status: DC | PRN
Start: 2021-08-24 — End: 2021-08-24
  Administered 2021-08-24: 125 ug/kg/min via INTRAVENOUS

## 2021-08-24 MED ORDER — PROPOFOL 10 MG/ML IV BOLUS
INTRAVENOUS | Status: DC | PRN
  Administered 2021-08-24: 100 mg via INTRAVENOUS
  Administered 2021-08-24: 30 mg via INTRAVENOUS
  Administered 2021-08-24: 50 mg via INTRAVENOUS

## 2021-08-24 MED ORDER — PROPOFOL 1000 MG/100ML IV EMUL
INTRAVENOUS | Status: AC
  Filled 2021-08-24: qty 100

## 2021-08-24 MED ORDER — SODIUM CHLORIDE 0.9 % IV SOLN
INTRAVENOUS | Status: DC
  Administered 2021-08-24: 1000 mL via INTRAVENOUS

## 2021-08-24 MED ORDER — DEXMEDETOMIDINE HCL IN NACL 200 MCG/50ML IV SOLN
INTRAVENOUS | Status: DC | PRN
  Administered 2021-08-24: 10 ug via INTRAVENOUS

## 2021-08-24 MED ORDER — LIDOCAINE HCL (CARDIAC) PF 100 MG/5ML IV SOSY
PREFILLED_SYRINGE | INTRAVENOUS | Status: DC | PRN
  Administered 2021-08-24: 50 mg via INTRAVENOUS

## 2021-08-24 NOTE — Anesthesia Postprocedure Evaluation (Signed)
Anesthesia Post Note  Patient: Dale Davis  Procedure(s) Performed: COLONOSCOPY WITH PROPOFOL  Patient location during evaluation: PACU Anesthesia Type: General Level of consciousness: awake and oriented Pain management: satisfactory to patient Vital Signs Assessment: post-procedure vital signs reviewed and stable Respiratory status: spontaneous breathing and nonlabored ventilation Cardiovascular status: stable Anesthetic complications: no   No notable events documented.   Last Vitals:  Vitals:   08/24/21 0820 08/24/21 0830  BP: 107/74 120/85  Pulse: 64 66  Resp: 15 14  Temp:    SpO2: 100% 100%    Last Pain:  Vitals:   08/24/21 0830  TempSrc:   PainSc: 0-No pain                 VAN STAVEREN,Jomo Forand

## 2021-08-24 NOTE — Anesthesia Preprocedure Evaluation (Signed)
Anesthesia Evaluation  Patient identified by MRN, date of birth, ID band Patient awake    Airway Mallampati: III  TM Distance: >3 FB Neck ROM: Full    Dental  (+) Teeth Intact   Pulmonary neg pulmonary ROS,    breath sounds clear to auscultation       Cardiovascular Exercise Tolerance: Good  Rhythm:Regular Rate:Normal     Neuro/Psych Anxiety Depression Bipolar Disorder negative neurological ROS     GI/Hepatic Neg liver ROS, GERD  ,  Endo/Other  negative endocrine ROS  Renal/GU   negative genitourinary   Musculoskeletal negative musculoskeletal ROS (+)   Abdominal   Peds negative pediatric ROS (+)  Hematology negative hematology ROS (+)   Anesthesia Other Findings   Reproductive/Obstetrics negative OB ROS                             Anesthesia Physical Anesthesia Plan  ASA: 2  Anesthesia Plan: General   Post-op Pain Management:    Induction: Intravenous  PONV Risk Score and Plan:   Airway Management Planned: Natural Airway  Additional Equipment:   Intra-op Plan:   Post-operative Plan:   Informed Consent: I have reviewed the patients History and Physical, chart, labs and discussed the procedure including the risks, benefits and alternatives for the proposed anesthesia with the patient or authorized representative who has indicated his/her understanding and acceptance.       Plan Discussed with: CRNA and Surgeon  Anesthesia Plan Comments:         Anesthesia Quick Evaluation

## 2021-08-24 NOTE — Transfer of Care (Signed)
Immediate Anesthesia Transfer of Care Note  Patient: Dale Davis  Procedure(s) Performed: COLONOSCOPY WITH PROPOFOL  Patient Location: PACU  Anesthesia Type:General  Level of Consciousness: drowsy  Airway & Oxygen Therapy: Patient Spontanous Breathing and Patient connected to nasal cannula oxygen  Post-op Assessment: Report given to RN and Post -op Vital signs reviewed and stable  Post vital signs: stable  Last Vitals:  Vitals Value Taken Time  BP 105/79 08/24/21 0810  Temp 36.4 C 08/24/21 0810  Pulse 63 08/24/21 0813  Resp 15 08/24/21 0813  SpO2 98 % 08/24/21 0813  Vitals shown include unvalidated device data.  Last Pain:  Vitals:   08/24/21 0810  TempSrc: Temporal  PainSc: Asleep         Complications: No notable events documented.

## 2021-08-24 NOTE — H&P (Signed)
[name], MD [address], [place], [place], [place], [address] [address], [place], [place], [place], [address] Phone: [phone]  Fax: [phone]  Primary Care Physician:  Jearld Fenton, NP   Pre-Procedure History & Physical: HPI:  Dale Davis is a 46 y.o. male is here for an colonoscopy.   Past Medical History:  Diagnosis Date   Anxiety    Depression    History of kidney stones    Kidney stones    Kidney stones     Past Surgical History:  Procedure Laterality Date   CHOLECYSTECTOMY N/A 04/26/2021   Procedure: LAPAROSCOPIC CHOLECYSTECTOMY;  Surgeon: Rusty Aus, DO;  Location: [facility];  Service: General;  Laterality: N/A;   ERCP N/A 04/24/2021   Procedure: ENDOSCOPIC RETROGRADE CHOLANGIOPANCREATOGRAPHY (ERCP);  Surgeon: Rogene Houston, MD;  Location: [facility];  Service: Gastroenterology;  Laterality: N/A;   ESOPHAGEAL DILATION N/A 04/24/2021   Procedure: ESOPHAGEAL DILATION;  Surgeon: Rogene Houston, MD;  Location: [facility];  Service: Gastroenterology;  Laterality: N/A;   ESOPHAGOGASTRODUODENOSCOPY (EGD) WITH PROPOFOL N/A 04/24/2021   Procedure: ESOPHAGOGASTRODUODENOSCOPY (EGD) WITH PROPOFOL;  Surgeon: Rogene Houston, MD;  Location: [facility];  Service: Gastroenterology;  Laterality: N/A;   TOE AMPUTATION Right 03/29/2015   big toe    Prior to Admission medications   Medication Sig Start Date End Date Taking? Authorizing Provider  Armodafinil 150 MG tablet TAKE 1/2 TABLET (75 MG TOTAL) BY MOUTH DAILY. Patient taking differently: Take 75 mg by mouth daily. 03/28/21   Ursula Alert, MD  hydrOXYzine (VISTARIL) 25 MG capsule TAKE 1-2 CAPSULES BY MOUTH AT BEDTIME AS NEEDED. FOR SEVERE ANXIETY , RACING THOUGHTS, SLEEP Patient taking differently: Take 25-50 mg by mouth at bedtime as needed for anxiety. 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  Tobacco Use   Smoking status: Never   Smokeless tobacco: Former    Types: Chew    Quit date: 06/2020  Vaping Use   Vaping Use: Never used  Substance and Sexual Activity   Alcohol use: Yes    Alcohol/week: 1.0 standard drink of alcohol    Types: 1 Standard drinks or equivalent per week    Comment: occassional   Drug use: No   Sexual activity: Yes  Other Topics Concern   Not on file  Social History Narrative   Not on file   Social Determinants of Health   Financial Resource Strain: Low Risk  (06/26/2018)   Overall Financial Resource Strain (CARDIA)    Difficulty of Paying Living Expenses: Not hard at all  Food Insecurity: No Food Insecurity (06/26/2018)   Hunger Vital Sign    Worried About Running Out of Food in the Last Year: Never true    [place] in the Last Year: Never true  Transportation Needs: No Transportation Needs (06/26/2018)   PRAPARE - Hydrologist (Medical): No  Lack of Transportation (Non-Medical): No  Physical Activity: Insufficiently Active (06/26/2018)   Exercise Vital Sign    Days of Exercise per Week: 7 days    Minutes of Exercise per Session: 20 min  Stress: No Stress Concern Present (06/26/2018)   [place]    Feeling of Stress : Only a little  Social Connections: Somewhat Isolated (06/26/2018)   Social Connection and Isolation Panel [NHANES]    Frequency of Communication with Friends and Family: Twice a week    Frequency of Social Gatherings with Friends and Family: Twice a week     Attends Religious Services: Never    Marine scientist or Organizations: No    Attends Archivist Meetings: Never    Marital Status: Married  Human resources officer Violence: Not At Risk (06/26/2018)   Humiliation, Afraid, Rape, and Kick questionnaire    Fear of Current or Ex-Partner: No    Emotionally Abused: No    Physically Abused: No    Sexually Abused: No    Review of Systems: See HPI, otherwise negative ROS  Physical Exam: BP (!) 135/92   Pulse 79   Temp (!) 96.5 F (35.8 C) (Temporal)   Resp 18   Ht 6' (1.829 m)   Wt 94 kg   SpO2 100%   BMI 28.12 kg/m  General:   Alert,  pleasant and cooperative in NAD Head:  Normocephalic and atraumatic. Neck:  Supple; no masses or thyromegaly. Lungs:  Clear throughout to auscultation, normal respiratory effort.    Heart:  +S1, +S2, Regular rate and rhythm, No edema. Abdomen:  Soft, nontender and nondistended. Normal bowel sounds, without guarding, and without rebound.   Neurologic:  Alert and  oriented x4;  grossly normal neurologically.  Impression/Plan: Dale Davis is here for an colonoscopy to be performed for Screening colonoscopy average risk   Risks, benefits, limitations, and alternatives regarding  colonoscopy have been reviewed with the patient.  Questions have been answered.  All parties agreeable.   [name], MD  08/24/2021, 7:46 AM

## 2021-08-24 NOTE — Op Note (Signed)
[facility] Gastroenterology Patient Name: Dale Davis Procedure Date: 08/24/2021 7:49 AM MRN: [id number] Account #: [id number] Date of Birth: 09/21/1975 Admit Type: Outpatient Age: 46 Room: [facility] ENDO ROOM 4 Gender: Male Note Status: Finalized Instrument Name: Park Meo [id number] Procedure:             Colonoscopy Indications:           Screening for colorectal malignant neoplasm Providers:             [name] MD, MD Referring MD:          Jearld Fenton (Referring MD) Medicines:             Monitored Anesthesia Care Complications:         No immediate complications. Procedure:             Pre-Anesthesia Assessment:                        - Prior to the procedure, a History and Physical was                         performed, and patient medications, allergies and                         sensitivities were reviewed. The patient's tolerance                         of previous anesthesia was reviewed.                        - The risks and benefits of the procedure and the                         sedation options and risks were discussed with the                         patient. All questions were answered and informed                         consent was obtained.                        - ASA Grade Assessment: II - A patient with mild                         systemic disease.                        After obtaining informed consent, the colonoscope was                         passed under direct vision. Throughout the procedure,                         the patient's blood pressure, pulse, and oxygen                         saturations were monitored continuously. The                         Colonoscope was introduced  through the anus and                         advanced to the the cecum, identified by the                         appendiceal orifice. The colonoscopy was performed                         with ease. The patient tolerated the procedure well.                          The quality of the bowel preparation was good. Findings:      The perianal and digital rectal examinations were normal.      Multiple small-mouthed diverticula were found in the sigmoid colon.      A 5 mm polyp was found in the sigmoid colon. The polyp was sessile. The       polyp was removed with a cold snare. Resection and retrieval were       complete.      The exam was otherwise without abnormality on direct and retroflexion       views. Impression:            - Diverticulosis in the sigmoid colon.                        - One 5 mm polyp in the sigmoid colon, removed with a                         cold snare. Resected and retrieved.                        - The examination was otherwise normal on direct and                         retroflexion views. Recommendation:        - Discharge patient to home (with escort).                        - Resume previous diet.                        - Continue present medications.                        - Await pathology results.                        - Repeat colonoscopy for surveillance based on                         pathology results. Procedure Code(s):     --- Professional ---                        [phone], Colonoscopy, flexible; with removal of                         tumor(s), polyp(s), or other lesion(s) by snare  technique Diagnosis Code(s):     --- Professional ---                        Z12.11, Encounter for screening for malignant neoplasm                         of colon                        K63.5, Polyp of colon                        K57.30, Diverticulosis of large intestine without                         perforation or abscess without bleeding CPT copyright 2019 American Medical Association. All rights reserved. The codes documented in this report are preliminary and upon coder review may  be revised to meet current compliance requirements. [name], MD [name] MD, MD 08/24/2021  8:04:56 AM This report has been signed electronically. Number of Addenda: 0 Note Initiated On: 08/24/2021 7:49 AM Scope Withdrawal Time: 0 hours 8 minutes 26 seconds  Total Procedure Duration: 0 hours 9 minutes 41 seconds  Estimated Blood Loss:  Estimated blood loss: none.      [facility]

## 2021-08-27 LAB — SURGICAL PATHOLOGY

## 2021-08-29 ENCOUNTER — Encounter: Payer: Self-pay | Admitting: Gastroenterology

## 2021-09-07 LAB — LIPID PANEL
Cholesterol: 308 — AB (ref 0–200)
HDL: 42 (ref 35–70)
LDL Cholesterol: 225
Triglycerides: 207 — AB (ref 40–160)

## 2021-09-07 LAB — BASIC METABOLIC PANEL
Creatinine: 0.9 (ref 0.6–1.3)
Glucose: 91

## 2021-09-07 LAB — HEMOGLOBIN A1C: Hemoglobin A1C: 5.3

## 2021-09-07 LAB — COMPREHENSIVE METABOLIC PANEL: eGFR: 106

## 2021-09-13 ENCOUNTER — Encounter: Payer: Self-pay | Admitting: Internal Medicine

## 2021-09-13 MED ORDER — ROSUVASTATIN CALCIUM 5 MG PO TABS: 5.0000 mg | ORAL_TABLET | Freq: Every day | ORAL | 1 refills | Status: DC

## 2021-09-13 NOTE — Telephone Encounter (Signed)
Are you able to print this out and abstract the cholesterol, A1c, creatinine and BUN?

## 2021-09-26 ENCOUNTER — Encounter: Payer: Self-pay | Admitting: Internal Medicine

## 2021-09-26 ENCOUNTER — Telehealth: Payer: Self-pay

## 2021-09-26 DIAGNOSIS — G4726 Circadian rhythm sleep disorder, shift work type: Secondary | ICD-10-CM

## 2021-09-26 MED ORDER — ARMODAFINIL 150 MG PO TABS: 75.0000 mg | ORAL_TABLET | Freq: Every day | ORAL | 1 refills | Status: DC

## 2021-09-26 NOTE — Telephone Encounter (Signed)
received a fax requesting a refill on the armodafinil '150mg'$ .  pt last seen on4-20-23 next appt 11-14-21   Armodafinil 150 MG tablet Medication Date: 03/28/2021 Department: [facility] Ordering/Authorizing: Ursula Alert, MD   Order Providers  Prescribing Provider Encounter Provider  Ursula Alert, MD Ursula Alert, MD   Outpatient Medication Detail   Disp Refills Start End   Armodafinil 150 MG tablet 15 tablet 4 03/28/2021    Sig: TAKE 1/2 TABLET (75 MG TOTAL) BY MOUTH DAILY.   Patient taking differently: Take 75 mg by mouth daily.       Sent to pharmacy as: Armodafinil 150 MG tablet   Notes to Pharmacy: Not to exceed 4 additional fills before 07/16/2021 DX Code Needed  .   E-Prescribing Status: Receipt confirmed by pharmacy (03/28/2021  2:29 PM EST)

## 2021-09-26 NOTE — Telephone Encounter (Signed)
I have sent Armodafinil to pharmacy.  At [facility].

## 2021-09-27 MED ORDER — ATORVASTATIN CALCIUM 20 MG PO TABS: 20.0000 mg | ORAL_TABLET | Freq: Every day | ORAL | 1 refills | Status: DC

## 2021-11-14 ENCOUNTER — Encounter: Payer: Self-pay | Admitting: Psychiatry

## 2021-11-14 ENCOUNTER — Telehealth: Payer: 59 | Admitting: Psychiatry

## 2021-11-14 DIAGNOSIS — F3178 Bipolar disorder, in full remission, most recent episode mixed: Secondary | ICD-10-CM | POA: Diagnosis not present

## 2021-11-14 DIAGNOSIS — G4726 Circadian rhythm sleep disorder, shift work type: Secondary | ICD-10-CM

## 2021-11-14 DIAGNOSIS — G4700 Insomnia, unspecified: Secondary | ICD-10-CM

## 2021-11-14 MED ORDER — ARMODAFINIL 150 MG PO TABS: 75.0000 mg | ORAL_TABLET | Freq: Every day | ORAL | 1 refills | Status: DC

## 2021-11-14 MED ORDER — TEMAZEPAM 7.5 MG PO CAPS
7.5000 mg | ORAL_CAPSULE | Freq: Every evening | ORAL | 1 refills | Status: DC | PRN
Start: 2021-11-14 — End: 2021-12-31

## 2021-11-14 NOTE — Progress Notes (Unsigned)
Virtual Visit via Video Note  I connected with Dale Davis on 11/15/21 at  1:00 PM EDT by a video enabled telemedicine application and verified that I am speaking with the correct person using two identifiers.  Location Provider Location : [facility] Patient Location : Work  Participants: Patient , Provider    I discussed the limitations of evaluation and management by telemedicine and the availability of in person appointments. The patient expressed understanding and agreed to proceed.   I discussed the assessment and treatment plan with the patient. The patient was provided an opportunity to ask questions and all were answered. The patient agreed with the plan and demonstrated an understanding of the instructions.   The patient was advised to call back or seek an in-person evaluation if the symptoms worsen or if the condition fails to improve as anticipated.    [place] MD OP Progress Note  11/15/2021 11:01 AM Dale Davis  MRN:  [id number]  Chief Complaint:  Chief Complaint  Patient presents with   Follow-up: 46 year old Caucasian male with history of bipolar disorder, shift work sleep disorder, presented with insomnia.   HPI: Dale Davis is a 46 year old Caucasian male, married, employed, lives in [place], has a history of bipolar disorder, shift work sleep disorder, insomnia was evaluated by telemedicine today.  Patient today reports overall mood symptoms are stable.  He has not had any depression, manic or hypomanic symptoms.  Patient reports he however has been struggling with sleep at night.  He reports he has no difficulty falling asleep however once he falls asleep he feels he sleeps very light, when he wakes up in the morning he does not feel rested.  He is getting a total of 7 hours or more of sleep.  However he feels tired when he wakes up.  Patient reports he continues to take the Nuvigil in the day although he takes only 1/2 tablet.  He tried to stop using it  however he felt extremely lethargic and had to go back on it.An Epworth sleep scale was done in session and scored 3 on the same.  Patient otherwise reports no psychosocial stressors.  Reports he is doing well at work.  Denies any suicidality, homicidality or perceptual disturbances.  Patient denies any other concerns today.  Visit Diagnosis:    ICD-10-CM   1. Bipolar disorder, in full remission, most recent episode mixed (HCC)  F31.78 temazepam (RESTORIL) 7.5 MG capsule    2. 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 Insomnia, unspecified type  G47.00       Past Psychiatric History: Reviewed past psychiatric history from progress note on 06/26/2018.  Past trials of medications like Symbyax Trazodone Klonopin Wellbutrin Lunesta Adderall Chantix Propranolol Cogentin Prozac Zyprexa Ambien Sonata   Past Medical History:  Past Medical History:  Diagnosis Date   Anxiety    Depression    History of kidney stones    Kidney stones    Kidney stones     Past Surgical History:  Procedure Laterality Date   CHOLECYSTECTOMY N/A 04/26/2021   Procedure: LAPAROSCOPIC CHOLECYSTECTOMY;  Surgeon: Rusty Aus, DO;  Location: [facility];  Service: General;  Laterality: N/A;   COLONOSCOPY WITH PROPOFOL N/A 08/24/2021   Procedure: COLONOSCOPY WITH PROPOFOL;  Surgeon: [name], MD;  Location: [facility] ENDOSCOPY;  Service: Gastroenterology;  Laterality: N/A;   ERCP N/A 04/24/2021   Procedure: ENDOSCOPIC RETROGRADE CHOLANGIOPANCREATOGRAPHY (ERCP);  Surgeon: Rogene Houston, MD;  Location: [facility];  Service: Gastroenterology;  Laterality: N/A;   ESOPHAGEAL DILATION N/A 04/24/2021   Procedure: ESOPHAGEAL DILATION;  Surgeon: Rogene Houston, MD;  Location: [facility];  Service: Gastroenterology;  Laterality: N/A;   ESOPHAGOGASTRODUODENOSCOPY (EGD) WITH PROPOFOL N/A 04/24/2021   Procedure: ESOPHAGOGASTRODUODENOSCOPY (EGD) WITH  PROPOFOL;  Surgeon: Rogene Houston, MD;  Location: [facility];  Service: Gastroenterology;  Laterality: N/A;   TOE AMPUTATION Right 03/29/2015   big toe    Family Psychiatric History: Reviewed family psychiatric history from progress note on 06/26/2018.  Family History:  Family History  Problem Relation Age of Onset   Kidney failure Father     Social History: Reviewed mood social history from progress note on 06/26/2018. 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  Tobacco Use   Smoking status: Never   Smokeless tobacco: Former    Types: Chew    Quit date: 06/2020  Vaping Use   Vaping Use: Never used  Substance and Sexual Activity   Alcohol use: Yes    Alcohol/week: 1.0 standard drink of alcohol    Types: 1 Standard drinks or equivalent per week    Comment: occassional   Drug use: No   Sexual activity: Yes  Other Topics Concern   Not on file  Social History Narrative   Not on file   Social Determinants of Health   Financial Resource Strain: Low Risk  (06/26/2018)   Overall Financial Resource Strain (CARDIA)    Difficulty of Paying Living Expenses: Not hard at all  Food Insecurity: No Food Insecurity (06/26/2018)   Hunger Vital Sign    Worried About Running Out of Food in the Last Year: Never true    [place] in the Last Year: Never true  Transportation Needs: No Transportation Needs (06/26/2018)   PRAPARE - Hydrologist (Medical): No    Lack of Transportation (Non-Medical): No  Physical Activity: Insufficiently Active (06/26/2018)   Exercise Vital Sign    Days of Exercise per Week: 7 days    Minutes of Exercise per Session: 20 min  Stress: No Stress Concern Present (06/26/2018)   Miguel Barrera    Feeling of Stress : Only a little  Social Connections: Somewhat Isolated (06/26/2018)   Social Connection and Isolation Panel [NHANES]    Frequency of Communication with Friends and Family: Twice a week    Frequency of Social Gatherings with Friends and Family: Twice a week    Attends Religious Services: Never    Marine scientist or Organizations: No    Attends Archivist Meetings: Never    Marital Status: Married    Allergies:  Allergies  Allergen Reactions   Flomax [Tamsulosin Hcl] Rash    Metabolic Disorder Labs: Lab Results  Component Value Date   HGBA1C 5.3 09/07/2021   MPG 108 02/11/2020   Lab Results  Component Value Date   PROLACTIN 10.7 06/06/2020   PROLACTIN 6.5 07/15/2019   Lab Results  Component Value Date   CHOL 308 (A) 09/07/2021   TRIG 207 (A) 09/07/2021   HDL 42 09/07/2021   CHOLHDL  6.5 04/23/2021   VLDL 28 04/23/2021   LDLCALC 225 09/07/2021   LDLCALC 193 (H) 04/23/2021   Lab Results  Component Value Date   TSH 1.49 06/24/2019   TSH 1.830 07/09/2018    Therapeutic Level Labs: No results found for: "LITHIUM" No results found for: "VALPROATE" No results found for: "CBMZ"  Current Medications: Current Outpatient Medications  Medication Sig Dispense Refill   temazepam (RESTORIL) 7.5 MG capsule Take 1 capsule (7.5 mg total) by mouth at bedtime as needed for sleep. 30 capsule 1   Armodafinil 150 MG tablet Take 0.5 tablets (75 mg total) by mouth daily. TAKE 1/2 TABLET (75 MG TOTAL) BY MOUTH DAILY. Strength: 150 mg 15 tablet 1   atorvastatin (LIPITOR) 20 MG tablet Take 1 tablet (20 mg total) by mouth daily. 90 tablet 1   hydrOXYzine (VISTARIL) 25 MG capsule TAKE 1-2 CAPSULES BY MOUTH AT BEDTIME AS NEEDED. FOR SEVERE ANXIETY , RACING THOUGHTS, SLEEP (Patient taking differently: Take 25-50 mg by mouth at bedtime as needed for anxiety.) 180 capsule 1   No current  facility-administered medications for this visit.     Musculoskeletal: Strength & Muscle Tone:  UTA Gait & Station:  Seated Patient leans: N/A  Psychiatric Specialty Exam: Review of Systems  Psychiatric/Behavioral:  Positive for sleep disturbance.   All other systems reviewed and are negative.   There were no vitals taken for this visit.There is no height or weight on file to calculate BMI.  General Appearance: Casual  Eye Contact:  Fair  Speech:  Clear and Coherent  Volume:  Normal  Mood:  Euthymic  Affect:  Congruent  Thought Process:  Goal Directed and Descriptions of Associations: Intact  Orientation:  Full (Time, Place, and Person)  Thought Content: Logical   Suicidal Thoughts:  No  Homicidal Thoughts:  No  Memory:  Immediate;   Fair Recent;   Fair Remote;   Fair  Judgement:  Fair  Insight:  Fair  Psychomotor Activity:  Normal  Concentration:  Concentration: Fair and Attention Span: Fair  Recall:  [employer] of Knowledge: Fair  Language: Fair  Akathisia:  No  Handed:  Right  AIMS (if indicated): not done  Assets:  Communication Skills Desire for Improvement Housing Social Support  ADL's:  Intact  Cognition: WNL  Sleep:  Poor   Screenings: [place] Office Visit from 08/16/2020 in [place] Total Score 0      GAD-7    Flowsheet Row Video Visit from 11/14/2021 in [place] Office Visit from 05/11/2021 in [facility] Video Visit from 12/13/2020 in [place] Office Visit from 08/16/2020 in [place]  Total GAD-7 Score 0 0 0 0      PHQ2-9    Flowsheet Row Video Visit from 11/14/2021 in [place] Office Visit from 05/11/2021 in [facility] Video Visit from 12/13/2020 in [place] Visit from 08/16/2020 in [place]  PHQ-2 Total Score 0 0 0 0  PHQ-9 Total Score -- 0 0 0      Flowsheet Row Video Visit from 11/14/2021 in [place] Admission (Discharged) from 08/24/2021 in [place] ED to Hosp-Admission (Discharged) from 04/23/2021 in [place] No Risk No Risk No Risk        Assessment and Plan: Dale Plowman  A Davis is a 46 year old Caucasian male, married, employed, lives in [place], has a history of bipolar disorder in remission, shift work disorder, insomnia, hypersomnia, was evaluated by telemedicine today.  Patient is currently struggling with sleep.  Plan Bipolar disorder in remission Hydroxyzine 25 to 50 mg p.o. nightly as needed Currently not on mood stabilizer.  Will monitor closely.  Shift work disorder-stable Armodafinil 150 mg p.o. daily-takes one fourth to half tablet daily Reviewed [place] PMP aware  Insomnia-unspecified Patient with sleep problems at night, hypersomnia during the day, will benefit from a sleep study. Epworth sleep scale - low. Patient was referred in the past however was noncompliant We will consider referring for sleep study in the future. Discussed sleep hygiene techniques including avoiding caffeinated drinks, winding down, switching of TV and electronic devices an hour prior to bedtime. Will start temazepam 7.5 mg p.o. nightly Reviewed [place] PMP aware Provided medication education.   Follow-up in clinic in 4 to 6 weeks or sooner if needed.  This note was generated in part or whole with voice recognition software. Voice recognition is usually quite accurate but there are transcription errors that can and very often do occur. I apologize for any typographical errors that were not detected and corrected.       Consent: Patient/Guardian gives verbal consent for treatment and assignment of benefits for services provided during this visit.  Patient/Guardian expressed understanding and agreed to proceed.    Ursula Alert, MD 11/15/2021, 11:01 AM

## 2021-11-14 NOTE — Patient Instructions (Signed)
Temazepam Capsules What is this medication? TEMAZEPAM (te MAZ e pam) treats insomnia. It helps you go to sleep faster and stay asleep through the night. It is often used for a short period of time. It belongs to a group of medications called benzodiazepines. This medicine may be used for other purposes; ask your health care provider or pharmacist if you have questions. COMMON BRAND NAME(S): Restoril What should I tell my care team before I take this medication? They need to know if you have any of these conditions: Kidney disease Liver disease Lung or breathing disease Mental health condition Myasthenia gravis Parkinson's disease Porphyria Seizures or a history of seizures Substance use disorder Suicidal thoughts, plans, or attempt by you or a family member An unusual or allergic reaction to temazepam, other benzodiazepines, other medications, foods, dyes, or preservatives Pregnant or trying to get pregnant Breast-feeding How should I use this medication? Take this medication by mouth. It is only for use at bedtime. Follow the directions on the prescription label. Swallow the tablets or capsules with a drink of water. If it upsets your stomach, take it with food or milk. Do not take your medication more often than directed. Do not stop taking except on the advice of your care team. A special MedGuide will be given to you by the pharmacist with each prescription and refill. Be sure to read this information carefully each time. Talk to your care team about the use of this medication in children. Special care may be needed. Overdosage: If you think you have taken too much of this medicine contact a poison control center or emergency room at once. NOTE: This medicine is only for you. Do not share this medicine with others. What if I miss a dose? If you miss a dose, take it as soon as you can. If it is almost time for your next dose, take only that dose. Do not take double or extra doses. What  may interact with this medication? Do not take this medication with any of the following: Opioid medications for cough Sodium oxybate This medication may also interact with the following: Alcohol Antihistamines for allergy, cough and cold Certain medications for anxiety or sleep Certain medications for depression, such as amitriptyline, fluoxetine, sertraline Certain medications for seizures, such as phenobarbital, primidone General anesthetics, such as lidocaine, pramoxine, tetracaine Medications that relax muscles for surgery Opioid medications for pain Phenothiazines, such as chlorpromazine, mesoridazine, prochlorperazine, thioridazine This list may not describe all possible interactions. Give your health care provider a list of all the medicines, herbs, non-prescription drugs, or dietary supplements you use. Also tell them if you smoke, drink alcohol, or use illegal drugs. Some items may interact with your medicine. What should I watch for while using this medication? Visit your care team for regular checks on your progress. Tell your care team if your symptoms do not start to get better or if they get worse. Do not stop taking this medication except on your care team's advice. You may develop a severe reaction. Your care team will tell you how much medication to take. Long term use of this medication may cause your brain and body to depend on it. This can happen even when used as directed by your care team. You and your care team will work together to determine how long you will need to take this medication. Plan to go to bed and stay in bed for a full night (7 to 8 hours) after you take this medication. You  may still be drowsy the morning after taking this medication. This medication may affect your coordination, reaction time, or judgment. Do not drive or operate machinery until you know how this medication affects you. Sit up or stand slowly to reduce the risk of dizzy or fainting spells.  Drinking alcohol with this medication can increase the risk of these side effects. If you are taking another medication that also causes drowsiness, you may have more side effects. Give your care team a list of all medications you use. Your care team will tell you how much medication to take. Do not take more medication than directed. Get emergency help right away if you have problems breathing or unusual sleepiness. You may do unusual sleep behaviors or activities you do not remember the day after taking this medication. Activities include driving, making or eating food, talking on the phone, sexual activity, or sleep walking. Stop taking this medication and call your care team right away if you find out you have done activities like this. After you stop taking this medication, you may have trouble falling asleep. This is called rebound insomnia. This problem usually goes away on its own after 1 or 2 nights. If you or your family notice any changes in your behavior, such as new or worsening depression, thoughts of harming yourself, anxiety, other unusual or disturbing thoughts, or memory loss, call your care team right away. Talk to your care team if you wish to become pregnant or think you might be pregnant. This medication can cause serious birth defects. Talk to your care team before breastfeeding. Changes to your treatment plan may be needed. What side effects may I notice from receiving this medication? Side effects that you should report to your care team as soon as possible: Allergic reactions--skin rash, itching, hives, swelling of the face, lips, tongue, or throat CNS depression--slow or shallow breathing, shortness of breath, feeling faint, dizziness, confusion, trouble staying awake Mood and behavior changes--anxiety, nervousness, confusion, hallucinations, irritability, hostility, thoughts of suicide or self-harm, worsening mood, feelings of depression Unusual sleep behaviors or activities  you do not remember such as driving, eating, or sexual activity Side effects that usually do not require medical attention (report to your care team if they continue or are bothersome): Dizziness Drowsiness the day after use Headache Nausea This list may not describe all possible side effects. Call your doctor for medical advice about side effects. You may report side effects to FDA at 1-800-FDA-1088. Where should I keep my medication? Keep out of the reach of children and pets. This medication can be abused. Keep your medication in a safe place to protect it from theft. Do not share this medication with anyone. Selling or giving away this medication is dangerous and against the law. This medication may cause accidental overdose and death if taken by other adults, children, or pets. Mix any unused medication with a substance like cat litter or coffee grounds. Then throw the medication away in a sealed container like a sealed bag or a coffee can with a lid. Do not use the medication after the expiration date. Store at room temperature below 30 degrees C (86 degrees F). Protect from light. Keep container tightly closed. NOTE: This sheet is a summary. It may not cover all possible information. If you have questions about this medicine, talk to your doctor, pharmacist, or health care provider.  2023 Elsevier/Gold Standard (2020-10-17 00:00:00)

## 2021-12-28 ENCOUNTER — Other Ambulatory Visit: Payer: Self-pay | Admitting: Psychiatry

## 2021-12-28 DIAGNOSIS — F3178 Bipolar disorder, in full remission, most recent episode mixed: Secondary | ICD-10-CM

## 2021-12-31 ENCOUNTER — Telehealth: Payer: 59 | Admitting: Psychiatry

## 2021-12-31 ENCOUNTER — Encounter: Payer: Self-pay | Admitting: Psychiatry

## 2021-12-31 ENCOUNTER — Encounter: Payer: Self-pay | Admitting: Internal Medicine

## 2021-12-31 DIAGNOSIS — G4726 Circadian rhythm sleep disorder, shift work type: Secondary | ICD-10-CM | POA: Diagnosis not present

## 2021-12-31 DIAGNOSIS — G4710 Hypersomnia, unspecified: Secondary | ICD-10-CM | POA: Diagnosis not present

## 2021-12-31 DIAGNOSIS — F3178 Bipolar disorder, in full remission, most recent episode mixed: Secondary | ICD-10-CM

## 2021-12-31 MED ORDER — TEMAZEPAM 30 MG PO CAPS: 30.0000 mg | ORAL_CAPSULE | Freq: Every evening | ORAL | 1 refills | Status: DC | PRN

## 2021-12-31 NOTE — Progress Notes (Unsigned)
Virtual Visit via Video Note  I connected with [name] on 12/31/21 at  2:30 PM EST by a video enabled telemedicine application and verified that I am speaking with the correct person using two identifiers.  Location Provider Location : [facility] Patient Location : Home  Participants: Patient , Provider   I discussed the limitations of evaluation and management by telemedicine and the availability of in person appointments. The patient expressed understanding and agreed to proceed.    I discussed the assessment and treatment plan with the patient. The patient was provided an opportunity to ask questions and all were answered. The patient agreed with the plan and demonstrated an understanding of the instructions.   The patient was advised to call back or seek an in-person evaluation if the symptoms worsen or if the condition fails to improve as anticipated.    Rule MD OP Progress Note  01/01/2022 9:34 AM JRU PENSE  MRN:  [id number]  Chief Complaint:  Chief Complaint  Patient presents with   Follow-up   Medication Refill   Depression   Anxiety   HPI: Dale Davis is a 46 year old Caucasian male, married, employed, lives in [place], has a history of bipolar disorder, shift work sleep disorder, insomnia was evaluated by telemedicine today.  Patient today reports he continues to struggle with sleep.  Patient reports he tried the temazepam 7.5 mg however his sleep was interrupted.  Patient reports he woke up a few times at night, was wide awake when he did that.  Patient also reports feeling unrested when he woke up the next day.  Patient had taken up to 15 mg himself and that also did not seem to help much.  He reports he hence went back to hydroxyzine, which also does not seem to help much by itself.  There are days when he wakes up and feels extremely tired and he has taken an extra dosage of armodafinil those days to stay awake during the day.  Patient reports he  normally takes 1/2 tablet.  Patient reports most days when he wakes up in the morning irrespective of his sleep at night he feels unrested.  Patient otherwise denies any mood symptoms, denies any depression or anxiety.  Patient reports appetite is fair.  Reports work is going well.  Denies any suicidality, homicidality or perceptual disturbances.  Patient denies any other concerns today.  Visit Diagnosis:    ICD-10-CM   1. Bipolar disorder, in full remission, most recent episode mixed ([place])  F31.78     2. 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 Hypersomnia  G47.10       Past Psychiatric History: Reviewed past psychiatric history from progress note on 06/26/2018.  Past trials of medications like Symbyax, trazodone, Klonopin, Wellbutrin, Lunesta, Adderall, Chantix, Prozac, propranolol, Zyprexa, Ambien, Sonata  Past Medical History:  Past Medical History:  Diagnosis Date   Anxiety    Depression    History of kidney stones    Kidney stones    Kidney stones     Past Surgical History:  Procedure Laterality Date   CHOLECYSTECTOMY N/A 04/26/2021   Procedure: LAPAROSCOPIC CHOLECYSTECTOMY;  Surgeon: Rusty Aus, DO;  Location: [facility];  Service: General;  Laterality: N/A;   COLONOSCOPY WITH PROPOFOL N/A 08/24/2021   Procedure: COLONOSCOPY WITH PROPOFOL;  Surgeon: [name], MD;  Location: [facility]COPY;  Service: Gastroenterology;  Laterality: N/A;   ERCP N/A 04/24/2021   Procedure: ENDOSCOPIC  RETROGRADE CHOLANGIOPANCREATOGRAPHY (ERCP);  Surgeon: Rogene Houston, MD;  Location: [facility];  Service: Gastroenterology;  Laterality: N/A;   ESOPHAGEAL DILATION N/A 04/24/2021   Procedure: ESOPHAGEAL DILATION;  Surgeon: Rogene Houston, MD;  Location: [facility];  Service: Gastroenterology;  Laterality: N/A;   ESOPHAGOGASTRODUODENOSCOPY (EGD) WITH PROPOFOL N/A 04/24/2021   Procedure: ESOPHAGOGASTRODUODENOSCOPY (EGD) WITH PROPOFOL;  Surgeon: Rogene Houston, MD;  Location: [facility];  Service: Gastroenterology;  Laterality: N/A;   TOE AMPUTATION Right 03/29/2015   big toe    Family Psychiatric History: Reviewed family psychiatric history from progress note on 06/26/2018  Family History:  Family History  Problem Relation Age of Onset   Kidney failure Father     Social History: Reviewed social history from progress note on 06/26/2018 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  Tobacco Use   Smoking status: Never   Smokeless tobacco: Former    Types: Chew    Quit date: 06/2020  Vaping Use   Vaping Use: Never used  Substance and Sexual Activity   Alcohol use: Yes    Alcohol/week: 1.0 standard drink of alcohol    Types: 1 Standard drinks or equivalent per week    Comment: occassional   Drug use: No   Sexual activity: Yes  Other Topics Concern   Not on file  Social History Narrative   Not on file   Social Determinants of Health   Financial Resource Strain: Low Risk  (06/26/2018)   Overall Financial Resource Strain (CARDIA)    Difficulty of Paying Living Expenses: Not hard at all  Food Insecurity: No Food Insecurity (06/26/2018)   Hunger Vital Sign    Worried About Running Out of Food in the Last Year: Never true    [place] in the Last Year: Never true  Transportation Needs: No Transportation Needs (06/26/2018)   PRAPARE - Hydrologist (Medical): No    Lack of Transportation (Non-Medical): No  Physical Activity: Insufficiently Active (06/26/2018)   Exercise Vital Sign    Days of Exercise per Week: 7 days    Minutes of Exercise per Session: 20 min  Stress: No Stress Concern Present (06/26/2018)   [place]    Feeling of Stress : Only a little  Social Connections: Somewhat Isolated (06/26/2018)   Social Connection and Isolation Panel [NHANES]    Frequency of Communication with Friends and Family: Twice a week    Frequency of Social Gatherings with Friends and Family: Twice a week    Attends Religious Services: Never    Marine scientist or Organizations: No    Attends Archivist Meetings: Never    Marital Status: Married    Allergies:  Allergies  Allergen Reactions   Flomax [Tamsulosin Hcl] Rash    Metabolic Disorder Labs: Lab Results  Component Value Date   HGBA1C 5.3 09/07/2021   MPG 108 02/11/2020   Lab Results  Component Value Date   PROLACTIN 10.7 06/06/2020   PROLACTIN 6.5 07/15/2019   Lab Results  Component Value Date   CHOL 308 (A) 09/07/2021   TRIG 207 (A) 09/07/2021   HDL 42 09/07/2021  CHOLHDL 6.5 04/23/2021   VLDL 28 04/23/2021   LDLCALC 225 09/07/2021   LDLCALC 193 (H) 04/23/2021   Lab Results  Component Value Date   TSH 1.49 06/24/2019   TSH 1.830 07/09/2018    Therapeutic Level Labs: No results found for: "LITHIUM" No results found for: "VALPROATE" No results found for: "CBMZ"  Current Medications: Current Outpatient Medications  Medication Sig Dispense Refill   Armodafinil 150 MG tablet Take 0.5 tablets (75 mg total) by mouth daily. TAKE 1/2 TABLET (75 MG TOTAL) BY MOUTH DAILY. Strength: 150 mg 15 tablet 1   atorvastatin (LIPITOR) 20 MG tablet Take 1 tablet (20 mg total) by mouth daily. 90 tablet 1   hydrOXYzine (VISTARIL) 25 MG capsule TAKE 1-2 CAPSULES BY MOUTH AT BEDTIME AS NEEDED. FOR SEVERE ANXIETY , RACING THOUGHTS, SLEEP (Patient taking differently: Take 25-50 mg by mouth at bedtime as needed for anxiety.) 180 capsule 1   temazepam (RESTORIL) 30 MG capsule Take 1 capsule (30 mg total) by mouth at bedtime as needed for sleep. 15 capsule 1   No current facility-administered medications for this visit.      Musculoskeletal: Strength & Muscle Tone:  UTA Gait & Station:  Seated Patient leans: N/A  Psychiatric Specialty Exam: Review of Systems  Constitutional:  Positive for fatigue.  Psychiatric/Behavioral:  Positive for sleep disturbance.   All other systems reviewed and are negative.   There were no vitals taken for this visit.There is no height or weight on file to calculate BMI.  General Appearance: Casual  Eye Contact:  Fair  Speech:  Clear and Coherent  Volume:  Normal  Mood:  Euthymic  Affect:  Congruent  Thought Process:  Goal Directed and Descriptions of Associations: Intact  Orientation:  Full (Time, Place, and Person)  Thought Content: Logical   Suicidal Thoughts:  No  Homicidal Thoughts:  No  Memory:  Immediate;   Fair Recent;   Fair Remote;   Fair  Judgement:  Fair  Insight:  Fair  Psychomotor Activity:  Normal  Concentration:  Concentration: Fair and Attention Span: Fair  Recall:  [employer] of Knowledge: Fair  Language: Fair  Akathisia:  No  Handed:  Right  AIMS (if indicated): not done  Assets:  Communication Skills Desire for Improvement Housing Social Support  ADL's:  Intact  Cognition: WNL  Sleep:  Poor   Screenings: [place] Office Visit from 08/16/2020 in [place] Total Score 0      GAD-7    Flowsheet Row Video Visit from 11/14/2021 in [place] Office Visit from 05/11/2021 in [facility] Video Visit from 12/13/2020 in [place] Office Visit from 08/16/2020 in [place]  Total GAD-7 Score 0 0 0 0      PHQ2-9    Flowsheet Row Video Visit from 12/31/2021 in [place] Video Visit from 11/14/2021 in [place] Office Visit from 05/11/2021 in [facility] Video Visit from 12/13/2020 in [place] Visit from 08/16/2020 in [place]  PHQ-2 Total Score 0 0 0 0 0  PHQ-9 Total Score -- -- 0 0 0      Flowsheet Row Video Visit from 12/31/2021 in [place] Video Visit from 11/14/2021 in [place] Admission (Discharged) from 08/24/2021 in [place] No Risk  No Risk No Risk        Assessment and Plan: CEVIN RUBINSTEIN is a 46 year old Caucasian male, married, employed, lives in [place], has a history of bipolar disorder, shift work disorder, insomnia, hypersomnia was evaluated by telemedicine today.  Patient continues to struggle with sleep as well as fatigue during the day, will benefit from following plan.  Plan Bipolar disorder in remission Hydroxyzine 25 to 50 mg p.o. nightly as needed  Shift work disorder-stable Armodafinil 150 mg p.o. daily-takes half to 1 tablet daily. Reviewed [place] PMP AWARxE  Insomnia/hypersomnia-unstable Increase temazepam to 30 mg p.o. nightly Patient may benefit from sleep study referral-will refer to pulmonologist if this medication also does not work. Patient reports most nights even after he sleeps the next day he feels unrested. Patient to combine hydroxyzine 25 mg with the temazepam if the temazepam does not work by itself. Patient advised to keep a sleep diary and also to continue to work on sleep hygiene techniques.  Follow-up in clinic in 2 weeks or sooner if needed.  Consent: Patient/Guardian gives verbal consent for treatment and assignment of benefits for services provided during this visit. Patient/Guardian expressed understanding and agreed to proceed.   This note was generated in part or whole with voice recognition software. Voice recognition is usually quite accurate but there are transcription errors that can and very often do occur. I apologize for any typographical  errors that were not detected and corrected.      Ursula Alert, MD 01/01/2022, 9:34 AM

## 2022-01-01 ENCOUNTER — Encounter: Payer: Self-pay | Admitting: Internal Medicine

## 2022-01-01 ENCOUNTER — Ambulatory Visit: Payer: Managed Care, Other (non HMO) | Admitting: Internal Medicine

## 2022-01-01 ENCOUNTER — Other Ambulatory Visit: Payer: Self-pay | Admitting: Internal Medicine

## 2022-01-01 ENCOUNTER — Other Ambulatory Visit: Payer: Self-pay

## 2022-01-01 VITALS — BP 128/84 | HR 85 | Temp 98.4°F | Wt 215.0 lb

## 2022-01-01 DIAGNOSIS — L738 Other specified follicular disorders: Secondary | ICD-10-CM

## 2022-01-01 DIAGNOSIS — B370 Candidal stomatitis: Secondary | ICD-10-CM

## 2022-01-01 MED ORDER — NYSTATIN 100000 UNIT/ML MT SUSP
5.0000 mL | Freq: Four times a day (QID) | OROMUCOSAL | 0 refills | Status: DC
  Filled 2022-01-01: qty 60, 3d supply, fill #0
  Filled 2022-01-02: qty 180, 9d supply, fill #0

## 2022-01-01 MED ORDER — NYSTATIN 100000 UNIT/ML MT SUSP: 5.0000 mL | Freq: Four times a day (QID) | OROMUCOSAL | 0 refills | Status: DC

## 2022-01-01 MED ORDER — MUPIROCIN 2 % EX OINT: 1.0000 | TOPICAL_OINTMENT | Freq: Two times a day (BID) | CUTANEOUS | 0 refills | Status: DC

## 2022-01-01 NOTE — Progress Notes (Signed)
Subjective:    Patient ID: Dale Davis, male    DOB: 1975-11-08, 46 y.o.   MRN: [id number]  HPI  Pt presents to the clinic today with c/o skin lesion on his chin. He noticed this a few months ago. He reports the area started out as like pimples but then seem to scab over.  He does report it seems worse when he shaves.  The lesions have not spread.  He also reports yellow discoloration of his tongue.  He noticed this a few weeks ago.  He denies sore tongue, sore throat, itching or bad taste in his mouth.  He does not smoke.  He has not tried anything OTC for this.  Review of Systems     Past Medical History:  Diagnosis Date   Anxiety    Depression    History of kidney stones    Kidney stones    Kidney stones     Current Outpatient Medications  Medication Sig Dispense Refill   Armodafinil 150 MG tablet Take 0.5 tablets (75 mg total) by mouth daily. TAKE 1/2 TABLET (75 MG TOTAL) BY MOUTH DAILY. Strength: 150 mg 15 tablet 1   atorvastatin (LIPITOR) 20 MG tablet Take 1 tablet (20 mg total) by mouth daily. 90 tablet 1   hydrOXYzine (VISTARIL) 25 MG capsule TAKE 1-2 CAPSULES BY MOUTH AT BEDTIME AS NEEDED. FOR SEVERE ANXIETY , RACING THOUGHTS, SLEEP (Patient taking differently: Take 25-50 mg by mouth at bedtime as needed for anxiety.) 180 capsule 1   temazepam (RESTORIL) 30 MG capsule Take 1 capsule (30 mg total) by mouth at bedtime as needed for sleep. 15 capsule 1   No current facility-administered medications for this visit.    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  Tobacco Use   Smoking  status: Never   Smokeless tobacco: Former    Types: Chew    Quit date: 06/2020  Vaping Use   Vaping Use: Never used  Substance and Sexual Activity   Alcohol use: Yes    Alcohol/week: 1.0 standard drink of alcohol    Types: 1 Standard drinks or equivalent per week    Comment: occassional   Drug use: No   Sexual activity: Yes  Other Topics Concern   Not on file  Social History Narrative   Not on file   Social Determinants of Health   Financial Resource Strain: Low Risk  (06/26/2018)   Overall Financial Resource Strain (CARDIA)    Difficulty of Paying Living Expenses: Not hard at all  Food Insecurity: No Food Insecurity (06/26/2018)   Hunger Vital Sign    Worried About Running Out of Food in the Last Year: Never true    [place] in the Last Year: Never true  Transportation Needs: No Transportation Needs (06/26/2018)   PRAPARE - Hydrologist (Medical): No    Lack of Transportation (Non-Medical): No  Physical Activity: Insufficiently Active (06/26/2018)   Exercise Vital Sign    Days of Exercise per Week: 7  days    Minutes of Exercise per Session: 20 min  Stress: No Stress Concern Present (06/26/2018)   [place]    Feeling of Stress : Only a little  Social Connections: Somewhat Isolated (06/26/2018)   Social Connection and Isolation Panel [NHANES]    Frequency of Communication with Friends and Family: Twice a week    Frequency of Social Gatherings with Friends and Family: Twice a week    Attends Religious Services: Never    Marine scientist or Organizations: No    Attends Archivist Meetings: Never    Marital Status: Married  Human resources officer Violence: Not At Risk (06/26/2018)   Humiliation, Afraid, Rape, and Kick questionnaire    Fear of Current or Ex-Partner: No    Emotionally Abused: No    Physically Abused: No    Sexually Abused: No     Constitutional: Denies  fever, malaise, fatigue, headache or abrupt weight changes.  HEENT: Patient reports discoloration of tongue.  Denies eye pain, eye redness, ear pain, ringing in the ears, wax buildup, runny nose, nasal congestion, bloody nose, or sore throat. Respiratory: Denies difficulty breathing, shortness of breath, cough or sputum production.   Cardiovascular: Denies chest pain, chest tightness, palpitations or swelling in the hands or feet.  Skin: Patient reports skin lesions of chin.  Denies redness, rashes, or ulcercations.    No other specific complaints in a complete review of systems (except as listed in HPI above).  Objective:   Physical Exam  BP 128/84 (BP Location: Left Arm, Patient Position: Sitting, Cuff Size: Large)   Pulse 85   Temp 98.4 F (36.9 C) (Oral)   Wt 215 lb (97.5 kg)   SpO2 100%   BMI 29.16 kg/m   Wt Readings from Last 3 Encounters:  08/24/21 207 lb 5.1 oz (94 kg)  08/16/21 213 lb (96.6 kg)  05/11/21 222 lb (100.7 kg)    General: Appears his stated age, overweight, in NAD. Skin: 2 scabbed areas noted of the anterior chin. HEENT: Head: normal shape and size; Eyes: sclera white, no icterus, conjunctiva pink, PERRLA and EOMs intact;  Throat/Mouth: Teeth present, mucosa pink and moist, no exudate, lesions or ulcerations noted.  Yellow discoloration noted of tongue. Cardiovascular: Normal rate and rhythm.  Pulmonary/Chest: Normal effort and positive vesicular breath sounds. No respiratory distress. No wheezes, rales or ronchi noted.  Neurological: Alert and oriented.   BMET    Component Value Date/Time   NA 136 04/27/2021 0525   NA 142 05/26/2020 0814   K 3.8 04/27/2021 0525   CL 102 04/27/2021 0525   CO2 27 04/27/2021 0525   GLUCOSE 117 (H) 04/27/2021 0525   BUN 13 04/27/2021 0525   BUN 24 05/26/2020 0814   CREATININE 0.9 09/07/2021 0000   CREATININE 0.76 04/27/2021 0525   CREATININE 1.08 02/11/2020 1505   CALCIUM 9.0 04/27/2021 0525   GFRNONAA >60 04/27/2021  0525   GFRAA 89 07/09/2018 0836    Lipid Panel     Component Value Date/Time   CHOL 308 (A) 09/07/2021 0000   CHOL 169 05/26/2020 0814   TRIG 207 (A) 09/07/2021 0000   HDL 42 09/07/2021 0000   HDL 37 (L) 05/26/2020 0814   CHOLHDL 6.5 04/23/2021 0325   VLDL 28 04/23/2021 0325   LDLCALC 225 09/07/2021 0000   LDLCALC 115 (H) 05/26/2020 0814   LDLCALC 196 (H) 02/11/2020 1505    CBC    Component  Value Date/Time   WBC 8.9 04/25/2021 0413   RBC 4.49 04/25/2021 0413   HGB 13.7 04/25/2021 0413   HGB 14.0 07/09/2018 0836   HCT 40.4 04/25/2021 0413   HCT 41.0 07/09/2018 0836   PLT 154 04/25/2021 0413   PLT 174 07/09/2018 0836   MCV 90.0 04/25/2021 0413   MCV 91 07/09/2018 0836   MCH 30.5 04/25/2021 0413   MCHC 33.9 04/25/2021 0413   RDW 12.5 04/25/2021 0413   RDW 13.2 07/09/2018 0836   LYMPHSABS 0.8 07/09/2018 0836   MONOABS 0.8 04/18/2015 1930   EOSABS 0.1 07/09/2018 0836   BASOSABS 0.0 07/09/2018 0836    Hgb A1C Lab Results  Component Value Date   HGBA1C 5.3 09/07/2021           Assessment & Plan:   Oral Thrush:  Rx for Nystatin swish and spit 5 mils 4 times daily  Folliculitis [place]:  Advised him to stop shaving until lesions resolve Rx for Mupirocin twice daily  RTC in 1 month for your annual exam [name], NP

## 2022-01-01 NOTE — Telephone Encounter (Signed)
Needs an appointment for evaluation

## 2022-01-01 NOTE — Patient Instructions (Signed)
Oral Thrush, Adult Oral thrush is an infection in your mouth and throat and on your tongue. It causes white patches to form in your mouth and on your tongue. Many cases of thrush are mild. But, sometimes, thrush can be serious. People who have a weak body defense system (immune system) or other diseases can be affected more. What are the causes? This condition is caused by a type of fungus called yeast. The fungus is normally present in small amounts in the mouth and nose. If a person has a long-term illness or a weak body defense system, the fungus can grow and spread quickly. This causes thrush. What increases the risk? You are more likely to develop this condition if: You have a weak body defense system. You are an older adult. You have diabetes, cancer, or HIV. You have a dry mouth. You are pregnant or breastfeeding. You do not take good care of your teeth. This risk is greater for people who have false teeth (dentures). You use antibiotic or steroid medicines. What are the signs or symptoms? Symptoms of this condition include: A burning feeling in the mouth and throat. White patches that stick to the mouth and tongue. A bad taste in the mouth or trouble tasting foods. A feeling like you have cotton in your mouth. Pain when you eat and swallow. Not wanting to eat as much as usual. Cracking at the corners of the mouth. How is this treated? This condition is treated with medicines called antifungals. These medicines prevent a fungus from growing. The medicines are either put right on the area (topical) or swallowed (oral). Your doctor will also treat other problems that you may have, such as diabetes or HIV. Follow these instructions at home: Helping with pain and soreness To lessen your pain: Drink cold liquids, like water and iced tea. Eat frozen ice pops or frozen juices. Eat foods that are easy to swallow, like gelatin and ice cream. Drink from a straw if you have too much pain  in your mouth.  General instructions Take or use over-the-counter and prescription medicines only as told by your doctor. Eat plain yogurt that has live cultures in it. Read the label to make sure that there are live cultures in your yogurt. If you wear false teeth: Take them out before you go to bed. Brush them well. Soak them in a cleaner. Rinse your mouth with warm salt-water many times a day. To make the salt-water mixture, dissolve -1 teaspoon (3-6 g) of salt in 1 cup (237 mL) of warm water. Contact a doctor if: Your problems do not get better within 7 days of treatment. Your infection is spreading. This may show as white areas on the skin outside of your mouth. You are nursing your baby and you have redness and pain in the nipples. Summary Oral thrush is an infection in your mouth and throat. It is caused by a fungus. You are more likely to get this condition if you have a weak body defense system. Diseases like diabetes, cancer, or HIV also add to your risk. This condition is treated with medicines called antifungals. Contact a doctor if you do not get better within 7 days of starting treatment. This information is not intended to replace advice given to you by your health care provider. Make sure you discuss any questions you have with your health care provider. Document Revised: 10/11/2020 Document Reviewed: 12/18/2018 Elsevier Patient Education  [place].

## 2022-01-02 ENCOUNTER — Telehealth: Payer: Self-pay | Admitting: Internal Medicine

## 2022-01-02 ENCOUNTER — Other Ambulatory Visit: Payer: Self-pay

## 2022-01-02 MED ORDER — NYSTATIN 100000 UNIT/ML MT SUSP: 5.0000 mL | Freq: Four times a day (QID) | OROMUCOSAL | 0 refills | Status: DC

## 2022-01-02 NOTE — Telephone Encounter (Signed)
Pt is calling in stating that his medication nystation (MYCOSTATIN) 100000 UNIT/ML suspension went to the incorrect pharmacy and would like for it to be sent to Phar:  [facility] [address] in [place], [place] (pt state that this is the only pharmacy that he uses).  Pt would like to have a call once it has been called in to the pharmacy.

## 2022-01-02 NOTE — Telephone Encounter (Signed)
RX sent to the correct pharmacy.  PT advised.  (See My Chart Message)   Thanks,  -Mickel Baas

## 2022-01-07 ENCOUNTER — Encounter (INDEPENDENT_AMBULATORY_CARE_PROVIDER_SITE_OTHER): Payer: Self-pay | Admitting: Gastroenterology

## 2022-01-11 ENCOUNTER — Other Ambulatory Visit: Payer: Self-pay | Admitting: Internal Medicine

## 2022-01-11 NOTE — Telephone Encounter (Signed)
Requested medications are due for refill today.  Unsure  Requested medications are on the active medications list.  yes  Last refill. 01/01/2022 22g 0 rf  Future visit scheduled.   yes  Notes to clinic.  Refill not delegated.    Requested Prescriptions  Pending Prescriptions Disp Refills   mupirocin ointment (BACTROBAN) 2 % [Pharmacy Med Name: MUPIROCIN 2% OINTMENT] 22 g 0    Sig: APPLY TO AFFECTED AREA TWICE A DAY     Off-Protocol Failed - 01/11/2022 11:03 AM      Failed - Medication not assigned to a protocol, review manually.      Passed - Valid encounter within last 12 months    Recent Outpatient Visits           1 week ago Oral thrush   [facility] [place], [place], NP   4 months ago Bipolar disorder, in full remission, most recent episode mixed [facility])   [facility], Coralie Keens, NP   7 months ago Acute nasopharyngitis   [facility] [place], [place], NP   8 months ago Acute gallstone pancreatitis   [facility] [place], Coralie Keens, NP   11 months ago Encounter for general adult medical examination with abnormal findings   [facility], Coralie Keens, NP       Future Appointments             In 1 month Baity, Coralie Keens, NP [facility], [facility]

## 2022-01-23 ENCOUNTER — Telehealth (INDEPENDENT_AMBULATORY_CARE_PROVIDER_SITE_OTHER): Payer: 59 | Admitting: Psychiatry

## 2022-01-23 ENCOUNTER — Encounter: Payer: Self-pay | Admitting: Psychiatry

## 2022-01-23 DIAGNOSIS — G4726 Circadian rhythm sleep disorder, shift work type: Secondary | ICD-10-CM

## 2022-01-23 DIAGNOSIS — F3178 Bipolar disorder, in full remission, most recent episode mixed: Secondary | ICD-10-CM

## 2022-01-23 DIAGNOSIS — G4710 Hypersomnia, unspecified: Secondary | ICD-10-CM | POA: Diagnosis not present

## 2022-01-23 MED ORDER — ARMODAFINIL 150 MG PO TABS: 75.0000 mg | ORAL_TABLET | Freq: Every day | ORAL | 5 refills | Status: DC

## 2022-01-23 MED ORDER — TEMAZEPAM 30 MG PO CAPS: 30.0000 mg | ORAL_CAPSULE | Freq: Every evening | ORAL | 2 refills | Status: DC | PRN

## 2022-01-23 NOTE — Progress Notes (Unsigned)
Virtual Visit via Video Note  I connected with Dale Davis on 01/23/22 at  3:00 PM EST by a video enabled telemedicine application and verified that I am speaking with the correct person using two identifiers.  Location Provider Location : [facility] Patient Location : Work  Participants: Patient , Provider    I discussed the limitations of evaluation and management by telemedicine and the availability of in person appointments. The patient expressed understanding and agreed to proceed.    I discussed the assessment and treatment plan with the patient. The patient was provided an opportunity to ask questions and all were answered. The patient agreed with the plan and demonstrated an understanding of the instructions.   The patient was advised to call back or seek an in-person evaluation if the symptoms worsen or if the condition fails to improve as anticipated.   [place] MD OP Progress Note  01/24/2022 11:05 AM JERRED ZAREMBA  MRN:  [id number]  Chief Complaint:  Chief Complaint  Patient presents with   Follow-up   Anxiety   Insomnia   HPI: ROCHESTER SERPE is a 46 year old Caucasian male, married, employed, lives in [place], has a history of bipolar disorder, shift work disorder, insomnia was evaluated by telemedicine today.  Patient today reports since being on the higher dosage of temazepam he has been sleeping well.  He reports mood symptoms are currently stable.  Denies any manic, hypomanic, depression symptoms.  Denies any anxiety.  Denies any suicidality, homicidality or perceptual disturbances.  Patient appeared to be alert, oriented to person place time and situation.  Reports compliance with medications.  Denies side effects.  Reports work is going well.  Reports he enjoyed Thanksgiving holiday.  Patient denies any other concerns today.  Visit Diagnosis:    ICD-10-CM   1. Bipolar disorder, in full remission, most recent episode mixed ([place])  F31.78     2.  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 Hypersomnia  G47.10       Past Psychiatric History: Reviewed past psychiatric history from progress note on 06/26/2018.  Past trials of medications like Symbyax, trazodone, Klonopin, Wellbutrin, Lunesta, Adderall, Chantix, Prozac, propranolol, Zyprexa, Ambien, Sonata  Past Medical History:  Past Medical History:  Diagnosis Date   Anxiety    Depression    History of kidney stones    Kidney stones    Kidney stones     Past Surgical History:  Procedure Laterality Date   CHOLECYSTECTOMY N/A 04/26/2021   Procedure: LAPAROSCOPIC CHOLECYSTECTOMY;  Surgeon: Rusty Aus, DO;  Location: [facility];  Service: General;  Laterality: N/A;   COLONOSCOPY WITH PROPOFOL N/A 08/24/2021   Procedure: COLONOSCOPY WITH PROPOFOL;  Surgeon: [name], MD;  Location[facility] ENDOSCOPY;  Service: Gastroenterology;  Laterality: N/A;   ERCP N/A 04/24/2021   Procedure: ENDOSCOPIC RETROGRADE CHOLANGIOPANCREATOGRAPHY (ERCP);  Surgeon: Rogene Houston, MD;  Location: [facility];  Service: Gastroenterology;  Laterality: N/A;   ESOPHAGEAL DILATION N/A 04/24/2021   Procedure: ESOPHAGEAL DILATION;  Surgeon: Rogene Houston, MD;  Location: [facility];  Service: Gastroenterology;  Laterality: N/A;   ESOPHAGOGASTRODUODENOSCOPY (EGD) WITH PROPOFOL N/A 04/24/2021   Procedure: ESOPHAGOGASTRODUODENOSCOPY (EGD) WITH PROPOFOL;  Surgeon: Rogene Houston, MD;  Location: [facility];  Service: Gastroenterology;  Laterality: N/A;   TOE AMPUTATION Right 03/29/2015   big toe    Family Psychiatric History: Reviewed family History from progress note on 06/26/2018.  Family History:  Family History  Problem Relation Age of Onset   Kidney failure Father     Social History: Reviewed social history from progress note on 06/26/2018. 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  Tobacco Use   Smoking status: Never   Smokeless tobacco: Former    Types: Chew    Quit date: 06/2020  Vaping Use   Vaping Use: Never used  Substance and Sexual Activity   Alcohol use: Yes    Alcohol/week: 1.0 standard drink of alcohol    Types: 1 Standard drinks or equivalent per week    Comment: occassional   Drug use: No   Sexual activity: Yes  Other Topics Concern   Not on file  Social History Narrative   Not on file   Social Determinants of Health   Financial Resource Strain: Low Risk  (06/26/2018)   Overall Financial Resource Strain (CARDIA)    Difficulty of Paying Living Expenses: Not hard at all  Food Insecurity: No Food Insecurity (06/26/2018)   Hunger Vital Sign    Worried About Running Out of Food in the Last Year: Never true    [place] in the Last Year: Never true  Transportation Needs: No Transportation Needs (06/26/2018)   PRAPARE - Hydrologist (Medical): No    Lack of Transportation (Non-Medical): No  Physical Activity: Insufficiently Active (06/26/2018)   Exercise Vital Sign    Days of Exercise per Week: 7 days    Minutes of Exercise per Session: 20 min  Stress: No Stress Concern Present (06/26/2018)   [place]    Feeling of Stress : Only a little  Social Connections: Somewhat Isolated (06/26/2018)   Social Connection and Isolation Panel [NHANES]    Frequency of Communication with Friends and Family: Twice a week    Frequency of Social Gatherings with Friends and Family: Twice a week    Attends Religious Services: Never    Marine scientist or Organizations: No    Attends Archivist Meetings: Never    Marital Status: Married    Allergies:  Allergies  Allergen Reactions    Flomax [Tamsulosin Hcl] Rash    Metabolic Disorder Labs: Lab Results  Component Value Date   HGBA1C 5.3 09/07/2021   MPG 108 02/11/2020   Lab Results  Component Value Date   PROLACTIN 10.7 06/06/2020   PROLACTIN 6.5 07/15/2019   Lab Results  Component Value Date   CHOL 308 (A) 09/07/2021   TRIG 207 (A) 09/07/2021   HDL 42 09/07/2021   CHOLHDL 6.5 04/23/2021   VLDL 28 04/23/2021   LDLCALC 225 09/07/2021   LDLCALC 193 (H) 04/23/2021   Lab Results  Component Value Date   TSH 1.49 06/24/2019   TSH 1.830 07/09/2018    Therapeutic Level Labs: No results found for: "LITHIUM" No results found for: "VALPROATE" No results found for: "CBMZ"  Current Medications: Current Outpatient Medications  Medication Sig Dispense Refill   atorvastatin (LIPITOR) 20 MG tablet Take 1 tablet (20 mg total) by mouth daily. 90 tablet 1   hydrOXYzine (VISTARIL) 25 MG capsule TAKE 1-2 CAPSULES BY MOUTH AT  BEDTIME AS NEEDED. FOR SEVERE ANXIETY , RACING THOUGHTS, SLEEP (Patient taking differently: Take 25-50 mg by mouth at bedtime as needed for anxiety.) 180 capsule 1   Armodafinil 150 MG tablet Take 0.5 tablets (75 mg total) by mouth daily. TAKE 1/2 TABLET (75 MG TOTAL) BY MOUTH DAILY. Strength: 150 mg 15 tablet 5   mupirocin ointment (BACTROBAN) 2 % APPLY TO AFFECTED AREA TWICE A DAY (Patient not taking: Reported on 01/23/2022) 22 g 0   nystatin (MYCOSTATIN) 100000 UNIT/ML suspension Take 5 mLs (500,000 Units total) by mouth 4 (four) times daily. (Patient not taking: Reported on 01/23/2022) 240 mL 0   temazepam (RESTORIL) 30 MG capsule Take 1 capsule (30 mg total) by mouth at bedtime as needed for sleep. 30 capsule 2   No current facility-administered medications for this visit.     Musculoskeletal: Strength & Muscle Tone:  UTA Gait & Station:  Seated Patient leans: N/A  Psychiatric Specialty Exam: Review of Systems  Psychiatric/Behavioral: Negative.    All other systems reviewed and are  negative.   There were no vitals taken for this visit.There is no height or weight on file to calculate BMI.  General Appearance: Casual  Eye Contact:  Fair  Speech:  Normal Rate  Volume:  Normal  Mood:  Euthymic  Affect:  Congruent  Thought Process:  Goal Directed and Descriptions of Associations: Intact  Orientation:  Full (Time, Place, and Person)  Thought Content: Logical   Suicidal Thoughts:  No  Homicidal Thoughts:  No  Memory:  Immediate;   Fair Recent;   Fair Remote;   Fair  Judgement:  Fair  Insight:  Fair  Psychomotor Activity:  Normal  Concentration:  Concentration: Fair and Attention Span: Fair  Recall:  [employer] of Knowledge: Fair  Language: Fair  Akathisia:  No  Handed:  Right  AIMS (if indicated): not done  Assets:  Communication Skills Desire for Improvement Housing Social Support  ADL's:  Intact  Cognition: WNL  Sleep:  Fair   Screenings: [place] Office Visit from 08/16/2020 in [place] Total Score 0      GAD-7    Flowsheet Row Video Visit from 01/23/2022 in [place] Video Visit from 11/14/2021 in [place] Office Visit from 05/11/2021 in [facility] Video Visit from 12/13/2020 in [place] Office Visit from 08/16/2020 in [place]  Total GAD-7 Score 0 0 0 0 0      PHQ2-9    Flowsheet Row Video Visit from 01/23/2022 in [place] Office Visit from 01/01/2022 in [facility] Video Visit from 12/31/2021 in [place] Video Visit from 11/14/2021 in [place] Office Visit from 05/11/2021 in [place]  PHQ-2 Total Score 0 0 0 0 0  PHQ-9 Total Score -- -- -- -- 0      Flowsheet Row Video Visit from 01/23/2022 in [place] Video Visit from 12/31/2021 in [place] Video Visit from 11/14/2021 in [place] No Risk No Risk No Risk        Assessment and Plan: Dale Davis is a 46 year old Caucasian male, married, employed, lives in [place], has a history of bipolar disorder, shift work disorder, insomnia, hypersomnia was evaluated by telemedicine today.  Patient today reports since being on the  higher doses of temazepam he has been sleeping better, denies any fatigue or excessive sleepiness during the day.  Discussed plan as noted below.  Plan Bipolar disorder in remission Will monitor closely. Hydroxyzine 25-50 mg p.o. nightly as needed available however he has not been using it.  Shift work disorder-stable Armodafinil 150 mg p.o. daily-takes half to 1 tablet daily. Reviewed [place] PMP AWARxE  Insomnia/hypersomnia-improved Continue temazepam 30 mg p.o. nightly for sleep at night Will consider referring this patient for sleep study in the future if he continues to have sleep issues.   Follow-up in clinic in 3 to 4 months or sooner if needed.   Consent: Patient/Guardian gives verbal consent for treatment and assignment of benefits for services provided during this visit. Patient/Guardian expressed understanding and agreed to proceed.   This note was generated in part or whole with voice recognition software. Voice recognition is usually quite accurate but there are transcription errors that can and very often do occur. I apologize for any typographical errors that were not detected and corrected.      Ursula Alert, MD 01/24/2022, 11:05 AM

## 2022-01-25 ENCOUNTER — Telehealth: Payer: Self-pay

## 2022-01-25 NOTE — Telephone Encounter (Signed)
received email from [email or web address] that a priot at was needed for the armodafinil

## 2022-01-25 NOTE — Telephone Encounter (Signed)
went on [email or web address] and submitted the prior auth.- pending

## 2022-02-21 ENCOUNTER — Ambulatory Visit (INDEPENDENT_AMBULATORY_CARE_PROVIDER_SITE_OTHER): Payer: Managed Care, Other (non HMO) | Admitting: Internal Medicine

## 2022-02-21 ENCOUNTER — Encounter: Payer: Self-pay | Admitting: Internal Medicine

## 2022-02-21 ENCOUNTER — Telehealth: Payer: 59 | Admitting: Psychiatry

## 2022-02-21 VITALS — BP 117/74 | HR 80 | Temp 97.8°F | Resp 16 | Ht 71.0 in | Wt 215.0 lb

## 2022-02-21 DIAGNOSIS — E782 Mixed hyperlipidemia: Secondary | ICD-10-CM | POA: Diagnosis not present

## 2022-02-21 DIAGNOSIS — R7309 Other abnormal glucose: Secondary | ICD-10-CM

## 2022-02-21 DIAGNOSIS — E663 Overweight: Secondary | ICD-10-CM | POA: Diagnosis not present

## 2022-02-21 DIAGNOSIS — Z6829 Body mass index (BMI) 29.0-29.9, adult: Secondary | ICD-10-CM

## 2022-02-21 DIAGNOSIS — Z0001 Encounter for general adult medical examination with abnormal findings: Secondary | ICD-10-CM

## 2022-02-21 NOTE — Assessment & Plan Note (Signed)
Encourage diet and exercise for weight loss 

## 2022-02-21 NOTE — Progress Notes (Signed)
Subjective:    Patient ID: Dale Davis, male    DOB: May 15, 1975, 46 y.o.   MRN: [id number]  HPI  Patient presents to clinic today for his annual exam.  Flu: Never Tetanus: 10/2015 COVID: Pfizer x 2 Colon screening: 07/2021 Vision screening: as needed Dentist: biannually  Diet: He does eat meat. He consumes some fruits and veggies. He does eat some fried foods. He drinks mostly soda. Exercise: None  Review of Systems  Past Medical History:  Diagnosis Date   Anxiety    Depression    History of kidney stones    Kidney stones    Kidney stones     Current Outpatient Medications  Medication Sig Dispense Refill   Armodafinil 150 MG tablet Take 0.5 tablets (75 mg total) by mouth daily. TAKE 1/2 TABLET (75 MG TOTAL) BY MOUTH DAILY. Strength: 150 mg 15 tablet 5   atorvastatin (LIPITOR) 20 MG tablet Take 1 tablet (20 mg total) by mouth daily. 90 tablet 1   hydrOXYzine (VISTARIL) 25 MG capsule TAKE 1-2 CAPSULES BY MOUTH AT BEDTIME AS NEEDED. FOR SEVERE ANXIETY , RACING THOUGHTS, SLEEP (Patient taking differently: Take 25-50 mg by mouth at bedtime as needed for anxiety.) 180 capsule 1   mupirocin ointment (BACTROBAN) 2 % APPLY TO AFFECTED AREA TWICE A DAY (Patient not taking: Reported on 01/23/2022) 22 g 0   nystatin (MYCOSTATIN) 100000 UNIT/ML suspension Take 5 mLs (500,000 Units total) by mouth 4 (four) times daily. (Patient not taking: Reported on 01/23/2022) 240 mL 0   temazepam (RESTORIL) 30 MG capsule Take 1 capsule (30 mg total) by mouth at bedtime as needed for sleep. 30 capsule 2   No current facility-administered medications for this visit.    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  Tobacco Use   Smoking status: Never   Smokeless tobacco: Former    Types: Chew    Quit date: 06/2020  Vaping Use   Vaping Use: Never used  Substance and Sexual Activity   Alcohol use: Yes    Alcohol/week: 1.0 standard drink of alcohol    Types: 1 Standard drinks or equivalent per week    Comment: occassional   Drug use: No   Sexual activity: Yes  Other Topics Concern   Not on file  Social History Narrative   Not on file   Social Determinants of Health   Financial Resource Strain: Low Risk  (06/26/2018)   Overall Financial Resource Strain (CARDIA)    Difficulty of Paying Living Expenses: Not hard at all  Food Insecurity: No Food Insecurity (06/26/2018)   Hunger Vital Sign    Worried About Running Out of Food in the Last Year: Never true    [place] in the Last Year: Never true  Transportation Needs: No Transportation Needs (06/26/2018)   PRAPARE - Hydrologist (Medical): No    Lack of Transportation (Non-Medical): No  Physical Activity: Insufficiently Active (06/26/2018)   Exercise Vital Sign    Days of Exercise  per Week: 7 days    Minutes of Exercise per Session: 20 min  Stress: No Stress Concern Present (06/26/2018)   [place]    Feeling of Stress : Only a little  Social Connections: Somewhat Isolated (06/26/2018)   Social Connection and Isolation Panel [NHANES]    Frequency of Communication with Friends and Family: Twice a week    Frequency of Social Gatherings with Friends and Family: Twice a week    Attends Religious Services: Never    Marine scientist or Organizations: No    Attends Archivist Meetings: Never    Marital Status: Married  Human resources officer Violence: Not At Risk (06/26/2018)   Humiliation, Afraid, Rape, and Kick questionnaire    Fear of Current  or Ex-Partner: No    Emotionally Abused: No    Physically Abused: No    Sexually Abused: No     Constitutional: Denies fever, malaise, fatigue, headache or abrupt weight changes.  HEENT: Denies eye pain, eye redness, ear pain, ringing in the ears, wax buildup, runny nose, nasal congestion, bloody nose, or sore throat. Respiratory: Denies difficulty breathing, shortness of breath, cough or sputum production.   Cardiovascular: Denies chest pain, chest tightness, palpitations or swelling in the hands or feet.  Gastrointestinal: Denies abdominal pain, bloating, constipation, diarrhea or blood in the stool.  GU: Patient reports erectile dysfunction.  Denies urgency, frequency, pain with urination, burning sensation, blood in urine, odor or discharge. Musculoskeletal: Denies decrease in range of motion, difficulty with gait, muscle pain or joint pain and swelling.  Skin: Denies redness, rashes, lesions or ulcercations.  Neurological: Patient reports insomnia.  Denies dizziness, difficulty with memory, difficulty with speech or problems with balance and coordination.  Psych: Patient has a history of depression.  Denies anxiety, SI/HI.  No other specific complaints in a complete review of systems (except as listed in HPI above).     Objective:   Physical Exam  BP 117/74 (BP Location: Right Arm, Patient Position: Sitting, Cuff Size: Large)   Pulse 80   Temp 97.8 F (36.6 C) (Temporal)   Resp 16   Ht _0  (1.803 m)   Wt 215 lb (97.5 kg)   SpO2 100%   BMI 29.99 kg/m   Wt Readings from Last 3 Encounters:  01/01/22 215 lb (97.5 kg)  08/24/21 207 lb 5.1 oz (94 kg)  08/16/21 213 lb (96.6 kg)    General: Appears his stated age, overweight, in NAD. Skin: Warm, dry and intact.  HEENT: Head: normal shape and size; Eyes: sclera white, no icterus, conjunctiva pink, PERRLA and EOMs intact;  Neck:  Neck supple, trachea midline. No masses, lumps or thyromegaly present.  Cardiovascular: Normal  rate and rhythm. S1,S2 noted.  No murmur, rubs or gallops noted. No JVD or BLE edema.  Pulmonary/Chest: Normal effort and positive vesicular breath sounds. No respiratory distress. No wheezes, rales or ronchi noted.  Abdomen: Normal bowel sounds.  Musculoskeletal: Strength 5/5 BUE/BLE.  No difficulty with gait.  Neurological: Alert and oriented. Cranial nerves II-XII grossly intact. Coordination normal.  Psychiatric: Mood and affect normal. Behavior is normal. Judgment and thought content normal.   BMET    Component Value Date/Time   NA 136 04/27/2021 0525   NA 142 05/26/2020 0814   K 3.8 04/27/2021 0525   CL 102 04/27/2021 0525   CO2 27 04/27/2021 0525   GLUCOSE 117 (H) 04/27/2021 0525   BUN 13 04/27/2021 0525  BUN 24 05/26/2020 0814   CREATININE 0.9 09/07/2021 0000   CREATININE 0.76 04/27/2021 0525   CREATININE 1.08 02/11/2020 1505   CALCIUM 9.0 04/27/2021 0525   GFRNONAA >60 04/27/2021 0525   GFRAA 89 07/09/2018 0836    Lipid Panel     Component Value Date/Time   CHOL 308 (A) 09/07/2021 0000   CHOL 169 05/26/2020 0814   TRIG 207 (A) 09/07/2021 0000   HDL 42 09/07/2021 0000   HDL 37 (L) 05/26/2020 0814   CHOLHDL 6.5 04/23/2021 0325   VLDL 28 04/23/2021 0325   LDLCALC 225 09/07/2021 0000   LDLCALC 115 (H) 05/26/2020 0814   LDLCALC 196 (H) 02/11/2020 1505    CBC    Component Value Date/Time   WBC 8.9 04/25/2021 0413   RBC 4.49 04/25/2021 0413   HGB 13.7 04/25/2021 0413   HGB 14.0 07/09/2018 0836   HCT 40.4 04/25/2021 0413   HCT 41.0 07/09/2018 0836   PLT 154 04/25/2021 0413   PLT 174 07/09/2018 0836   MCV 90.0 04/25/2021 0413   MCV 91 07/09/2018 0836   MCH 30.5 04/25/2021 0413   MCHC 33.9 04/25/2021 0413   RDW 12.5 04/25/2021 0413   RDW 13.2 07/09/2018 0836   LYMPHSABS 0.8 07/09/2018 0836   MONOABS 0.8 04/18/2015 1930   EOSABS 0.1 07/09/2018 0836   BASOSABS 0.0 07/09/2018 0836    Hgb A1C Lab Results  Component Value Date   HGBA1C 5.3 09/07/2021             Assessment & Plan:   Preventative Health Maintenance:  He declines flu shot today Tetanus UTD Encouraged him to get his COVID booster Colon screening UTD Encouraged him to consume a balanced diet and exercise regimen Advised him to see an eye doctor and dentist annually We will check CBC, c-Met, lipid and A1c today  RTC in 6 months, follow-up chronic conditions [name], NP

## 2022-02-21 NOTE — Patient Instructions (Signed)
Health Maintenance After Age 47 After age 47, you are at a higher risk for certain long-term diseases and infections as well as injuries from falls. Falls are a major cause of broken bones and head injuries in people who are older than age 47. Getting regular preventive care can help to keep you healthy and well. Preventive care includes getting regular testing and making lifestyle changes as recommended by your health care provider. Talk with your health care provider about: Which screenings and tests you should have. A screening is a test that checks for a disease when you have no symptoms. A diet and exercise plan that is right for you. What should I know about screenings and tests to prevent falls? Screening and testing are the best ways to find a health problem early. Early diagnosis and treatment give you the best chance of managing medical conditions that are common after age 47. Certain conditions and lifestyle choices may make you more likely to have a fall. Your health care provider may recommend: Regular vision checks. Poor vision and conditions such as cataracts can make you more likely to have a fall. If you wear glasses, make sure to get your prescription updated if your vision changes. Medicine review. Work with your health care provider to regularly review all of the medicines you are taking, including over-the-counter medicines. Ask your health care provider about any side effects that may make you more likely to have a fall. Tell your health care provider if any medicines that you take make you feel dizzy or sleepy. Strength and balance checks. Your health care provider may recommend certain tests to check your strength and balance while standing, walking, or changing positions. Foot health exam. Foot pain and numbness, as well as not wearing proper footwear, can make you more likely to have a fall. Screenings, including: Osteoporosis screening. Osteoporosis is a condition that causes  the bones to get weaker and break more easily. Blood pressure screening. Blood pressure changes and medicines to control blood pressure can make you feel dizzy. Depression screening. You may be more likely to have a fall if you have a fear of falling, feel depressed, or feel unable to do activities that you used to do. Alcohol use screening. Using too much alcohol can affect your balance and may make you more likely to have a fall. Follow these instructions at home: Lifestyle Do not drink alcohol if: Your health care provider tells you not to drink. If you drink alcohol: Limit how much you have to: 0-1 drink a day for women. 0-2 drinks a day for men. Know how much alcohol is in your drink. In the [place] one drink equals one 12 oz bottle of beer (355 mL), one 5 oz glass of wine (148 mL), or one 1 oz glass of hard liquor (44 mL). Do not use any products that contain nicotine or tobacco. These products include cigarettes, chewing tobacco, and vaping devices, such as e-cigarettes. If you need help quitting, ask your health care provider. Activity  Follow a regular exercise program to stay fit. This will help you maintain your balance. Ask your health care provider what types of exercise are appropriate for you. If you need a cane or walker, use it as recommended by your health care provider. Wear supportive shoes that have nonskid soles. Safety  Remove any tripping hazards, such as rugs, cords, and clutter. Install safety equipment such as grab bars in bathrooms and safety rails on stairs. Keep rooms and walkways   well-lit. General instructions Talk with your health care provider about your risks for falling. Tell your health care provider if: You fall. Be sure to tell your health care provider about all falls, even ones that seem minor. You feel dizzy, tiredness (fatigue), or off-balance. Take over-the-counter and prescription medicines only as told by your health care provider. These include  supplements. Eat a healthy diet and maintain a healthy weight. A healthy diet includes low-fat dairy products, low-fat (lean) meats, and fiber from whole grains, beans, and lots of fruits and vegetables. Stay current with your vaccines. Schedule regular health, dental, and eye exams. Summary Having a healthy lifestyle and getting preventive care can help to protect your health and wellness after age 47. Screening and testing are the best way to find a health problem early and help you avoid having a fall. Early diagnosis and treatment give you the best chance for managing medical conditions that are more common for people who are older than age 47. Falls are a major cause of broken bones and head injuries in people who are older than age 47. Take precautions to prevent a fall at home. Work with your health care provider to learn what changes you can make to improve your health and wellness and to prevent falls. This information is not intended to replace advice given to you by your health care provider. Make sure you discuss any questions you have with your health care provider. Document Revised: 07/03/2020 Document Reviewed: 07/03/2020 Elsevier Patient Education  2023 Elsevier Inc.  

## 2022-02-23 ENCOUNTER — Other Ambulatory Visit: Payer: Self-pay | Admitting: Psychiatry

## 2022-02-23 DIAGNOSIS — F3178 Bipolar disorder, in full remission, most recent episode mixed: Secondary | ICD-10-CM

## 2022-03-09 LAB — COMPREHENSIVE METABOLIC PANEL
ALT: 28 IU/L (ref 0–44)
AST: 22 IU/L (ref 0–40)
Albumin/Globulin Ratio: 2 (ref 1.2–2.2)
Albumin: 4.4 g/dL (ref 4.1–5.1)
Alkaline Phosphatase: 67 IU/L (ref 44–121)
BUN/Creatinine Ratio: 17 (ref 9–20)
BUN: 15 mg/dL (ref 6–24)
Bilirubin Total: 0.4 mg/dL (ref 0.0–1.2)
CO2: 26 mmol/L (ref 20–29)
Calcium: 9.4 mg/dL (ref 8.7–10.2)
Chloride: 104 mmol/L (ref 96–106)
Creatinine, Ser: 0.89 mg/dL (ref 0.76–1.27)
Globulin, Total: 2.2 g/dL (ref 1.5–4.5)
Glucose: 89 mg/dL (ref 70–99)
Potassium: 4 mmol/L (ref 3.5–5.2)
Sodium: 142 mmol/L (ref 134–144)
Total Protein: 6.6 g/dL (ref 6.0–8.5)
eGFR: 106 mL/min/{1.73_m2} (ref >59)

## 2022-03-09 LAB — LIPID PANEL
Chol/HDL Ratio: 5.7 ratio — ABNORMAL HIGH (ref 0.0–5.0)
Cholesterol, Total: 227 mg/dL — ABNORMAL HIGH (ref 100–199)
HDL: 40 mg/dL (ref >39)
LDL Chol Calc (NIH): 166 mg/dL — ABNORMAL HIGH (ref 0–99)
Triglycerides: 115 mg/dL (ref 0–149)
VLDL Cholesterol Cal: 21 mg/dL (ref 5–40)

## 2022-03-09 LAB — CBC
Hematocrit: 39.9 % (ref 37.5–51.0)
Hemoglobin: 13.7 g/dL (ref 13.0–17.7)
MCH: 30.9 pg (ref 26.6–33.0)
MCHC: 34.3 g/dL (ref 31.5–35.7)
MCV: 90 fL (ref 79–97)
Platelets: 178 10*3/uL (ref 150–450)
RBC: 4.44 x10E6/uL (ref 4.14–5.80)
RDW: 12.4 % (ref 11.6–15.4)
WBC: 3.3 10*3/uL — ABNORMAL LOW (ref 3.4–10.8)

## 2022-03-09 LAB — HEMOGLOBIN A1C
Est. average glucose Bld gHb Est-mCnc: 108 mg/dL
Hgb A1c MFr Bld: 5.4 % (ref 4.8–5.6)

## 2022-05-21 ENCOUNTER — Encounter: Payer: Self-pay | Admitting: Psychiatry

## 2022-05-21 ENCOUNTER — Telehealth (INDEPENDENT_AMBULATORY_CARE_PROVIDER_SITE_OTHER): Payer: 59 | Admitting: Psychiatry

## 2022-05-21 DIAGNOSIS — F3178 Bipolar disorder, in full remission, most recent episode mixed: Secondary | ICD-10-CM | POA: Diagnosis not present

## 2022-05-21 DIAGNOSIS — G4726 Circadian rhythm sleep disorder, shift work type: Secondary | ICD-10-CM

## 2022-05-21 DIAGNOSIS — G4710 Hypersomnia, unspecified: Secondary | ICD-10-CM | POA: Diagnosis not present

## 2022-05-21 NOTE — Progress Notes (Unsigned)
Virtual Visit via Video Note  I connected with Dale Davis on 05/21/22 at 10:40 AM EDT by a video enabled telemedicine application and verified that I am speaking with the correct person using two identifiers.  Location Provider Location : Remote Office Patient Location : Home  Participants: Patient , Provider    I discussed the limitations of evaluation and management by telemedicine and the availability of in person appointments. The patient expressed understanding and agreed to proceed.  I discussed the assessment and treatment plan with the patient. The patient was provided an opportunity to ask questions and all were answered. The patient agreed with the plan and demonstrated an understanding of the instructions.   The patient was advised to call back or seek an in-person evaluation if the symptoms worsen or if the condition fails to improve as anticipated.  [place] MD OP Progress Note  05/22/2022 8:11 AM Dale Davis  MRN:  [id number]  Chief Complaint:  Chief Complaint  Patient presents with   Follow-up   Medication Refill   sleep problems   Manic Behavior   HPI: Dale Davis is a 47 year old Caucasian male, married, employed, lives in [place], has a history of bipolar disorder, shift work disorder, insomnia was evaluated by telemedicine today.  Patient today reports overall mood symptoms are currently stable.  Reports sleep is overall okay.  Does not use the temazepam much.  Mostly uses hydroxyzine which does help him to sleep.  Currently continues to be taking armodafinil 75 mg daily.  Reports that does help with staying awake throughout the day.  Would like to come off of this medication as well ,however work has been lately busy and hence has not had a chance to skip it some days.  Has not been able to keep the sleep study referral.  Will let writer know if interested in the future.  Patient reports otherwise doing fairly well.  Denies any suicidality,  homicidality or perceptual disturbances.  Patient wonders whether he could get the armodafinil filled early this month since he is leaving town.  Patient denies any other concerns today.  Visit Diagnosis:    ICD-10-CM   1. 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 Hypersomnia  G47.10       Past Psychiatric History: I have reviewed past psychiatric history from progress note on 06/26/2018.  Past trials of medications like Symbyax, trazodone, Klonopin, Wellbutrin, Lunesta, Adderall, Chantix, Prozac, propranolol, Zyprexa, Ambien, Sonata.  Past Medical History:  Past Medical History:  Diagnosis Date   Anxiety    Depression    History of kidney stones    Kidney stones    Kidney stones     Past Surgical History:  Procedure Laterality Date   CHOLECYSTECTOMY N/A 04/26/2021   Procedure: LAPAROSCOPIC CHOLECYSTECTOMY;  Surgeon: Rusty Aus, DO;  Location: [facility];  Service: General;  Laterality: N/A;   COLONOSCOPY WITH PROPOFOL N/A 08/24/2021   Procedure: COLONOSCOPY WITH PROPOFOL;  Surgeon: [name], MD;  Location: [facility] ENDOSCOPY;  Service: Gastroenterology;  Laterality: N/A;   ERCP N/A 04/24/2021   Procedure: ENDOSCOPIC RETROGRADE CHOLANGIOPANCREATOGRAPHY (ERCP);  Surgeon: Rogene Houston, MD;  Location: [facility];  Service: Gastroenterology;  Laterality: N/A;   ESOPHAGEAL DILATION N/A 04/24/2021   Procedure: ESOPHAGEAL DILATION;  Surgeon: Rogene Houston, MD;  Location: [facility];  Service: Gastroenterology;  Laterality: N/A;   ESOPHAGOGASTRODUODENOSCOPY (  EGD) WITH PROPOFOL N/A 04/24/2021   Procedure: ESOPHAGOGASTRODUODENOSCOPY (EGD) WITH PROPOFOL;  Surgeon: Rogene Houston, MD;  Location: [facility];  Service: Gastroenterology;  Laterality: N/A;   TOE AMPUTATION Right 03/29/2015   big toe    Family Psychiatric History: Reviewed family psychiatric history from progress note on 06/26/2018.  Family  History:  Family History  Problem Relation Age of Onset   Kidney failure Father     Social History: Reviewed social history from progress note on 06/26/2018. 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  Tobacco Use   Smoking status: Never   Smokeless tobacco: Former    Types: Chew    Quit date: 06/2020  Vaping Use   Vaping Use: Never used  Substance and Sexual Activity   Alcohol use: Yes    Alcohol/week: 1.0 standard drink of alcohol    Types: 1 Standard drinks or equivalent per week    Comment: occassional   Drug use: No   Sexual activity: Yes  Other Topics Concern   Not on file  Social History Narrative   Not on file   Social Determinants of Health   Financial Resource Strain: Low Risk  (06/26/2018)   Overall Financial Resource Strain (CARDIA)    Difficulty of Paying Living Expenses: Not hard at all  Food Insecurity: No Food Insecurity (06/26/2018)   Hunger Vital Sign    Worried About Running Out of Food in the Last Year: Never true    [place] in the Last Year: Never true  Transportation Needs: No Transportation Needs (06/26/2018)   PRAPARE - Hydrologist (Medical): No    Lack of Transportation (Non-Medical): No  Physical Activity: Insufficiently Active (06/26/2018)   Exercise Vital Sign    Days of Exercise per Week: 7 days    Minutes of Exercise per Session: 20 min  Stress: No Stress Concern Present (06/26/2018)   [place]    Feeling of Stress : Only a little  Social Connections: Somewhat Isolated (06/26/2018)   Social Connection and Isolation Panel [NHANES]    Frequency of Communication with Friends and Family: Twice a week     Frequency of Social Gatherings with Friends and Family: Twice a week    Attends Religious Services: Never    Marine scientist or Organizations: No    Attends Archivist Meetings: Never    Marital Status: Married    Allergies:  Allergies  Allergen Reactions   Flomax [Tamsulosin Hcl] Rash    Metabolic Disorder Labs: Lab Results  Component Value Date   HGBA1C 5.4 03/08/2022   MPG 108 02/11/2020   Lab Results  Component Value Date   PROLACTIN 10.7 06/06/2020   PROLACTIN 6.5 07/15/2019   Lab Results  Component Value Date   CHOL 227 (H) 03/08/2022   TRIG 115 03/08/2022   HDL 40 03/08/2022   CHOLHDL 5.7 (H) 03/08/2022   VLDL 28 04/23/2021   LDLCALC 166 (H) 03/08/2022   LDLCALC 225 09/07/2021   Lab Results  Component Value Date   TSH 1.49 06/24/2019   TSH 1.830 07/09/2018    Therapeutic Level Labs: No results found for: "  LITHIUM" No results found for: "VALPROATE" No results found for: "CBMZ"  Current Medications: Current Outpatient Medications  Medication Sig Dispense Refill   Armodafinil 150 MG tablet Take 0.5 tablets (75 mg total) by mouth daily. TAKE 1/2 TABLET (75 MG TOTAL) BY MOUTH DAILY. Strength: 150 mg 15 tablet 5   atorvastatin (LIPITOR) 20 MG tablet Take 20 mg by mouth daily.     hydrOXYzine (VISTARIL) 25 MG capsule Take 25-50 mg by mouth at bedtime as needed for anxiety.     temazepam (RESTORIL) 30 MG capsule Take 1 capsule (30 mg total) by mouth at bedtime as needed for sleep. 30 capsule 2   No current facility-administered medications for this visit.     Musculoskeletal: Strength & Muscle Tone:  UTA Gait & Station:  Seated Patient leans: N/A  Psychiatric Specialty Exam: Review of Systems  Psychiatric/Behavioral: Negative.    All other systems reviewed and are negative.   There were no vitals taken for this visit.There is no height or weight on file to calculate BMI.  General Appearance: Casual  Eye Contact:  Fair  Speech:   Clear and Coherent  Volume:  Normal  Mood:  Euthymic  Affect:  Congruent  Thought Process:  Goal Directed and Descriptions of Associations: Intact  Orientation:  Full (Time, Place, and Person)  Thought Content: Logical   Suicidal Thoughts:  No  Homicidal Thoughts:  No  Memory:  Immediate;   Fair Recent;   Fair Remote;   Fair  Judgement:  Fair  Insight:  Fair  Psychomotor Activity:  Normal  Concentration:  Concentration: Fair and Attention Span: Fair  Recall:  [employer] of Knowledge: Fair  Language: Fair  Akathisia:  No  Handed:  Right  AIMS (if indicated): not done  Assets:  Communication Skills Desire for Improvement Housing Social Support  ADL's:  Intact  Cognition: WNL  Sleep:  Fair   Screenings: [place] Office Visit from 08/16/2020 in [place] Total Score 0      [place] Office Visit from 02/21/2022 in [facility] Video Visit from 01/23/2022 in [place] Video Visit from 11/14/2021 in [facility] Office Visit from 05/11/2021 in [facility] Video Visit from 12/13/2020 in [place]  Total GAD-7 Score 0 0 0 0 0      PHQ2-9    [place] Office Visit from 02/21/2022 in [facility] Video Visit from 01/23/2022 in [place] Office Visit from 01/01/2022 in [facility] Video Visit from 12/31/2021 in [place] Video Visit from 11/14/2021 in [place]  PHQ-2 Total Score 0 0 0 0 0  PHQ-9 Total Score 0 -- -- -- --      Flowsheet Row Video Visit from 05/21/2022 in [place] Video Visit from  01/23/2022 in [place] Video Visit from 12/31/2021 in [employer] No Risk No Risk No Risk        Assessment and Plan: Dale Davis is a 47 year old Caucasian male, married, employed, lives in [place], has a history of bipolar disorder, shift work disorder, insomnia, hypersomnia was evaluated by telemedicine today.  Patient is currently stable.  Plan Bipolar disorder in remission Will monitor closely. Hydroxyzine 25-50 mg p.o. nightly as needed.  Shift work disorder-stable Armodafinil 150 mg p.o., currently takes half to 1 tablet daily. Would like to taper it off.  Discussed referral for sleep study/sleep consultation.  Patient will let writer know. I have contacted [facility], spoke to pharmacist, patient to get an early refill this month.  Pharmacist agrees to send notification to patient once medication filled. Reviewed [place] PMP AWARxE  Insomnia/hypersomnia-some improvement Continue temazepam 30 mg p.o. nightly for sleep at night We will consider referral for sleep study-patient to let writer know if interested.  Follow-up in clinic in 5 months or sooner in person.   Consent: Patient/Guardian gives verbal consent for treatment and assignment of benefits for services provided during this visit. Patient/Guardian expressed understanding and agreed to proceed.   This note was generated in part or whole with voice recognition software. Voice recognition is usually quite accurate but there are transcription errors that can and very often do occur. I apologize for any typographical errors that were not detected and corrected.    Ursula Alert, MD 05/22/2022, 8:11 AM

## 2022-06-07 ENCOUNTER — Other Ambulatory Visit: Payer: Self-pay | Admitting: Psychiatry

## 2022-06-07 DIAGNOSIS — G4726 Circadian rhythm sleep disorder, shift work type: Secondary | ICD-10-CM

## 2022-07-12 ENCOUNTER — Telehealth: Payer: Self-pay

## 2022-07-12 NOTE — Telephone Encounter (Signed)
Received fax from [facility] stating that  the patients Armodafinil 150 mg was set to expire soon initiated PA through [facility]  Approved  Coverage 07/11/25-----01/11/26 Case ID [id number] Called patient to make aware spoke to patients wife she voiced understanding

## 2022-07-17 ENCOUNTER — Encounter: Payer: Self-pay | Admitting: Internal Medicine

## 2022-07-19 ENCOUNTER — Ambulatory Visit: Payer: Managed Care, Other (non HMO) | Admitting: Internal Medicine

## 2022-07-19 VITALS — BP 134/80 | HR 82 | Temp 96.9°F | Wt 203.0 lb

## 2022-07-19 DIAGNOSIS — N522 Drug-induced erectile dysfunction: Secondary | ICD-10-CM | POA: Diagnosis not present

## 2022-07-19 DIAGNOSIS — K219 Gastro-esophageal reflux disease without esophagitis: Secondary | ICD-10-CM | POA: Diagnosis not present

## 2022-07-19 DIAGNOSIS — F3178 Bipolar disorder, in full remission, most recent episode mixed: Secondary | ICD-10-CM | POA: Diagnosis not present

## 2022-07-19 DIAGNOSIS — R7309 Other abnormal glucose: Secondary | ICD-10-CM

## 2022-07-19 DIAGNOSIS — Z6828 Body mass index (BMI) 28.0-28.9, adult: Secondary | ICD-10-CM

## 2022-07-19 DIAGNOSIS — D709 Neutropenia, unspecified: Secondary | ICD-10-CM

## 2022-07-19 DIAGNOSIS — F5101 Primary insomnia: Secondary | ICD-10-CM

## 2022-07-19 DIAGNOSIS — E663 Overweight: Secondary | ICD-10-CM

## 2022-07-19 DIAGNOSIS — E782 Mixed hyperlipidemia: Secondary | ICD-10-CM

## 2022-07-19 DIAGNOSIS — L738 Other specified follicular disorders: Secondary | ICD-10-CM | POA: Insufficient documentation

## 2022-07-19 MED ORDER — DOXYCYCLINE HYCLATE 100 MG PO TABS: 100.0000 mg | ORAL_TABLET | Freq: Two times a day (BID) | ORAL | 0 refills | Status: DC

## 2022-07-19 MED ORDER — MUPIROCIN 2 % EX OINT: 1.0000 | TOPICAL_OINTMENT | Freq: Two times a day (BID) | CUTANEOUS | 0 refills | Status: DC

## 2022-07-19 NOTE — Progress Notes (Signed)
Subjective:    Patient ID: Dale Davis, male    DOB: 1975-04-03, 47 y.o.   MRN: [id number]  HPI  Patient presents to clinic today for follow-up of chronic conditions.  Bipolar Depression: Chronic, managed on Hydroxyzine and Armodafinil.  He follows with psychiatry but is not currently seeing a therapist.  He denies anxiety, SI/HI.  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  He is taking Temazepam as prescribed.  There is no sleep study on file.  ED: He is not currently taking any medications for this at this time.  He does not follow with urology.  HLD: His last LDL was 166, triglycerides 115, 01/2022.  He is taking a half tabletof Atorvastatin every other day.  He does not consume low-fat diet.  Neutropenia: His last WBC count was 3.3, 01/2022.  He does not follow with hematology.  GERD: He is not sure what triggers this.  He is taking a probiotic OTC.  Upper GI from 08/2021.  He also reports a rash of his face.  He noticed this for almost a years. He reports it occurs even when he doesn't shave. He uses a Public relations account executive, changes the head once every 2 weeks. He reports there is a lesion next to his right lower lip and the right side of his chin. He has tried Neosporin with some relief of symptoms.  Review of Systems     Past Medical History:  Diagnosis Date   Anxiety    Depression    History of kidney stones    Kidney stones    Kidney stones     Current Outpatient Medications  Medication Sig Dispense Refill   Armodafinil 150 MG tablet Take 0.5 tablets (75 mg total) by mouth daily. TAKE 1/2 TABLET (75 MG TOTAL) BY MOUTH DAILY. Strength: 150 mg 15 tablet 5   atorvastatin (LIPITOR) 20 MG tablet Take 20 mg by mouth daily.     hydrOXYzine (VISTARIL) 25 MG capsule Take 25-50 mg by mouth at bedtime as needed for anxiety.     temazepam (RESTORIL) 30 MG capsule TAKE 1 CAPSULE BY MOUTH AT BEDTIME AS NEEDED FOR SLEEP. 30 capsule 2   No current  facility-administered medications for this visit.    Allergies  Allergen Reactions   Flomax [Tamsulosin Hcl] Rash    Family History  Problem Relation Age of Onset   Kidney failure Father     Social History   Socioeconomic History   Marital status: Married    Spouse name: Sue Lush   Number of children: 0   Years of education: Not on file   Highest education level: Associate degree: occupational, Scientist, product/process development, or vocational program  Occupational History   Occupation: Psychologist, educational: Production assistant, radio FOR SELF EMPLOYED  Tobacco Use   Smoking status: Never   Smokeless tobacco: Former    Types: Chew    Quit date: 06/2020  Vaping Use   Vaping Use: Never used  Substance and Sexual Activity   Alcohol use: Yes    Alcohol/week: 1.0 standard drink of alcohol    Types: 1 Standard drinks or equivalent per week    Comment: occassional   Drug use: No   Sexual activity: Yes  Other Topics Concern   Not on file  Social History Narrative   Not on file   Social Determinants of Health   Financial Resource Strain: Medium Risk (07/19/2022)   Overall Financial Resource Strain (CARDIA)    Difficulty of Paying Living Expenses:  Somewhat hard  Food Insecurity: No Food Insecurity (07/19/2022)   Hunger Vital Sign    Worried About Running Out of Food in the Last Year: Never true    Ran Out of Food in the Last Year: Never true  Transportation Needs: No Transportation Needs (07/19/2022)   PRAPARE - Administrator, Civil Service (Medical): No    Lack of Transportation (Non-Medical): No  Physical Activity: Insufficiently Active (07/19/2022)   Exercise Vital Sign    Days of Exercise per Week: 3 days    Minutes of Exercise per Session: 10 min  Stress: No Stress Concern Present (07/19/2022)   [employer] of Occupational Health - Occupational Stress Questionnaire    Feeling of Stress : Not at all  Social Connections: Unknown (07/19/2022)   Social Connection and Isolation Panel  [NHANES]    Frequency of Communication with Friends and Family: Patient declined    Frequency of Social Gatherings with Friends and Family: Patient declined    Attends Religious Services: Patient declined    Database administrator or Organizations: Patient declined    Attends Banker Meetings: Not on file    Marital Status: Married  Intimate Partner Violence: Not At Risk (06/26/2018)   Humiliation, Afraid, Rape, and Kick questionnaire    Fear of Current or Ex-Partner: No    Emotionally Abused: No    Physically Abused: No    Sexually Abused: No     Constitutional: Denies fever, malaise, fatigue, headache or abrupt weight changes.  HEENT: Denies eye pain, eye redness, ear pain, ringing in the ears, wax buildup, runny nose, nasal congestion, bloody nose, or sore throat. Respiratory: Denies difficulty breathing, shortness of breath, cough or sputum production.   Cardiovascular: Denies chest pain, chest tightness, palpitations or swelling in the hands or feet.  Gastrointestinal: Denies abdominal pain, bloating, constipation, diarrhea or blood in the stool.  GU: Patient reports erectile dysfunction.  Denies urgency, frequency, pain with urination, burning sensation, blood in urine, odor or discharge. Musculoskeletal: Denies decrease in range of motion, difficulty with gait, muscle pain or joint pain and swelling.  Skin: Patient reports rash of face.  Denies ulcerations.  Neurological: Patient reports insomnia.  Denies dizziness, difficulty with memory, difficulty with speech or problems with balance and coordination.  Psych: Patient has a history of depression.  Denies anxiety, SI/HI.  No other specific complaints in a complete review of systems (except as listed in HPI above).  Objective:   Physical Exam   BP 134/80 (BP Location: Left Arm, Patient Position: Sitting, Cuff Size: Normal)   Pulse 82   Temp (!) 96.9 F (36.1 C) (Temporal)   Wt 203 lb (92.1 kg)   SpO2 98%   BMI  28.31 kg/m   Wt Readings from Last 3 Encounters:  02/21/22 215 lb (97.5 kg)  01/01/22 215 lb (97.5 kg)  08/24/21 207 lb 5.1 oz (94 kg)    General: Appears his stated age, overweight, in NAD. Skin: Warm, dry and intact. 1 cm scabbed lesion noted to right side of chin. Pustule noted adjacent to right lower lip.  HEENT: Head: normal shape and size; Eyes: sclera white, no icterus, conjunctiva pink, PERRLA and EOMs intact;  Cardiovascular: Normal rate and rhythm. S1,S2 noted.  No murmur, rubs or gallops noted. No JVD or BLE edema.  Pulmonary/Chest: Normal effort and positive vesicular breath sounds. No respiratory distress. No wheezes, rales or ronchi noted.  Abdomen: Soft and nontender. Normal bowel sounds.  Musculoskeletal:  No difficulty with gait.  Neurological: Alert and oriented. Coordination normal.  Psychiatric: Mood and affect normal. Behavior is normal. Judgment and thought content normal.    BMET    Component Value Date/Time   NA 142 03/08/2022 1259   K 4.0 03/08/2022 1259   CL 104 03/08/2022 1259   CO2 26 03/08/2022 1259   GLUCOSE 89 03/08/2022 1259   GLUCOSE 117 (H) 04/27/2021 0525   BUN 15 03/08/2022 1259   CREATININE 0.89 03/08/2022 1259   CREATININE 1.08 02/11/2020 1505   CALCIUM 9.4 03/08/2022 1259   GFRNONAA >60 04/27/2021 0525   GFRAA 89 07/09/2018 0836    Lipid Panel     Component Value Date/Time   CHOL 227 (H) 03/08/2022 1259   TRIG 115 03/08/2022 1259   HDL 40 03/08/2022 1259   CHOLHDL 5.7 (H) 03/08/2022 1259   CHOLHDL 6.5 04/23/2021 0325   VLDL 28 04/23/2021 0325   LDLCALC 166 (H) 03/08/2022 1259   LDLCALC 196 (H) 02/11/2020 1505    CBC    Component Value Date/Time   WBC 3.3 (L) 03/08/2022 1259   WBC 8.9 04/25/2021 0413   RBC 4.44 03/08/2022 1259   RBC 4.49 04/25/2021 0413   HGB 13.7 03/08/2022 1259   HCT 39.9 03/08/2022 1259   PLT 178 03/08/2022 1259   MCV 90 03/08/2022 1259   MCH 30.9 03/08/2022 1259   MCH 30.5 04/25/2021 0413   MCHC  34.3 03/08/2022 1259   MCHC 33.9 04/25/2021 0413   RDW 12.4 03/08/2022 1259   LYMPHSABS 0.8 07/09/2018 0836   MONOABS 0.8 04/18/2015 1930   EOSABS 0.1 07/09/2018 0836   BASOSABS 0.0 07/09/2018 0836    Hgb A1C Lab Results  Component Value Date   HGBA1C 5.4 03/08/2022           Assessment & Plan:       RTC in 7 months for annual exam Nicki Reaper, NP

## 2022-07-19 NOTE — Patient Instructions (Signed)

## 2022-07-19 NOTE — Assessment & Plan Note (Signed)
Rx for doxycycline 100 mg twice daily x 14 days Rx for Bactroban ointment twice daily Wash razor and alcohol after each use

## 2022-07-19 NOTE — Assessment & Plan Note (Signed)
Not medicated 

## 2022-07-19 NOTE — Assessment & Plan Note (Signed)
Continue probiotic OTC 

## 2022-07-19 NOTE — Assessment & Plan Note (Signed)
Encourage diet and exercise for weight loss 

## 2022-07-19 NOTE — Assessment & Plan Note (Signed)
CBC today.  

## 2022-07-19 NOTE — Assessment & Plan Note (Signed)
C-Met and lipid profile today Encouraged him to consume a low-fat diet Continue atorvastatin 

## 2022-07-19 NOTE — Assessment & Plan Note (Signed)
Continue temazepam 

## 2022-07-19 NOTE — Assessment & Plan Note (Signed)
Continue hydroxyzine and armodafinil as needed Support offered

## 2022-07-20 ENCOUNTER — Other Ambulatory Visit: Payer: Self-pay | Admitting: Internal Medicine

## 2022-07-20 LAB — HEMOGLOBIN A1C
Hgb A1c MFr Bld: 5.6 % of total Hgb (ref <5.7)
Mean Plasma Glucose: 114 mg/dL
eAG (mmol/L): 6.3 mmol/L

## 2022-07-20 LAB — CBC
HCT: 43.8 % (ref 38.5–50.0)
Hemoglobin: 14.8 g/dL (ref 13.2–17.1)
MCH: 30.8 pg (ref 27.0–33.0)
MCHC: 33.8 g/dL (ref 32.0–36.0)
MCV: 91.3 fL (ref 80.0–100.0)
MPV: 10.6 fL (ref 7.5–12.5)
Platelets: 183 10*3/uL (ref 140–400)
RBC: 4.8 10*6/uL (ref 4.20–5.80)
RDW: 13.1 % (ref 11.0–15.0)
WBC: 4.6 10*3/uL (ref 3.8–10.8)

## 2022-07-20 LAB — LIPID PANEL
Cholesterol: 190 mg/dL (ref <200)
HDL: 49 mg/dL (ref >=40)
LDL Cholesterol (Calc): 123 mg/dL (calc) — ABNORMAL HIGH
Non-HDL Cholesterol (Calc): 141 mg/dL (calc) — ABNORMAL HIGH (ref <130)
Total CHOL/HDL Ratio: 3.9 (calc) (ref <5.0)
Triglycerides: 79 mg/dL (ref <150)

## 2022-07-20 LAB — COMPLETE METABOLIC PANEL WITH GFR
AG Ratio: 1.8 (calc) (ref 1.0–2.5)
ALT: 20 U/L (ref 9–46)
AST: 14 U/L (ref 10–40)
Albumin: 4.4 g/dL (ref 3.6–5.1)
Alkaline phosphatase (APISO): 64 U/L (ref 36–130)
BUN: 21 mg/dL (ref 7–25)
CO2: 29 mmol/L (ref 20–32)
Calcium: 9.2 mg/dL (ref 8.6–10.3)
Chloride: 104 mmol/L (ref 98–110)
Creat: 0.96 mg/dL (ref 0.60–1.29)
Globulin: 2.5 g/dL (calc) (ref 1.9–3.7)
Glucose, Bld: 94 mg/dL (ref 65–99)
Potassium: 3.8 mmol/L (ref 3.5–5.3)
Sodium: 141 mmol/L (ref 135–146)
Total Bilirubin: 0.4 mg/dL (ref 0.2–1.2)
Total Protein: 6.9 g/dL (ref 6.1–8.1)
eGFR: 98 mL/min/{1.73_m2} (ref >=60)

## 2022-07-23 NOTE — Telephone Encounter (Signed)
Requested medications are due for refill today.  unsure  Requested medications are on the active medications list.  yes  Last refill. 05/19/2022  Future visit scheduled.   yes  Notes to clinic.  Medication listed as historical    Requested Prescriptions  Pending Prescriptions Disp Refills   atorvastatin (LIPITOR) 20 MG tablet [Pharmacy Med Name: ATORVASTATIN 20MG  TABLETS] 90 tablet     Sig: TAKE 1 TABLET BY MOUTH EVERY DAY     Cardiovascular:  Antilipid - Statins Failed - 07/20/2022 10:29 AM      Failed - Lipid Panel in normal range within the last 12 months    Cholesterol, Total  Date Value Ref Range Status  03/08/2022 227 (H) 100 - 199 mg/dL Final   Cholesterol  Date Value Ref Range Status  07/19/2022 190 <200 mg/dL Final   LDL Cholesterol (Calc)  Date Value Ref Range Status  07/19/2022 123 (H) mg/dL (calc) Final    Comment:    Reference range: <100 . Desirable range <100 mg/dL for primary prevention;   <70 mg/dL for patients with CHD or diabetic patients  with > or = 2 CHD risk factors. Marland Kitchen LDL-C is now calculated using the Martin-Hopkins  calculation, which is a validated novel method providing  better accuracy than the Friedewald equation in the  estimation of LDL-C.  Horald Pollen et al. Lenox Ahr. 1610;960(45): 2061-2068  (http://education.[employer].com/faq/FAQ164)    HDL  Date Value Ref Range Status  07/19/2022 49 > OR = 40 mg/dL Final  40/98/1191 40 >47 mg/dL Final   Triglycerides  Date Value Ref Range Status  07/19/2022 79 <150 mg/dL Final         Passed - Patient is not pregnant      Passed - Valid encounter within last 12 months    Recent Outpatient Visits           4 days ago Folliculitis barbae   [place] [facility] [place], Salvadore Oxford, NP   5 months ago Encounter for general adult medical examination with abnormal findings   [place] [facility] [place], Salvadore Oxford, NP   6 months ago Oral thrush   [place]  [facility] [place], [place] W, NP   11 months ago Bipolar disorder, in full remission, most recent episode mixed [facility])   [place] [facility] [place], Salvadore Oxford, NP   1 year ago Acute nasopharyngitis   [place] [facility] [place], Salvadore Oxford, NP       Future Appointments             In 7 months Ba[place], Salvadore Oxford, NP  [facility], [facility]

## 2022-07-27 ENCOUNTER — Other Ambulatory Visit: Payer: Self-pay | Admitting: Psychiatry

## 2022-07-27 DIAGNOSIS — G4726 Circadian rhythm sleep disorder, shift work type: Secondary | ICD-10-CM

## 2022-09-11 ENCOUNTER — Other Ambulatory Visit: Payer: Self-pay | Admitting: Psychiatry

## 2022-09-11 DIAGNOSIS — G4726 Circadian rhythm sleep disorder, shift work type: Secondary | ICD-10-CM

## 2022-09-12 ENCOUNTER — Other Ambulatory Visit (HOSPITAL_COMMUNITY): Payer: Self-pay | Admitting: Psychiatry

## 2022-09-12 ENCOUNTER — Telehealth: Payer: Self-pay

## 2022-09-12 DIAGNOSIS — G4726 Circadian rhythm sleep disorder, shift work type: Secondary | ICD-10-CM

## 2022-09-12 MED ORDER — TEMAZEPAM 30 MG PO CAPS
30.0000 mg | ORAL_CAPSULE | Freq: Every day | ORAL | 0 refills | Status: DC
Start: 2022-09-12 — End: 2022-10-15

## 2022-09-12 NOTE — Telephone Encounter (Signed)
Medication management - Telephone call with patient to inform Dr. Tenny Craw, covering for Dr. Elna Breslow who is out today, had sent in his requested new Temazepam order to his [facility] on [address].

## 2022-09-12 NOTE — Telephone Encounter (Signed)
sent 

## 2022-09-12 NOTE — Telephone Encounter (Signed)
Medication refill - Refill request for patient's Temazepam 30 mg capsules, last provided 06/10/22 + 2 refills and patient returns next to Dr. Elna Breslow on 09/16/22. Requests orders be sent into patient's [facility] on [address].

## 2022-09-16 ENCOUNTER — Ambulatory Visit: Payer: 59 | Admitting: Psychiatry

## 2022-09-16 ENCOUNTER — Encounter: Payer: Self-pay | Admitting: Psychiatry

## 2022-09-16 VITALS — BP 112/82 | HR 80 | Temp 98.3°F | Ht 71.0 in | Wt 202.2 lb

## 2022-09-16 DIAGNOSIS — F3178 Bipolar disorder, in full remission, most recent episode mixed: Secondary | ICD-10-CM | POA: Diagnosis not present

## 2022-09-16 DIAGNOSIS — G4710 Hypersomnia, unspecified: Secondary | ICD-10-CM

## 2022-09-16 DIAGNOSIS — G4726 Circadian rhythm sleep disorder, shift work type: Secondary | ICD-10-CM

## 2022-09-16 NOTE — Patient Instructions (Signed)
Bupropion Tablets (Depression/Mood Disorders) What is this medication? BUPROPION (byoo PROE pee on) treats depression. It increases norepinephrine and dopamine in the brain, hormones that help regulate mood. It belongs to a group of medications called NDRIs. This medicine may be used for other purposes; ask your health care provider or pharmacist if you have questions. COMMON BRAND NAME(S): Wellbutrin What should I tell my care team before I take this medication? They need to know if you have any of these conditions: An eating disorder, such as anorexia or bulimia Bipolar disorder or psychosis Diabetes or high blood sugar, treated with medication Glaucoma Head injury or brain tumor Heart disease, previous heart attack, or irregular heart beat High blood pressure Kidney disease Liver disease Seizures Suicidal thoughts, plans, or attempt by you or a family member Tourette syndrome Weight loss An unusual or allergic reaction to bupropion, other medications, foods, dyes, or preservatives Pregnant or trying to become pregnant Breastfeeding How should I use this medication? Take this medication by mouth with a glass of water. Follow the directions on the prescription label. You can take it with or without food. If it upsets your stomach, take it with food. Take your medication at regular intervals. Do not take your medication more often than directed. Do not stop taking this medication suddenly except upon the advice of your care team. Stopping this medication too quickly may cause serious side effects or your condition may worsen. A special MedGuide will be given to you by the pharmacist with each prescription and refill. Be sure to read this information carefully each time. Talk to your care team regarding the use of this medication in children. Special care may be needed. Overdosage: If you think you have taken too much of this medicine contact a poison control center or emergency room at  once. NOTE: This medicine is only for you. Do not share this medicine with others. What if I miss a dose? If you miss a dose, take it as soon as you can. If it is less than four hours to your next dose, take only that dose and skip the missed dose. Do not take double or extra doses. What may interact with this medication? Do not take this medication with any of the following: Linezolid MAOIs, such as Azilect, Carbex, Eldepryl, Marplan, Nardil, and Parnate Methylene blue (injected into a vein) Other medications that contain bupropion, such as Zyban This medication may also interact with the following: Alcohol Certain medications for anxiety or sleep Certain medications for blood pressure, such as metoprolol, propranolol Certain medications for HIV or AIDS, such as efavirenz, lopinavir, nelfinavir, ritonavir Certain medications for irregular heartbeat, such as propafenone, flecainide Certain medications for mental health conditions Certain medications for Parkinson disease, such as amantadine, levodopa Certain medications for seizures, such as carbamazepine, phenytoin, phenobarbital Cimetidine Clopidogrel Cyclophosphamide Digoxin Furazolidone Isoniazid Nicotine Orphenadrine Procarbazine Steroid medications, such as prednisone or cortisone Stimulant medications for attention disorders, weight loss, or to stay awake Tamoxifen Theophylline Thiotepa Ticlopidine Tramadol Warfarin This list may not describe all possible interactions. Give your health care provider a list of all the medicines, herbs, non-prescription drugs, or dietary supplements you use. Also tell them if you smoke, drink alcohol, or use illegal drugs. Some items may interact with your medicine. What should I watch for while using this medication? Tell your care team if your symptoms do not get better or if they get worse. Visit your care team for regular checks on your progress. Because it may take several  weeks to see  the full effects of this medication, it is important to continue your treatment as prescribed. This medication may cause thoughts of suicide or depression. This includes sudden changes in mood, behaviors, or thoughts. These changes can happen at any time but are more common in the beginning of treatment or after a change in dose. Call your care team right away if you experience these thoughts or worsening depression. This medication may cause mood and behavior changes, such as anxiety, nervousness, irritability, hostility, restlessness, excitability, hyperactivity, or trouble sleeping. These changes can happen at any time but are more common in the beginning of treatment or after a change in dose. Call your care team right away if you notice any of these symptoms. This medication may cause serious skin reactions. They can happen weeks to months after starting the medication. Contact your care team right away if you notice fevers or flu-like symptoms with a rash. The rash may be red or purple and then turn into blisters or peeling of the skin. You may also notice a red rash with swelling of the face, lips, or lymph nodes in your neck or under your arms. Avoid drinks that contain alcohol while taking this medication. Drinking large amounts of alcohol, using sleeping or anxiety medications, or quickly stopping the use of these agents while taking this medication may increase your risk for a seizure. This medication may affect your coordination, reaction time, or judgment. Do not drive or operate machinery until you know how this medication affects you. Do not take this medication close to bedtime. It may prevent you from sleeping. Your mouth may get dry. Chewing sugarless gum or sucking hard candy and drinking plenty of water may help. Contact your care team if the problem does not go away or is severe. What side effects may I notice from receiving this medication? Side effects that you should report to your  care team as soon as possible: Allergic reactions--skin rash, itching, hives, swelling of the face, lips, tongue, or throat Increase in blood pressure Mood and behavior changes--anxiety, nervousness, confusion, hallucinations, irritability, hostility, thoughts of suicide or self-harm, worsening mood, feelings of depression Redness, blistering, peeling, or loosening of the skin, including inside the mouth Seizures Sudden eye pain or change in vision such as blurry vision, seeing halos around lights, vision loss Side effects that usually do not require medical attention (report to your care team if they continue or are bothersome): Constipation Dizziness Dry mouth Loss of appetite Nausea Tremors or shaking Trouble sleeping This list may not describe all possible side effects. Call your doctor for medical advice about side effects. You may report side effects to FDA at 1-800-FDA-1088. Where should I keep my medication? Keep out of the reach of children and pets. Store at room temperature between 20 and 25 degrees C (68 and 77 degrees F), away from direct sunlight and moisture. Keep tightly closed. Throw away any unused medication after the expiration date. NOTE: This sheet is a summary. It may not cover all possible information. If you have questions about this medicine, talk to your doctor, pharmacist, or health care provider.  2024 Elsevier/Gold Standard (2021-11-04 00:00:00)

## 2022-09-16 NOTE — Progress Notes (Signed)
BH MD OP Progress Note  09/16/2022 10:01 AM [name]  MRN:  [id number]  Chief Complaint:  Chief Complaint  Patient presents with   Follow-up   Anxiety   Insomnia   Medication Refill   HPI: Dale Davis is a 47 year old Caucasian male, married, employed, lives at [place], has a history of bipolar disorder, shift work disorder, insomnia was evaluated in the office today.     Patient today appeared to be calm, pleasant.  Reports he is currently in the process of starting a new job.  Patient looks forward to that although aware that it will be an adjustment.  Continues to have sleep issues when he does not take the temazepam.  Does take the hydroxyzine only as needed and that helps.  Denies side effects to this medications except that when he takes the hydroxyzine too often he has noticed ' oral pain'.  Does take the armodafinil mostly at 75 mg during the day.  Long-term plan to taper off of it however since he is currently going through a job change would like to stay on this dosage for now.  Discussed trial of Wellbutrin in the future to help with energy and focus during the day.  Patient would like to give it more time and will let this provider know if interested.  Denies any significant mood swings.  Denies any mania or depression symptoms.  Patient denies any suicidality, homicidality or perceptual disturbances.  Patient reports he and his wife are planning to take a trip to the beach.  Looks forward to that.  Patient denies any other concerns today.  Visit Diagnosis:    ICD-10-CM   1. Bipolar disorder, in full remission, most recent episode mixed (HCC)  F31.78     2. 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 Hypersomnia  G47.10       Past Psychiatric History: I have reviewed past psychiatric history from progress note on 06/26/2018.  Past trials of medications like Symbyax, trazodone, Klonopin, Wellbutrin, Lunesta, Adderall, Chantix, Prozac, propranolol, Zyprexa,  Ambien, Sonata.  Past Medical History:  Past Medical History:  Diagnosis Date   Anxiety    Depression    History of kidney stones    Kidney stones    Kidney stones     Past Surgical History:  Procedure Laterality Date   CHOLECYSTECTOMY N/A 04/26/2021   Procedure: LAPAROSCOPIC CHOLECYSTECTOMY;  Surgeon: Lewie Chamber, DO;  Location: [facility];  Service: General;  Laterality: N/A;   COLONOSCOPY WITH PROPOFOL N/A 08/24/2021   Procedure: COLONOSCOPY WITH PROPOFOL;  Surgeon: Wyline Mood, MD;  Location[facility];  Service: Gastroenterology;  Laterality: N/A;   ERCP N/A 04/24/2021   Procedure: ENDOSCOPIC RETROGRADE CHOLANGIOPANCREATOGRAPHY (ERCP);  Surgeon: Malissa Hippo, MD;  Location: [facility];  Service: Gastroenterology;  Laterality: N/A;   ESOPHAGEAL DILATION N/A 04/24/2021   Procedure: ESOPHAGEAL DILATION;  Surgeon: Malissa Hippo, MD;  Location: [facility];  Service: Gastroenterology;  Laterality: N/A;   ESOPHAGOGASTRODUODENOSCOPY (EGD) WITH PROPOFOL N/A 04/24/2021   Procedure: ESOPHAGOGASTRODUODENOSCOPY (EGD) WITH PROPOFOL;  Surgeon: Malissa Hippo, MD;  Location: [facility];  Service: Gastroenterology;  Laterality: N/A;   TOE AMPUTATION Right 03/29/2015   big toe    Family Psychiatric History: I have reviewed family psychiatric history from progress note on 06/26/2018.  Family History:  Family History  Problem Relation Age of Onset   Kidney failure Father     Social History: I have reviewed social history from  progress note on 06/26/2018. Social History   Socioeconomic History   Marital status: Married    Spouse name: Sue Lush   Number of children: 0   Years of education: Not on file   Highest education level: Associate degree: occupational, Scientist, product/process development, or vocational program  Occupational History   Occupation: Psychologist, educational: Production assistant, radio FOR SELF EMPLOYED  Tobacco Use   Smoking status: Never   Smokeless tobacco: Former    Types: Chew    Quit date: 06/2020   Vaping Use   Vaping status: Never Used  Substance and Sexual Activity   Alcohol use: Yes    Alcohol/week: 1.0 standard drink of alcohol    Types: 1 Standard drinks or equivalent per week    Comment: occassional   Drug use: No   Sexual activity: Yes  Other Topics Concern   Not on file  Social History Narrative   Not on file   Social Determinants of Health   Financial Resource Strain: Medium Risk (07/19/2022)   Overall Financial Resource Strain (CARDIA)    Difficulty of Paying Living Expenses: Somewhat hard  Food Insecurity: No Food Insecurity (07/19/2022)   Hunger Vital Sign    Worried About Running Out of Food in the Last Year: Never true    Ran Out of Food in the Last Year: Never true  Transportation Needs: No Transportation Needs (07/19/2022)   PRAPARE - Administrator, Civil Service (Medical): No    Lack of Transportation (Non-Medical): No  Physical Activity: Insufficiently Active (07/19/2022)   Exercise Vital Sign    Days of Exercise per Week: 3 days    Minutes of Exercise per Session: 10 min  Stress: No Stress Concern Present (07/19/2022)   [employer] of Occupational Health - Occupational Stress Questionnaire    Feeling of Stress : Not at all  Social Connections: Unknown (07/19/2022)   Social Connection and Isolation Panel [NHANES]    Frequency of Communication with Friends and Family: Patient declined    Frequency of Social Gatherings with Friends and Family: Patient declined    Attends Religious Services: Patient declined    Database administrator or Organizations: Patient declined    Attends Banker Meetings: Not on file    Marital Status: Married    Allergies:  Allergies  Allergen Reactions   Flomax [Tamsulosin Hcl] Rash    Metabolic Disorder Labs: Lab Results  Component Value Date   HGBA1C 5.6 07/19/2022   MPG 114 07/19/2022   MPG 108 02/11/2020   Lab Results  Component Value Date   PROLACTIN 10.7 06/06/2020   PROLACTIN  6.5 07/15/2019   Lab Results  Component Value Date   CHOL 190 07/19/2022   TRIG 79 07/19/2022   HDL 49 07/19/2022   CHOLHDL 3.9 07/19/2022   VLDL 28 04/23/2021   LDLCALC 123 (H) 07/19/2022   LDLCALC 166 (H) 03/08/2022   Lab Results  Component Value Date   TSH 1.49 06/24/2019   TSH 1.830 07/09/2018    Therapeutic Level Labs: No results found for: "LITHIUM" No results found for: "VALPROATE" No results found for: "CBMZ"  Current Medications: Current Outpatient Medications  Medication Sig Dispense Refill   Armodafinil 150 MG tablet TAKE 1/2 TABLET BY MOUTH DAILY. STRENGTH: 150 MG 15 tablet 2   hydrOXYzine (VISTARIL) 25 MG capsule Take 25-50 mg by mouth at bedtime as needed for anxiety.     mupirocin ointment (BACTROBAN) 2 % Apply 1 Application topically 2 (two) times  daily. 22 g 0   temazepam (RESTORIL) 30 MG capsule Take 1 capsule (30 mg total) by mouth at bedtime. TAKE 1 CAPSULE BY MOUTH AT BEDTIME AS NEEDED FOR SLEEP. 30 capsule 0   No current facility-administered medications for this visit.     Musculoskeletal: Strength & Muscle Tone: within normal limits Gait & Station: normal Patient leans: N/A  Psychiatric Specialty Exam: Review of Systems  Psychiatric/Behavioral:  Positive for sleep disturbance.     Blood pressure 112/82, pulse 80, temperature 98.3 F (36.8 C), temperature source Temporal, height 5\' 11"  (1.803 m), weight 202 lb 3.2 oz (91.7 kg), SpO2 98%.Body mass index is 28.2 kg/m.  General Appearance: Fairly Groomed  Eye Contact:  Fair  Speech:  Clear and Coherent  Volume:  Normal  Mood:  Euthymic  Affect:  Congruent  Thought Process:  Goal Directed and Descriptions of Associations: Intact  Orientation:  Full (Time, Place, and Person)  Thought Content: Logical   Suicidal Thoughts:  No  Homicidal Thoughts:  No  Memory:  Immediate;   Fair Recent;   Fair Remote;   Fair  Judgement:  Fair  Insight:  Fair  Psychomotor Activity:  Normal  Concentration:   Concentration: Fair and Attention Span: Fair  Recall:  [employer] of Knowledge: Fair  Language: Fair  Akathisia:  No  Handed:  Right  AIMS (if indicated): not done  Assets:  Communication Skills Desire for Improvement Housing Social Support  ADL's:  Intact  Cognition: WNL  Sleep:   Does report hypersomnia on and off during the day currently on medications   Screenings: AIMS    Flowsheet Row Office Visit from 08/16/2020 in [facility]  AIMS Total Score 0      GAD-7    Flowsheet Row Office Visit from 09/16/2022 in [facility] Office Visit from 07/19/2022 in [facility] Office Visit from 02/21/2022 in [facility] Video Visit from 01/23/2022 in [facility] Video Visit from 11/14/2021 in [facility]  Total GAD-7 Score 0 0 0 0 0      PHQ2-9    Flowsheet Row Office Visit from 09/16/2022 in [facility] Office Visit from 07/19/2022 in [facility] Office Visit from 02/21/2022 in [facility] Video Visit from 01/23/2022 in [facility] Office Visit from 01/01/2022 in [facility]  PHQ-2 Total Score 0 0 0 0 0  PHQ-9 Total Score -- -- 0 -- --      Flowsheet Row Office Visit from 09/16/2022 in [facility] Video Visit from 05/21/2022 in [facility] Video Visit from 01/23/2022 in [facility]  C-SSRS RISK CATEGORY No Risk No Risk No Risk        Assessment and Plan: JAEDEN MESSER is a 47 year old Caucasian male, married, employed, lives in [place], has a history of bipolar disorder, shift work  disorder, insomnia, hypersomnia was evaluated in office today.  Patient is currently stable on the current medication regimen although is interested in coming off of the armodafinil however pending sleep study.  Plan as noted below.   Plan Bipolar disorder in remission Continue hydroxyzine 25-50 mg p.o. nightly as needed.  Shift work disorder-stable Patient is currently taking armodafinil 150  mg-half to 1 tablet daily. Plan to taper off gradually. Will consider adding Wellbutrin low dosage in the morning-provided education, printed out information and gave to patient. Pending sleep study-patient encouraged to get it done. Reviewed [place] PMP AWARxE  Insomnia/hypersomnia-improving Pending sleep study-patient encouraged to get it done.  Follow-up in clinic in 5 months or sooner if needed.   Collaboration of Care: Collaboration of Care: Other patient encouraged to get a sleep study completed.  Patient/Guardian was advised Release of Information must be obtained prior to any record release in order to collaborate their care with an outside provider. Patient/Guardian was advised if they have not already done so to contact the registration department to sign all necessary forms in order for [place] to release information regarding their care.   Consent: Patient/Guardian gives verbal consent for treatment and assignment of benefits for services provided during this visit. Patient/Guardian expressed understanding and agreed to proceed.   This note was generated in part or whole with voice recognition software. Voice recognition is usually quite accurate but there are transcription errors that can and very often do occur. I apologize for any typographical errors that were not detected and corrected.    Jomarie Longs, MD 09/16/2022, 10:01 AM

## 2022-10-12 ENCOUNTER — Other Ambulatory Visit (HOSPITAL_COMMUNITY): Payer: Self-pay | Admitting: Psychiatry

## 2022-10-12 DIAGNOSIS — G4726 Circadian rhythm sleep disorder, shift work type: Secondary | ICD-10-CM

## 2022-10-15 ENCOUNTER — Telehealth: Payer: Self-pay

## 2022-10-15 DIAGNOSIS — G4726 Circadian rhythm sleep disorder, shift work type: Secondary | ICD-10-CM

## 2022-10-15 MED ORDER — TEMAZEPAM 30 MG PO CAPS
30.0000 mg | ORAL_CAPSULE | Freq: Every day | ORAL | 2 refills | Status: DC
Start: 2022-10-15 — End: 2023-04-08

## 2022-10-15 NOTE — Telephone Encounter (Signed)
pt called states that he needs a refill of the temazepam sent to the [facility] pharmacy . pt was last seen on 7-22 next appt 12-17. pt needs enough medication to get to next appt.

## 2022-10-15 NOTE — Telephone Encounter (Signed)
I have sent temazepam to pharmacy at [facility].

## 2022-10-16 ENCOUNTER — Ambulatory Visit: Payer: 59 | Admitting: Psychiatry

## 2022-10-16 NOTE — Telephone Encounter (Signed)
Pt.notified

## 2022-10-26 ENCOUNTER — Other Ambulatory Visit: Payer: Self-pay | Admitting: Psychiatry

## 2022-10-26 DIAGNOSIS — G4726 Circadian rhythm sleep disorder, shift work type: Secondary | ICD-10-CM

## 2022-11-14 ENCOUNTER — Other Ambulatory Visit (HOSPITAL_COMMUNITY): Payer: Self-pay

## 2023-02-11 ENCOUNTER — Ambulatory Visit: Payer: 59 | Admitting: Psychiatry

## 2023-02-25 ENCOUNTER — Encounter: Payer: Managed Care, Other (non HMO) | Admitting: Internal Medicine

## 2023-04-08 ENCOUNTER — Encounter: Payer: Self-pay | Admitting: Psychiatry

## 2023-04-08 ENCOUNTER — Telehealth (INDEPENDENT_AMBULATORY_CARE_PROVIDER_SITE_OTHER): Payer: Managed Care, Other (non HMO) | Admitting: Psychiatry

## 2023-04-08 DIAGNOSIS — G4726 Circadian rhythm sleep disorder, shift work type: Secondary | ICD-10-CM

## 2023-04-08 DIAGNOSIS — F3178 Bipolar disorder, in full remission, most recent episode mixed: Secondary | ICD-10-CM | POA: Diagnosis not present

## 2023-04-08 NOTE — Progress Notes (Unsigned)
Virtual Visit via Video Note  I connected with Dale Davis on 04/08/23 at  4:00 PM EST by a video enabled telemedicine application and verified that I am speaking with the correct person using two identifiers.  Location Provider Location : [facility] Patient Location : Car  Participants: Patient , Provider    I discussed the limitations of evaluation and management by telemedicine and the availability of in person appointments. The patient expressed understanding and agreed to proceed.   I discussed the assessment and treatment plan with the patient. The patient was provided an opportunity to ask questions and all were answered. The patient agreed with the plan and demonstrated an understanding of the instructions.   The patient was advised to call back or seek an in-person evaluation if the symptoms worsen or if the condition fails to improve as anticipated.   BH MD OP Progress Note  04/09/2023 2:15 PM Dale Davis  MRN:  [id number]  Chief Complaint:  Chief Complaint  Patient presents with   Follow-up   Anxiety   Insomnia   Medication Refill   HPI: Dale Davis is a 48 year old Caucasian male, married, employed, lives at [place], has a history of bipolar disorder, shift work disorder, insomnia was evaluated by telemedicine today.  He has discontinued all his medications since approximately August 2024, including armodafinil, hydroxyzine as needed, and temazepam. There has been no recurrence of symptoms such as sleep issues, mood swings, or anxiety that initially led to the prescription of these medications.  His sleep remains suboptimal but not significantly different from when he was on medication. Maintaining a consistent sleep pattern helps, although he occasionally experiences nights of poor sleep. He has not undergone a sleep study and does not feel it is necessary unless his wife notices symptoms like waking up and gagging.  He reports a weight loss of  approximately 20 pounds, which he attributes to a reduction in snoring. His current weight is around 202-203 pounds.  Denies suicidal thoughts, depression, anxiety, or lack of motivation. He confirms functioning well in his work and daily life.  Visit Diagnosis:    ICD-10-CM   1. Bipolar disorder, in full remission, most recent episode mixed (HCC)  F31.78     2. 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  Past trials of medications like Symbyax, trazodone, Klonopin, Wellbutrin, Lunesta, Adderall, Chantix, Prozac, propranolol, Zyprexa, Ambien, send negative.  Past Medical History:  Past Medical History:  Diagnosis Date   Anxiety    Depression    History of kidney stones    Kidney stones    Kidney stones     Past Surgical History:  Procedure Laterality Date   CHOLECYSTECTOMY N/A 04/26/2021   Procedure: LAPAROSCOPIC CHOLECYSTECTOMY;  Surgeon: Lewie Chamber, DO;  Location: [facility];  Service: General;  Laterality: N/A;   COLONOSCOPY WITH PROPOFOL N/A 08/24/2021   Procedure: COLONOSCOPY WITH PROPOFOL;  Surgeon: Wyline Mood, MD;  Location[facility] ENDOSCOPY;  Service: Gastroenterology;  Laterality: N/A;   ERCP N/A 04/24/2021   Procedure: ENDOSCOPIC RETROGRADE CHOLANGIOPANCREATOGRAPHY (ERCP);  Surgeon: Malissa Hippo, MD;  Location: [facility];  Service: Gastroenterology;  Laterality: N/A;   ESOPHAGEAL DILATION N/A 04/24/2021   Procedure: ESOPHAGEAL DILATION;  Surgeon: Malissa Hippo, MD;  Location: [facility];  Service: Gastroenterology;  Laterality: N/A;   ESOPHAGOGASTRODUODENOSCOPY (EGD) WITH PROPOFOL N/A 04/24/2021   Procedure: ESOPHAGOGASTRODUODENOSCOPY (EGD) WITH  PROPOFOL;  Surgeon: Malissa Hippo, MD;  Location: [facility];  Service: Gastroenterology;  Laterality: N/A;   TOE AMPUTATION Right 03/29/2015   big toe    Family Psychiatric History: I have reviewed family psychiatric history from  progress note on 06/26/2018.  Family History:  Family History  Problem Relation Age of Onset   Kidney failure Father     Social History: I have reviewed social history from progress note on 06/26/2018. Social History   Socioeconomic History   Marital status: Married    Spouse name: Sue Lush   Number of children: 0   Years of education: Not on file   Highest education level: Associate degree: occupational, Scientist, product/process development, or vocational program  Occupational History   Occupation: Psychologist, educational: Production assistant, radio FOR SELF EMPLOYED  Tobacco Use   Smoking status: Never   Smokeless tobacco: Former    Types: Chew    Quit date: 06/2020  Vaping Use   Vaping status: Never Used  Substance and Sexual Activity   Alcohol use: Yes    Alcohol/week: 1.0 standard drink of alcohol    Types: 1 Standard drinks or equivalent per week    Comment: occassional   Drug use: No   Sexual activity: Yes  Other Topics Concern   Not on file  Social History Narrative   Not on file   Social Drivers of Health   Financial Resource Strain: Medium Risk (07/19/2022)   Overall Financial Resource Strain (CARDIA)    Difficulty of Paying Living Expenses: Somewhat hard  Food Insecurity: No Food Insecurity (07/19/2022)   Hunger Vital Sign    Worried About Running Out of Food in the Last Year: Never true    Ran Out of Food in the Last Year: Never true  Transportation Needs: No Transportation Needs (07/19/2022)   PRAPARE - Administrator, Civil Service (Medical): No    Lack of Transportation (Non-Medical): No  Physical Activity: Insufficiently Active (07/19/2022)   Exercise Vital Sign    Days of Exercise per Week: 3 days    Minutes of Exercise per Session: 10 min  Stress: No Stress Concern Present (07/19/2022)   [employer] of Occupational Health - Occupational Stress Questionnaire    Feeling of Stress : Not at all  Social Connections: Unknown (07/19/2022)   Social Connection and Isolation Panel  [NHANES]    Frequency of Communication with Friends and Family: Patient declined    Frequency of Social Gatherings with Friends and Family: Patient declined    Attends Religious Services: Patient declined    Database administrator or Organizations: Patient declined    Attends Banker Meetings: Not on file    Marital Status: Married    Allergies:  Allergies  Allergen Reactions   Flomax [Tamsulosin Hcl] Rash    Metabolic Disorder Labs: Lab Results  Component Value Date   HGBA1C 5.6 07/19/2022   MPG 114 07/19/2022   MPG 108 02/11/2020   Lab Results  Component Value Date   PROLACTIN 10.7 06/06/2020   PROLACTIN 6.5 07/15/2019   Lab Results  Component Value Date   CHOL 190 07/19/2022   TRIG 79 07/19/2022   HDL 49 07/19/2022   CHOLHDL 3.9 07/19/2022   VLDL 28 04/23/2021   LDLCALC 123 (H) 07/19/2022   LDLCALC 166 (H) 03/08/2022   Lab Results  Component Value Date   TSH 1.49 06/24/2019   TSH 1.830 07/09/2018    Therapeutic Level Labs: No results found for: "LITHIUM"  No results found for: "VALPROATE" No results found for: "CBMZ"  Current Medications: Current Outpatient Medications  Medication Sig Dispense Refill   mupirocin ointment (BACTROBAN) 2 % Apply 1 Application topically 2 (two) times daily. 22 g 0   No current facility-administered medications for this visit.     Musculoskeletal: Strength & Muscle Tone:  UTA Gait & Station:  Seated Patient leans: N/A  Psychiatric Specialty Exam: Review of Systems  Psychiatric/Behavioral:  Positive for sleep disturbance.     There were no vitals taken for this visit.There is no height or weight on file to calculate BMI.  General Appearance: Fairly Groomed  Eye Contact:  Fair  Speech:  Clear and Coherent  Volume:  Normal  Mood:  Euthymic  Affect:  Congruent  Thought Process:  Goal Directed and Descriptions of Associations: Intact  Orientation:  Full (Time, Place, and Person)  Thought Content: Logical    Suicidal Thoughts:  No  Homicidal Thoughts:  No  Memory:  Immediate;   Fair Recent;   Fair Remote;   Fair  Judgement:  Fair  Insight:  Fair  Psychomotor Activity:  Normal  Concentration:  Concentration: Fair and Attention Span: Fair  Recall:  [employer] of Knowledge: Fair  Language: Fair  Akathisia:  No  Handed:  Right  AIMS (if indicated): not done  Assets:  Desire for Improvement Housing Social Support Transportation  ADL's:  Intact  Cognition: WNL  Sleep:   restless, overall ok as long as he keeps a routine    Screenings: AIMS    Flowsheet Row Office Visit from 08/16/2020 in [facility]  AIMS Total Score 0      GAD-7    Flowsheet Row Office Visit from 09/16/2022 in [facility] Office Visit from 07/19/2022 in [facility] Office Visit from 02/21/2022 in [facility] Video Visit from 01/23/2022 in [facility] Video Visit from 11/14/2021 in [facility]  Total GAD-7 Score 0 0 0 0 0      PHQ2-9    Flowsheet Row Video Visit from 04/08/2023 in [facility] Office Visit from 09/16/2022 in [facility] Office Visit from 07/19/2022 in [facility] Office Visit from 02/21/2022 in [facility] Video Visit from 01/23/2022 in [facility]  PHQ-2 Total Score 0 0 0 0 0  PHQ-9 Total Score -- -- -- 0 --      Flowsheet Row Video Visit from 04/08/2023 in [facility] Office Visit from 09/16/2022 in [facility] Video Visit from 05/21/2022 in [facility]  C-SSRS RISK CATEGORY No Risk No Risk  No Risk        Assessment and Plan: Dale Davis is a 48 year old Caucasian male, married, employed, lives in [place], has a history of bipolar disorder shift work disorder, insomnia, hypersomnia was evaluated by telemedicine today.  Patient is currently noncompliant with all of his medications and is not interested in going back on any of his medications at this time.  Discussed assessment and plan as noted below.  Bipolar disorder in remission Currently denies any significant mood symptoms. - Currently not on any medications.  Sleep problems-stable Does report ongoing sleep issues although reports as long as he is  able to keep a good routine he is able to sleep through the night. - Currently not interested in medication management. - Previous referral to sleep study-not interested.  Follow-up in clinic as needed.  Collaboration of Care: Collaboration of Care: Primary Care Provider AEB patient encouraged to follow up with primary care provider.  Patient/Guardian was advised Release of Information must be obtained prior to any record release in order to collaborate their care with an outside provider. Patient/Guardian was advised if they have not already done so to contact the registration department to sign all necessary forms in order for [place] to release information regarding their care.   Consent: Patient/Guardian gives verbal consent for treatment and assignment of benefits for services provided during this visit. Patient/Guardian expressed understanding and agreed to proceed.   This note was generated in part or whole with voice recognition software. Voice recognition is usually quite accurate but there are transcription errors that can and very often do occur. I apologize for any typographical errors that were not detected and corrected.    Jomarie Longs, MD 04/09/2023, 2:15 PM

## 2023-09-18 NOTE — Progress Notes (Unsigned)
 Subjective:    Patient ID: Dale Davis, male    DOB: [birth year], 48 y.o.   MRN: [id number]  HPI  Patient presents to clinic today for his annual exam.  Flu: Never Tetanus: 10/2015 COVID: Pfizer x 2 Colon screening: 07/2021 Vision screening: as needed Dentist: biannually  Diet: He does eat meat. He consumes some fruits and veggies. He does eat some fried foods. He drinks mostly soda. Exercise: None  Review of Systems  Past Medical History:  Diagnosis Date   Anxiety    Depression    History of kidney stones    Kidney stones    Kidney stones     Current Outpatient Medications  Medication Sig Dispense Refill   mupirocin  ointment (BACTROBAN ) 2 % Apply 1 Application topically 2 (two) times daily. 22 g 0   No current facility-administered medications for this visit.    Allergies  Allergen Reactions   Flomax  [Tamsulosin  Hcl] Rash    Family History  Problem Relation Age of Onset   Kidney failure Father     Social History   Socioeconomic History   Marital status: Married    Spouse name: Alfonso   Number of children: 0   Years of education: Not on file   Highest education level: Associate degree: occupational, Scientist, product/process development, or vocational program  Occupational History   Occupation: Psychologist, educational: Production assistant, radio FOR SELF EMPLOYED  Tobacco Use   Smoking status: Never   Smokeless tobacco: Former    Types: Chew    Quit date: 06/2020  Vaping Use   Vaping status: Never Used  Substance and Sexual Activity   Alcohol use: Yes    Alcohol/week: 1.0 standard drink of alcohol    Types: 1 Standard drinks or equivalent per week    Comment: occassional   Drug use: No   Sexual activity: Yes  Other Topics Concern   Not on file  Social History Narrative   Not on file   Social Drivers of Health   Financial Resource Strain: Patient Declined (09/15/2023)   Overall Financial Resource Strain (CARDIA)    Difficulty of Paying Living Expenses: Patient declined   Food Insecurity: Patient Declined (09/15/2023)   Hunger Vital Sign    Worried About Running Out of Food in the Last Year: Patient declined    Ran Out of Food in the Last Year: Patient declined  Transportation Needs: Patient Declined (09/15/2023)   PRAPARE - Administrator, Civil Service (Medical): Patient declined    Lack of Transportation (Non-Medical): Patient declined  Physical Activity: Insufficiently Active (09/15/2023)   Exercise Vital Sign    Days of Exercise per Week: 7 days    Minutes of Exercise per Session: 10 min  Stress: No Stress Concern Present (09/15/2023)   [employer] of Occupational Health - Occupational Stress Questionnaire    Feeling of Stress: Only a little  Social Connections: Unknown (09/15/2023)   Social Connection and Isolation Panel    Frequency of Communication with Friends and Family: Patient declined    Frequency of Social Gatherings with Friends and Family: Patient declined    Attends Religious Services: Patient declined    Database administrator or Organizations: No    Attends Engineer, structural: Not on file    Marital Status: Married  Catering manager Violence: Not At Risk (06/26/2018)   Humiliation, Afraid, Rape, and Kick questionnaire    Fear of Current or Ex-Partner: No    Emotionally Abused: No  Physically Abused: No    Sexually Abused: No     Constitutional: Denies fever, malaise, fatigue, headache or abrupt weight changes.  HEENT: Denies eye pain, eye redness, ear pain, ringing in the ears, wax buildup, runny nose, nasal congestion, bloody nose, or sore throat. Respiratory: Denies difficulty breathing, shortness of breath, cough or sputum production.   Cardiovascular: Denies chest pain, chest tightness, palpitations or swelling in the hands or feet.  Gastrointestinal: Denies abdominal pain, bloating, constipation, diarrhea or blood in the stool.  GU: Patient reports erectile dysfunction.  Denies urgency, frequency,  pain with urination, burning sensation, blood in urine, odor or discharge. Musculoskeletal: Denies decrease in range of motion, difficulty with gait, muscle pain or joint pain and swelling.  Skin: Denies redness, rashes, lesions or ulcercations.  Neurological: Patient reports insomnia.  Denies dizziness, difficulty with memory, difficulty with speech or problems with balance and coordination.  Psych: Patient has a history of depression.  Denies anxiety, SI/HI.  No other specific complaints in a complete review of systems (except as listed in HPI above).     Objective:   Physical Exam  There were no vitals taken for this visit.  Wt Readings from Last 3 Encounters:  07/19/22 203 lb (92.1 kg)  02/21/22 215 lb (97.5 kg)  01/01/22 215 lb (97.5 kg)    General: Appears his stated age, overweight, in NAD. Skin: Warm, dry and intact.  HEENT: Head: normal shape and size; Eyes: sclera white, no icterus, conjunctiva pink, PERRLA and EOMs intact;  Neck:  Neck supple, trachea midline. No masses, lumps or thyromegaly present.  Cardiovascular: Normal rate and rhythm. S1,S2 noted.  No murmur, rubs or gallops noted. No JVD or BLE edema.  Pulmonary/Chest: Normal effort and positive vesicular breath sounds. No respiratory distress. No wheezes, rales or ronchi noted.  Abdomen: Normal bowel sounds.  Musculoskeletal: Strength 5/5 BUE/BLE.  No difficulty with gait.  Neurological: Alert and oriented. Cranial nerves II-XII grossly intact. Coordination normal.  Psychiatric: Mood and affect normal. Behavior is normal. Judgment and thought content normal.   BMET    Component Value Date/Time   NA 141 07/19/2022 0941   NA 142 03/08/2022 1259   K 3.8 07/19/2022 0941   CL 104 07/19/2022 0941   CO2 29 07/19/2022 0941   GLUCOSE 94 07/19/2022 0941   BUN 21 07/19/2022 0941   BUN 15 03/08/2022 1259   CREATININE 0.96 07/19/2022 0941   CALCIUM  9.2 07/19/2022 0941   GFRNONAA >60 04/27/2021 0525   GFRAA 89  07/09/2018 0836    Lipid Panel     Component Value Date/Time   CHOL 190 07/19/2022 0941   CHOL 227 (H) 03/08/2022 1259   TRIG 79 07/19/2022 0941   HDL 49 07/19/2022 0941   HDL 40 03/08/2022 1259   CHOLHDL 3.9 07/19/2022 0941   VLDL 28 04/23/2021 0325   LDLCALC 123 (H) 07/19/2022 0941    CBC    Component Value Date/Time   WBC 4.6 07/19/2022 0941   RBC 4.80 07/19/2022 0941   HGB 14.8 07/19/2022 0941   HGB 13.7 03/08/2022 1259   HCT 43.8 07/19/2022 0941   HCT 39.9 03/08/2022 1259   PLT 183 07/19/2022 0941   PLT 178 03/08/2022 1259   MCV 91.3 07/19/2022 0941   MCV 90 03/08/2022 1259   MCH 30.8 07/19/2022 0941   MCHC 33.8 07/19/2022 0941   RDW 13.1 07/19/2022 0941   RDW 12.4 03/08/2022 1259   LYMPHSABS 0.8 07/09/2018 0836   MONOABS 0.8  04/18/2015 1930   EOSABS 0.1 07/09/2018 0836   BASOSABS 0.0 07/09/2018 0836    Hgb A1C Lab Results  Component Value Date   HGBA1C 5.6 07/19/2022            Assessment & Plan:   Preventative Health Maintenance:  Encouraged him to get a flu shot in the fall Tetanus UTD Encouraged him to get his COVID booster Colon screening UTD Encouraged him to consume a balanced diet and exercise regimen Advised him to see an eye doctor and dentist annually We will check CBC, c-Met, lipid and A1c today  RTC in 6 months, follow-up chronic conditions Angeline Laura, NP

## 2023-09-19 ENCOUNTER — Ambulatory Visit (INDEPENDENT_AMBULATORY_CARE_PROVIDER_SITE_OTHER): Payer: Self-pay | Admitting: Internal Medicine

## 2023-09-19 ENCOUNTER — Encounter: Payer: Self-pay | Admitting: Internal Medicine

## 2023-09-19 VITALS — BP 124/82 | HR 74 | Ht 71.0 in | Wt 217.1 lb

## 2023-09-19 DIAGNOSIS — E782 Mixed hyperlipidemia: Secondary | ICD-10-CM | POA: Diagnosis not present

## 2023-09-19 DIAGNOSIS — E6609 Other obesity due to excess calories: Secondary | ICD-10-CM

## 2023-09-19 DIAGNOSIS — R739 Hyperglycemia, unspecified: Secondary | ICD-10-CM | POA: Diagnosis not present

## 2023-09-19 DIAGNOSIS — Z0001 Encounter for general adult medical examination with abnormal findings: Secondary | ICD-10-CM

## 2023-09-19 DIAGNOSIS — E66811 Obesity, class 1: Secondary | ICD-10-CM

## 2023-09-19 DIAGNOSIS — Z6830 Body mass index (BMI) 30.0-30.9, adult: Secondary | ICD-10-CM

## 2023-09-19 NOTE — Patient Instructions (Signed)
 Health Maintenance, Male  Adopting a healthy lifestyle and getting preventive care are important in promoting health and wellness. Ask your health care provider about:  The right schedule for you to have regular tests and exams.  Things you can do on your own to prevent diseases and keep yourself healthy.  What should I know about diet, weight, and exercise?  Eat a healthy diet    Eat a diet that includes plenty of vegetables, fruits, low-fat dairy products, and lean protein.  Do not eat a lot of foods that are high in solid fats, added sugars, or sodium.  Maintain a healthy weight  Body mass index (BMI) is a measurement that can be used to identify possible weight problems. It estimates body fat based on height and weight. Your health care provider can help determine your BMI and help you achieve or maintain a healthy weight.  Get regular exercise  Get regular exercise. This is one of the most important things you can do for your health. Most adults should:  Exercise for at least 150 minutes each week. The exercise should increase your heart rate and make you sweat (moderate-intensity exercise).  Do strengthening exercises at least twice a week. This is in addition to the moderate-intensity exercise.  Spend less time sitting. Even light physical activity can be beneficial.  Watch cholesterol and blood lipids  Have your blood tested for lipids and cholesterol at 49 years of age, then have this test every 5 years.  You may need to have your cholesterol levels checked more often if:  Your lipid or cholesterol levels are high.  You are older than 49 years of age.  You are at high risk for heart disease.  What should I know about cancer screening?  Many types of cancers can be detected early and may often be prevented. Depending on your health history and family history, you may need to have cancer screening at various ages. This may include screening for:  Colorectal cancer.  Prostate cancer.  Skin cancer.  Lung  cancer.  What should I know about heart disease, diabetes, and high blood pressure?  Blood pressure and heart disease  High blood pressure causes heart disease and increases the risk of stroke. This is more likely to develop in people who have high blood pressure readings or are overweight.  Talk with your health care provider about your target blood pressure readings.  Have your blood pressure checked:  Every 3-5 years if you are 9-95 years of age.  Every year if you are 85 years old or older.  If you are between the ages of 29 and 29 and are a current or former smoker, ask your health care provider if you should have a one-time screening for abdominal aortic aneurysm (AAA).  Diabetes  Have regular diabetes screenings. This checks your fasting blood sugar level. Have the screening done:  Once every three years after age 23 if you are at a normal weight and have a low risk for diabetes.  More often and at a younger age if you are overweight or have a high risk for diabetes.  What should I know about preventing infection?  Hepatitis B  If you have a higher risk for hepatitis B, you should be screened for this virus. Talk with your health care provider to find out if you are at risk for hepatitis B infection.  Hepatitis C  Blood testing is recommended for:  Everyone born from [birth year] through 1966.  Anyone  with known risk factors for hepatitis C.  Sexually transmitted infections (STIs)  You should be screened each year for STIs, including gonorrhea and chlamydia, if:  You are sexually active and are younger than 49 years of age.  You are older than 49 years of age and your health care provider tells you that you are at risk for this type of infection.  Your sexual activity has changed since you were last screened, and you are at increased risk for chlamydia or gonorrhea. Ask your health care provider if you are at risk.  Ask your health care provider about whether you are at high risk for HIV. Your health care provider  may recommend a prescription medicine to help prevent HIV infection. If you choose to take medicine to prevent HIV, you should first get tested for HIV. You should then be tested every 3 months for as long as you are taking the medicine.  Follow these instructions at home:  Alcohol use  Do not drink alcohol if your health care provider tells you not to drink.  If you drink alcohol:  Limit how much you have to 0-2 drinks a day.  Know how much alcohol is in your drink. In the [place] one drink equals one 12 oz bottle of beer (355 mL), one 5 oz glass of wine (148 mL), or one 1 oz glass of hard liquor (44 mL).  Lifestyle  Do not use any products that contain nicotine or tobacco. These products include cigarettes, chewing tobacco, and vaping devices, such as e-cigarettes. If you need help quitting, ask your health care provider.  Do not use street drugs.  Do not share needles.  Ask your health care provider for help if you need support or information about quitting drugs.  General instructions  Schedule regular health, dental, and eye exams.  Stay current with your vaccines.  Tell your health care provider if:  You often feel depressed.  You have ever been abused or do not feel safe at home.  Summary  Adopting a healthy lifestyle and getting preventive care are important in promoting health and wellness.  Follow your health care provider's instructions about healthy diet, exercising, and getting tested or screened for diseases.  Follow your health care provider's instructions on monitoring your cholesterol and blood pressure.  This information is not intended to replace advice given to you by your health care provider. Make sure you discuss any questions you have with your health care provider.  Document Revised: 07/03/2020 Document Reviewed: 07/03/2020  Elsevier Patient Education  2024 [employer].

## 2023-09-19 NOTE — Assessment & Plan Note (Signed)
 Encourage diet and exercise for weight loss

## 2023-09-20 LAB — HEMOGLOBIN A1C
Hgb A1c MFr Bld: 5.5 % (ref <5.7)
Mean Plasma Glucose: 111 mg/dL
eAG (mmol/L): 6.2 mmol/L

## 2023-09-20 LAB — CBC
HCT: 45.7 % (ref 38.5–50.0)
Hemoglobin: 15.3 g/dL (ref 13.2–17.1)
MCH: 30.5 pg (ref 27.0–33.0)
MCHC: 33.5 g/dL (ref 32.0–36.0)
MCV: 91 fL (ref 80.0–100.0)
MPV: 10.8 fL (ref 7.5–12.5)
Platelets: 179 Thousand/uL (ref 140–400)
RBC: 5.02 Million/uL (ref 4.20–5.80)
RDW: 13.3 % (ref 11.0–15.0)
WBC: 4.5 Thousand/uL (ref 3.8–10.8)

## 2023-09-20 LAB — LIPID PANEL
Cholesterol: 261 mg/dL — ABNORMAL HIGH (ref <200)
HDL: 43 mg/dL (ref >=40)
LDL Cholesterol (Calc): 180 mg/dL — ABNORMAL HIGH
Non-HDL Cholesterol (Calc): 218 mg/dL — ABNORMAL HIGH (ref <130)
Total CHOL/HDL Ratio: 6.1 (calc) — ABNORMAL HIGH (ref <5.0)
Triglycerides: 214 mg/dL — ABNORMAL HIGH (ref <150)

## 2023-09-20 LAB — COMPREHENSIVE METABOLIC PANEL WITH GFR
AG Ratio: 1.8 (calc) (ref 1.0–2.5)
ALT: 44 U/L (ref 9–46)
AST: 25 U/L (ref 10–40)
Albumin: 4.6 g/dL (ref 3.6–5.1)
Alkaline phosphatase (APISO): 58 U/L (ref 36–130)
BUN: 23 mg/dL (ref 7–25)
CO2: 29 mmol/L (ref 20–32)
Calcium: 9.8 mg/dL (ref 8.6–10.3)
Chloride: 104 mmol/L (ref 98–110)
Creat: 0.98 mg/dL (ref 0.60–1.29)
Globulin: 2.6 g/dL (ref 1.9–3.7)
Glucose, Bld: 101 mg/dL — ABNORMAL HIGH (ref 65–99)
Potassium: 4.5 mmol/L (ref 3.5–5.3)
Sodium: 141 mmol/L (ref 135–146)
Total Bilirubin: 0.6 mg/dL (ref 0.2–1.2)
Total Protein: 7.2 g/dL (ref 6.1–8.1)
eGFR: 95 mL/min/1.73m2 (ref >=60)

## 2023-09-22 ENCOUNTER — Ambulatory Visit: Payer: Self-pay | Admitting: Internal Medicine

## 2023-12-10 ENCOUNTER — Encounter (INDEPENDENT_AMBULATORY_CARE_PROVIDER_SITE_OTHER): Payer: Self-pay | Admitting: Gastroenterology

## 2024-03-01 ENCOUNTER — Ambulatory Visit
Admission: RE | Admit: 2024-03-01 | Discharge: 2024-03-01 | Disposition: A | Discharge status: Home / Self Care | Attending: Family Medicine | Admitting: Family Medicine

## 2024-03-01 VITALS — BP 111/74 | HR 89 | Temp 97.9°F | Resp 20

## 2024-03-01 DIAGNOSIS — J208 Acute bronchitis due to other specified organisms: Secondary | ICD-10-CM

## 2024-03-01 MED ORDER — AZELASTINE HCL 0.1 % NA SOLN: 1.0000 | Freq: Two times a day (BID) | NASAL | 0 refills | Status: AC

## 2024-03-01 MED ORDER — PREDNISONE 20 MG PO TABS: 40.0000 mg | ORAL_TABLET | Freq: Every day | ORAL | 0 refills | Status: AC

## 2024-03-01 MED ORDER — ALBUTEROL SULFATE HFA 108 (90 BASE) MCG/ACT IN AERS: 2.0000 | INHALATION_SPRAY | RESPIRATORY_TRACT | 0 refills | Status: AC | PRN

## 2024-03-01 MED ORDER — PROMETHAZINE-DM 6.25-15 MG/5ML PO SYRP: 5.0000 mL | ORAL_SOLUTION | Freq: Four times a day (QID) | ORAL | 0 refills | Status: AC | PRN

## 2024-03-01 NOTE — ED Triage Notes (Signed)
 Pt reports congestion, cough, headache x 3 days.

## 2024-03-01 NOTE — ED Provider Notes (Signed)
 " [facility]    CSN: [id number] Arrival date & time: 03/01/24  1246      History   Chief Complaint Chief Complaint  Patient presents with   Cough    Chest burning,  cough hard to sleep. Alot of flem - Entered by patient    HPI Dale Davis is a 49 y.o. male.   Patient presenting today with 3-day history of rhinorrhea, hacking cough, wheezing, chest tightness, headache, fatigue.  Denies fever, chest pain, shortness of breath, abdominal pain, vomiting, diarrhea.  So far trying over-the-counter cold and congestion medication with minimal relief.  No known history of chronic pulmonary disease.    Past Medical History:  Diagnosis Date   Anxiety    Depression    History of kidney stones    Kidney stones    Kidney stones     Patient Active Problem List   Diagnosis Date Noted   Neutropenia 07/19/2022   GERD (gastroesophageal reflux disease) 08/16/2021   HLD (hyperlipidemia) 02/02/2021   Class 1 obesity due to excess calories with body mass index (BMI) of 30.0 to 30.9 in adult 02/02/2021   Bipolar disorder, in full remission, most recent episode mixed 04/07/2019   Insomnia 11/17/2018    Past Surgical History:  Procedure Laterality Date   CHOLECYSTECTOMY N/A 04/26/2021   Procedure: LAPAROSCOPIC CHOLECYSTECTOMY;  Surgeon: Evonnie Dorothyann DELENA, DO;  Location: [facility];  Service: General;  Laterality: N/A;   COLONOSCOPY WITH PROPOFOL  N/A 08/24/2021   Procedure: COLONOSCOPY WITH PROPOFOL ;  Surgeon: Therisa Bi, MD;  Location: [facility];  Service: Gastroenterology;  Laterality: N/A;   ERCP N/A 04/24/2021   Procedure: ENDOSCOPIC RETROGRADE CHOLANGIOPANCREATOGRAPHY (ERCP);  Surgeon: Golda Claudis PENNER, MD;  Location: [facility];  Service: Gastroenterology;  Laterality: N/A;   ESOPHAGEAL DILATION N/A 04/24/2021   Procedure: ESOPHAGEAL DILATION;  Surgeon: Golda Claudis PENNER, MD;  Location: [facility];  Service: Gastroenterology;  Laterality: N/A;   ESOPHAGOGASTRODUODENOSCOPY  (EGD) WITH PROPOFOL  N/A 04/24/2021   Procedure: ESOPHAGOGASTRODUODENOSCOPY (EGD) WITH PROPOFOL ;  Surgeon: Golda Claudis PENNER, MD;  Location: [facility];  Service: Gastroenterology;  Laterality: N/A;   TOE AMPUTATION Right 03/29/2015   big toe       Home Medications    Prior to Admission medications  Medication Sig Start Date End Date Taking? Authorizing Provider  albuterol  (VENTOLIN  HFA) 108 (90 Base) MCG/ACT inhaler Inhale 2 puffs into the lungs every 4 (four) hours as needed. 03/01/24  Yes Stuart Vernell Norris, PA-C  azelastine  (ASTELIN ) 0.1 % nasal spray Place 1 spray into both nostrils 2 (two) times daily. Use in each nostril as directed 03/01/24  Yes Stuart Vernell Norris, PA-C  predniSONE  (DELTASONE ) 20 MG tablet Take 2 tablets (40 mg total) by mouth daily with breakfast. 03/01/24  Yes Stuart Vernell Norris, PA-C  promethazine -dextromethorphan (PROMETHAZINE -DM) 6.25-15 MG/5ML syrup Take 5 mLs by mouth 4 (four) times daily as needed. 03/01/24  Yes Stuart Vernell Norris, PA-C    Family History Family History  Problem Relation Age of Onset   Kidney failure Father     Social History Social History[1]   Allergies   Flomax  [tamsulosin  hcl]   Review of Systems Review of Systems PER HPI  Physical Exam Triage Vital Signs ED Triage Vitals  Encounter Vitals Group     BP 03/01/24 1310 111/74     Girls Systolic BP Percentile --      Girls Diastolic BP Percentile --      Boys Systolic BP Percentile --  Boys Diastolic BP Percentile --      Pulse Rate 03/01/24 1310 89     Resp 03/01/24 1310 20     Temp 03/01/24 1310 97.9 F (36.6 C)     Temp Source 03/01/24 1310 Oral     SpO2 03/01/24 1310 96 %     Weight --      Height --      Head Circumference --      Peak Flow --      Pain Score 03/01/24 1312 4     Pain Loc --      Pain Education --      Exclude from Growth Chart --    No data found.  Updated Vital Signs BP 111/74 (BP Location: Right Arm)   Pulse 89   Temp 97.9  F (36.6 C) (Oral)   Resp 20   SpO2 96%   Visual Acuity Right Eye Distance:   Left Eye Distance:   Bilateral Distance:    Right Eye Near:   Left Eye Near:    Bilateral Near:     Physical Exam Vitals and nursing note reviewed.  Constitutional:      Appearance: He is well-developed.  HENT:     Head: Atraumatic.     Right Ear: External ear normal.     Left Ear: External ear normal.     Nose: Rhinorrhea present.     Mouth/Throat:     Pharynx: Posterior oropharyngeal erythema present. No oropharyngeal exudate.  Eyes:     Conjunctiva/sclera: Conjunctivae normal.     Pupils: Pupils are equal, round, and reactive to light.  Cardiovascular:     Rate and Rhythm: Normal rate and regular rhythm.  Pulmonary:     Effort: Pulmonary effort is normal. No respiratory distress.     Breath sounds: Wheezing present. No rales.  Musculoskeletal:        General: Normal range of motion.     Cervical back: Normal range of motion and neck supple.  Lymphadenopathy:     Cervical: No cervical adenopathy.  Skin:    General: Skin is warm and dry.  Neurological:     Mental Status: He is alert and oriented to person, place, and time.  Psychiatric:        Behavior: Behavior normal.      UC Treatments / Results  Labs (all labs ordered are listed, but only abnormal results are displayed) Labs Reviewed - No data to display  EKG   Radiology No results found.  Procedures Procedures (including critical care time)  Medications Ordered in UC Medications - No data to display  Initial Impression / Assessment and Plan / UC Course  I have reviewed the triage vital signs and the nursing notes.  Pertinent labs & imaging results that were available during my care of the patient were reviewed by me and considered in my medical decision making (see chart for details).     Vital signs and exam reassuring today, suspect viral bronchitis.  Treat with prednisone , Phenergan  DM, Astelin , albuterol ,  supportive over-the-counter medications and home care.  Return for worsening or unresolving symptoms.  Final Clinical Impressions(s) / UC Diagnoses   Final diagnoses:  Viral bronchitis     Discharge Instructions      In addition to the prescribed medications, you may use humidifiers, saline sinus rinses, decongestants as needed.  Return for worsening or unresolving symptoms    ED Prescriptions     Medication Sig Dispense Auth. Provider  predniSONE  (DELTASONE ) 20 MG tablet Take 2 tablets (40 mg total) by mouth daily with breakfast. 10 tablet Stuart Vernell Norris, PA-C   promethazine -dextromethorphan (PROMETHAZINE -DM) 6.25-15 MG/5ML syrup Take 5 mLs by mouth 4 (four) times daily as needed. 100 mL Stuart Vernell Norris, PA-C   azelastine  (ASTELIN ) 0.1 % nasal spray Place 1 spray into both nostrils 2 (two) times daily. Use in each nostril as directed 30 mL Stuart Vernell Norris, PA-C   albuterol  (VENTOLIN  HFA) 108 (90 Base) MCG/ACT inhaler Inhale 2 puffs into the lungs every 4 (four) hours as needed. 18 g Stuart Vernell Norris, [place]      PDMP not reviewed this encounter.    [1]  Social History Tobacco Use   Smoking status: Never   Smokeless tobacco: Former    Types: Chew    Quit date: 06/2020  Vaping Use   Vaping status: Never Used  Substance Use Topics   Alcohol use: Yes    Alcohol/week: 1.0 standard drink of alcohol    Types: 1 Standard drinks or equivalent per week    Comment: occassional   Drug use: No     Stuart Vernell Norris, PA-C 03/01/24 1344  "

## 2024-03-01 NOTE — Discharge Instructions (Signed)
 In addition to the prescribed medications, you may use humidifiers, saline sinus rinses, decongestants as needed.  Return for worsening or unresolving symptoms

## 2024-09-23 ENCOUNTER — Encounter: Admitting: Internal Medicine
